# Patient Record
Sex: Male | Born: 1970 | Race: White | Hispanic: No | Marital: Married | Smoking: Former smoker
Health system: Southern US, Community
[De-identification: ages and names within clinical notes are randomized; demographics above are authoritative.]

## PROBLEM LIST (undated history)

## (undated) DIAGNOSIS — I10 Essential (primary) hypertension: Secondary | ICD-10-CM

## (undated) DIAGNOSIS — N189 Chronic kidney disease, unspecified: Secondary | ICD-10-CM

## (undated) DIAGNOSIS — K760 Fatty (change of) liver, not elsewhere classified: Secondary | ICD-10-CM

## (undated) DIAGNOSIS — T7840XA Allergy, unspecified, initial encounter: Secondary | ICD-10-CM

## (undated) DIAGNOSIS — E785 Hyperlipidemia, unspecified: Secondary | ICD-10-CM

## (undated) DIAGNOSIS — E119 Type 2 diabetes mellitus without complications: Secondary | ICD-10-CM

## (undated) DIAGNOSIS — J9601 Acute respiratory failure with hypoxia: Secondary | ICD-10-CM

## (undated) DIAGNOSIS — U071 COVID-19: Secondary | ICD-10-CM

## (undated) DIAGNOSIS — K859 Acute pancreatitis without necrosis or infection, unspecified: Secondary | ICD-10-CM

## (undated) HISTORY — DX: COVID-19: U07.1

## (undated) HISTORY — DX: Hyperlipidemia, unspecified: E78.5

## (undated) HISTORY — DX: Type 2 diabetes mellitus without complications: E11.9

## (undated) HISTORY — DX: Acute respiratory failure with hypoxia: J96.01

## (undated) HISTORY — DX: Acute pancreatitis without necrosis or infection, unspecified: K85.90

## (undated) HISTORY — DX: Essential (primary) hypertension: I10

## (undated) HISTORY — DX: Fatty (change of) liver, not elsewhere classified: K76.0

## (undated) HISTORY — DX: Allergy, unspecified, initial encounter: T78.40XA

---

## 2009-09-04 ENCOUNTER — Encounter: Payer: Self-pay | Admitting: Internal Medicine

## 2009-09-19 HISTORY — PX: CHOLECYSTECTOMY, LAPAROSCOPIC: SHX56

## 2009-09-24 ENCOUNTER — Encounter: Payer: Self-pay | Admitting: Internal Medicine

## 2009-09-30 ENCOUNTER — Encounter: Payer: Self-pay | Admitting: Internal Medicine

## 2009-10-01 ENCOUNTER — Encounter (INDEPENDENT_AMBULATORY_CARE_PROVIDER_SITE_OTHER): Payer: Self-pay | Admitting: *Deleted

## 2009-10-30 ENCOUNTER — Ambulatory Visit: Payer: Self-pay | Admitting: Internal Medicine

## 2009-10-30 DIAGNOSIS — R740 Nonspecific elevation of levels of transaminase and lactic acid dehydrogenase [LDH]: Secondary | ICD-10-CM

## 2010-01-27 LAB — CONVERTED CEMR LAB
ALT: 65 units/L — ABNORMAL HIGH (ref 0–53)
AST: 29 units/L (ref 0–37)
Albumin: 3.9 g/dL (ref 3.5–5.2)
Alkaline Phosphatase: 63 units/L (ref 39–117)
Bilirubin, Direct: 0.1 mg/dL (ref 0.0–0.3)
Total Bilirubin: 0.4 mg/dL (ref 0.3–1.2)
Total Protein: 7 g/dL (ref 6.0–8.3)

## 2010-09-19 DIAGNOSIS — I10 Essential (primary) hypertension: Secondary | ICD-10-CM

## 2010-09-19 DIAGNOSIS — E119 Type 2 diabetes mellitus without complications: Secondary | ICD-10-CM

## 2010-09-19 HISTORY — DX: Essential (primary) hypertension: I10

## 2010-09-19 HISTORY — DX: Type 2 diabetes mellitus without complications: E11.9

## 2010-10-19 NOTE — Assessment & Plan Note (Signed)
Summary: ELEVATED LFT'S--CH.   History of Present Illness Visit Type: Initial Consult Primary GI MD: Stan Head MD [facility] Primary Provider: Aleatha Borer, MD Chief Complaint: Patient here for further evaluation of increased Liver function tests. He denies any problems. History of Present Illness:   40 year old white man with a gastrointestinal illness before Christmas, 2010. He sought evaluation due to persistent symptoms and [facility] to [facility] The first abnormal transaminases were seen on September 04, 2009 with ALT 78 and a normal AST of 37. Subsequent testing demonstrated AST 57 and ALT 150 on September 24, 2009. On September 30, 2009 hepatitis panel for acute infectious hepatitis with a antibody IgM, the surface antigen, B. core antibody IgM, and C. virus antibodies were all negative. He has no known history of prior abnormal LFTs. He does the a large amount of fast food. Alcohol use is characterized as rare. He is not on any supplements to body build such as steroids and there is no family history of liver disease. He knows of no known prior liver problems. He has mild fasting glucose increased but is not a diabetic.spell No history of blood transfusions. He last donated blood at age 32. No history of needle sticks or injection use. The gastrointestinal viarl sxs of nausea, vomiting and diarrhea have resolved. he does not have nor did he have significant myalgias. All other GI ROS negative.          Preventive Screening-Counseling & Management  Alcohol-Tobacco     Smoking Status: current      Drug Use:  no.      Current Medications (verified): 1)  Multivitamins  Tabs (Multiple Vitamin) .... Take 1 Tablet By Mouth As Needed  Allergies (verified): No Known Drug Allergies  Past History:  Past Medical History: Elevated Liver Function Tests  Past Surgical History: Unremarkable  Family History: Family History of Breast Cancer: Mother, Aunt No  FH of Colon Cancer: Family History of Heart Disease: Uncle  Social History: Occupation: Engineer, civil (consulting) - trucking Patient currently smokes. -3/4 ppd Alcohol Use - yes-occasional - rare Daily Caffeine Use-2 cups daily Illicit Drug Use - no Smoking Status:  current Drug Use:  no  Review of Systems       The patient complains of allergy/sinus.         All other ROS negative except as per HPI.   Vital Signs:  Patient profile:   40 year old male Height:      74 inches Weight:      241.50 pounds BMI:     31.12 BSA:     2.36 Pulse rate:   76 / minute Pulse rhythm:   regular BP sitting:   150 / 90  (right arm)  Vitals Entered By: Hortense Ramal CMA Duncan Dull) (October 30, 2009 11:02 AM)  Physical Exam  General:  Well developed, well nourished, no acute distress. Eyes:  PERRLA, no icterus. Mouth:  No deformity or lesions, dentition normal. Lungs:  Clear throughout to auscultation. Heart:  Regular rate and rhythm; no murmurs, rubs,  or bruits. Abdomen:  Soft, nontender and nondistended. No masses, hepatosplenomegaly or hernias noted. Normal bowel sounds. Extremities:  No clubbing, cyanosis, edema or deformities noted. Skin:  Intact without significant lesions or rashes. Cervical Nodes:  No significant cervical or supraclavicular adenopathy.  Psych:  Alert and cooperative. Normal mood and affect.   Impression & Recommendations:  Problem # 1:  TRANSAMINASES, SERUM, ELEVATED (ICD-790.4) Assessment New The etiology  is not clear at this time. These are minor evaluations with a maximum of 3 times abnormal on January 7. One real possibility is excessive fast food intake which has been linked to abnormal transaminases. It does not appear related to alcohol. Acute hepatitis A, B, and C are excluded reliably at this time. He is mildly obese and has a mild elevation of fasting glucose so fatty liver problems could be part of this. I think reassessment of his LFTs only it is prudent at this time.  Further plans pending those results.  He will reduce his intake a fast food early try to do so. Orders: TLB-Hepatic/Liver Function Pnl ([id number])  Problem # 2:  OBESITY (ICD-278.00) Assessment: New he has mild obesity with a BMI 31. He has been appropriately counseled to lose weight by his primary care physician and we revisited and reemphasized that today.  Patient Instructions: 1)  Please go to the basement to have your lab tests drawn today.  2)  We will call you with these results and further follow up. 3)  Please reduce or eliminate fast food from your diet. 4)  Copy sent to : Derrek Gu, MD 5)  The medication list was reviewed and reconciled.  All changed / newly prescribed medications were explained.  A complete medication list was provided to the patient / caregiver. Patient: Gregory Jacobs Note: All result statuses are Final unless otherwise noted.  Tests: (1) Hepatic/Liver Function Panel (HEPATIC)   Total Bilirubin           0.4 mg/dL                   0.4-5.4   Direct Bilirubin          0.1 mg/dL                   0.9-8.1   Alkaline Phosphatase      63 U/L                      39-117   AST                       29 U/L                      0-37   ALT                  [H]  65 U/L                      0-53   Total Protein             7.0 g/dL                    1.9-1.4   Albumin                   3.9 g/dL                    7.8-2.9  Note: An exclamation mark (!) indicates a result that was not dispersed into the flowsheet. Document Creation Date: 10/30/2009 1:22 PM   Significantly better LFT's. Will recheck in 6 weeks prior to any further work-up.

## 2010-10-19 NOTE — Letter (Signed)
Summary: New Patient letter  [facility]  [address] [place], [place] [address]   Phone: [phone]  Fax: [phone]       10/01/2009 MRN: [id number]  Gregory Jacobs [address] [place], [place]  [address]  Dear Gregory Jacobs,  Welcome to the Gastroenterology Division at [facility].    You are scheduled to see Dr.  Leone Payor on 10-30-09 at 10:30a.m. on the 3rd floor at [facility], [address]  We ask that you try to arrive at our office 15 minutes prior to your appointment time to allow for check-in.  We would like you to complete the enclosed self-administered evaluation form prior to your visit and bring it with you on the day of your appointment.  We will review it with you.  Also, please bring a complete list of all your medications or, if you prefer, bring the medication bottles and we will list them.  Please bring your insurance card so that we may make a copy of it.  If your insurance requires a referral to see a specialist, please bring your referral form from your primary care physician.  Co-payments are due at the time of your visit and may be paid by cash, check or credit card.     Your office visit will consist of a consult with your physician (includes a physical exam), any laboratory testing he/she may order, scheduling of any necessary diagnostic testing (e.g. x-ray, ultrasound, CT-scan), and scheduling of a procedure (e.g. Endoscopy, Colonoscopy) if required.  Please allow enough time on your schedule to allow for any/all of these possibilities.    If you cannot keep your appointment, please call [phone] to cancel or reschedule prior to your appointment date.  This allows [place] the opportunity to schedule an appointment for another patient in need of care.  If you do not cancel or reschedule by 5 p.m. the business day prior to your appointment date, you will be charged a $50.00 late cancellation/no-show fee.    Thank you for choosing  [facility] for your medical needs.  We appreciate the opportunity to care for you.  Please visit [place] at our website  to learn more about our practice.                     Sincerely,                                                             The Gastroenterology Division

## 2010-10-19 NOTE — Letter (Signed)
Summary: [facility]  [facility]   Imported By: Sherian Rein 11/07/2009 08:54:19  _____________________________________________________________________  External Attachment:    Type:   Image     Comment:   External Document

## 2014-09-19 HISTORY — PX: VASECTOMY: SHX75

## 2017-08-25 ENCOUNTER — Ambulatory Visit (INDEPENDENT_AMBULATORY_CARE_PROVIDER_SITE_OTHER): Payer: Managed Care, Other (non HMO) | Admitting: Internal Medicine

## 2017-08-25 ENCOUNTER — Encounter: Payer: Self-pay | Admitting: Internal Medicine

## 2017-08-25 VITALS — BP 122/84 | HR 76 | Temp 97.7°F | Resp 16 | Ht 74.25 in | Wt 239.8 lb

## 2017-08-25 DIAGNOSIS — K760 Fatty (change of) liver, not elsewhere classified: Secondary | ICD-10-CM | POA: Diagnosis not present

## 2017-08-25 DIAGNOSIS — E119 Type 2 diabetes mellitus without complications: Secondary | ICD-10-CM | POA: Diagnosis not present

## 2017-08-25 DIAGNOSIS — E559 Vitamin D deficiency, unspecified: Secondary | ICD-10-CM

## 2017-08-25 DIAGNOSIS — B351 Tinea unguium: Secondary | ICD-10-CM

## 2017-08-25 DIAGNOSIS — Z79899 Other long term (current) drug therapy: Secondary | ICD-10-CM | POA: Diagnosis not present

## 2017-08-25 DIAGNOSIS — E1169 Type 2 diabetes mellitus with other specified complication: Secondary | ICD-10-CM | POA: Insufficient documentation

## 2017-08-25 DIAGNOSIS — E782 Mixed hyperlipidemia: Secondary | ICD-10-CM | POA: Diagnosis not present

## 2017-08-25 DIAGNOSIS — B353 Tinea pedis: Secondary | ICD-10-CM

## 2017-08-25 DIAGNOSIS — I10 Essential (primary) hypertension: Secondary | ICD-10-CM

## 2017-08-25 DIAGNOSIS — E1129 Type 2 diabetes mellitus with other diabetic kidney complication: Secondary | ICD-10-CM | POA: Insufficient documentation

## 2017-08-25 DIAGNOSIS — E785 Hyperlipidemia, unspecified: Secondary | ICD-10-CM

## 2017-08-25 MED ORDER — METFORMIN HCL ER 500 MG PO TB24: ORAL_TABLET | ORAL | 1 refills | Status: DC

## 2017-08-25 MED ORDER — TERBINAFINE HCL 250 MG PO TABS: 250.0000 mg | ORAL_TABLET | Freq: Every day | ORAL | 0 refills | Status: DC

## 2017-08-25 MED ORDER — TRAZODONE HCL 150 MG PO TABS: ORAL_TABLET | ORAL | 1 refills | Status: DC

## 2017-08-25 MED ORDER — LISINOPRIL 10 MG PO TABS: ORAL_TABLET | ORAL | 1 refills | Status: DC

## 2017-08-25 NOTE — Progress Notes (Signed)
This very nice 46 y.o. MWM  presents presents as a new to establish  follow up with hx/o  Hypertension, Hyperlipidemia, T2_DM and Vitamin D Deficiency.      Patient is treated for HTN  (2012) & BP has been controlled at home. Today's BP is at goal - 122/84. Patient has had no complaints of any cardiac type chest pain, palpitations, dyspnea / orthopnea / PND, dizziness, claudication, or dependent edema.     Patient relates hx/o Hyperlipidemia  controlled with diet.      Also, the patient has history of T2_NIDDM (2012) and is on Metformin 1,000 mg daily and does report sx's of mild GI intolerance. He denies symptoms of reactive hypoglycemia, diabetic polys, paresthesias or visual blurring. Quick A1c done this Am for a CLD exam was 8.1%. He admits not monitoring CBG's.      Further, the patient is anticipated to have a Vitamin D Deficiency as he does not supplement Vitamin D.  Current Meds  (1) Metformin 1,000 mg Tab x 1 qam  (2) Lisinopril 10 mg x 1 tab qam  (3) Trazadone 50 mg x 1 tab qhs - occasionally   NKA  PMHx:  HTN, HLD, Insomnia, Prostatitis  Immunization History  Administered Date(s) Administered  . Td 2013-06-11   FHx:    (+) Mo died 47 yo fr Breast Cancer, Sister died 63 from suicide. 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   Systems Review:  Constitutional: Denies fever, chills, wt changes, headaches, insomnia, fatigue, night sweats, change in appetite. Eyes: Denies redness, blurred vision, diplopia, discharge, itchy, watery eyes.  ENT: Denies discharge, congestion, post nasal drip, epistaxis, sore throat, earache, hearing loss, dental pain, tinnitus, vertigo, sinus pain, snoring.  CV: Denies chest pain, palpitations, irregular heartbeat, syncope, dyspnea, diaphoresis, orthopnea, PND, claudication or edema. Respiratory: denies cough, dyspnea, DOE, pleurisy, hoarseness, laryngitis, wheezing.  Gastrointestinal: Denies dysphagia,  odynophagia, heartburn, reflux, water brash, abdominal pain or cramps, nausea, vomiting, bloating, diarrhea, constipation, hematemesis, melena, hematochezia  or hemorrhoids. Genitourinary: Denies dysuria, frequency, urgency, nocturia, hesitancy, discharge, hematuria or flank pain. Musculoskeletal: Denies arthralgias, myalgias, stiffness, jt. swelling, pain, limping or strain/sprain.  Skin: Denies pruritus, rash, hives, warts, acne, eczema or change in skin lesion(s). Neuro: No weakness, tremor, incoordination, spasms, paresthesia or pain. Psychiatric: Denies confusion, memory loss or sensory loss. Endo: Denies change in weight, skin or hair change.  Heme/Lymph: No excessive bleeding, bruising or enlarged lymph nodes.  Physical Exam  BP 122/84   Pulse 76   Temp 97.7 F (36.5 C)   Resp 16   Ht 6' 2.25" (1.886 m)   Wt 239 lb 12.8 oz (108.8 kg)   BMI 30.58 kg/m   Appears well nourished, well groomed  and in no distress.  Eyes: PERRLA, EOMs, conjunctiva no swelling or erythema. Sinuses: No frontal/maxillary tenderness ENT/Mouth: EAC's clear, TM's nl w/o erythema, bulging. Nares clear w/o erythema, swelling, exudates. Oropharynx clear without erythema or exudates. Oral hygiene is good. Tongue normal, non obstructing. Hearing intact.  Neck: Supple. Thyroid nl. Car 2+/2+ without bruits, nodes or JVD. Chest: Respirations nl with BS clear & equal w/o rales, rhonchi, wheezing or stridor.  Cor: Heart sounds normal w/ regular rate and rhythm without sig. murmurs, gallops, clicks or rubs. Peripheral pulses normal and equal  without edema.  Abdomen: Soft & bowel sounds normal. Non-tender w/o guarding, rebound, hernias, masses or organomegaly.  Lymphatics: Unremarkable.  Musculoskeletal: Full ROM all peripheral extremities, joint stability, 5/5 strength and normal gait.  Skin: Warm, dry without exposed rashes, lesions or ecchymosis apparent. Has moderate t.pedis changes and dystrophic chalky Lt. 1st  toenail  Neuro: Cranial nerves intact, reflexes equal bilaterally. Sensory-motor testing grossly intact. Tendon reflexes grossly intact.  Pysch: Alert & oriented x 3.  Insight and judgement nl & appropriate. No ideations.  Assessment and Plan:  1. Essential hypertension  - Continue medication, monitor blood pressure at home.  - Continue DASH diet. Reminder to go to the ER if any CP,  SOB, nausea, dizziness, severe HA, changes vision/speech  - lisinopril (PRINIVIL,ZESTRIL) 10 MG tablet; Take 1 tablet daily for BP  Dispense: 90 tablet; Refill: 1 - CBC with Differential/Platelet; Future - BASIC METABOLIC PANEL WITH GFR; Future - Magnesium; Future - TSH; Future  2. Hyperlipidemia, mixed  - Continue diet/meds, exercise,& lifestyle modifications.  - Continue monitor periodic cholesterol/liver & renal functions   - Hepatic function panel; Future - Lipid panel; Future - TSH; Future  3. Diabetes mellitus without complication ([place])  - Continue diet, exercise, lifestyle modifications.  - Monitor appropriate labs.  - metFORMIN (GLUCOPHAGE XR) 500 MG 24 hr tablet; Take 2 tablets 2 x / day for Diabetes  Dispense: 360 tablet; Refill: 1  - Hemoglobin A1c; Future - Insulin, random; Future  4. Vitamin D deficiency  - VITAMIN D 25 Hydroxy   - Begin  Supplementation pending blood level.  5. Medication management  - traZODone (DESYREL) 150 MG tablet; Take 1/2 to 1 tablet 1 hour before sleep  Dispense: 90 tablet; Refill: 1 - CBC with Differential/Platelet; Future - BASIC METABOLIC PANEL WITH GFR; Future - Hepatic function panel; Future - Magnesium; Future - Lipid panel; Future - TSH; Future - Hemoglobin A1c; Future - Insulin, random; Future - VITAMIN D 25 Hydroxy; Future  6. Tinea pedis of both feet  - terbinafine (LAMISIL) 250 MG tablet; Take 1 tablet (250 mg total) by mouth daily.  Dispense: 90 tablet; Refill: 0  7. Onychomycosis of toenail  - terbinafine (LAMISIL) 250 MG  tablet; Take 1 tablet (250 mg total) by mouth daily.  Dispense: 90 tablet; Refill: 0       Discussed  regular exercise, BP monitoring, weight control to achieve/maintain BMI less than 25 and discussed med and SE's. Recommended labs to assess and monitor clinical status with further disposition pending results of labs. Over 30 minutes of exam, counseling, chart review was performed.

## 2017-08-25 NOTE — Patient Instructions (Addendum)
Preventive Care for Adults  Recommend low dose 81 mg daily   A healthy lifestyle and preventive care can promote health and wellness. Preventive health guidelines for men include the following key practices:  A routine yearly physical is a good way to check with your health care provider about your health and preventative screening. It is a chance to share any concerns and updates on your health and to receive a thorough exam.  Visit your dentist for a routine exam and preventative care every 6 months. Brush your teeth twice a day and floss once a day. Good oral hygiene prevents tooth decay and gum disease.  The frequency of eye exams is based on your age, health, family medical history, use of contact lenses, and other factors. Follow your health care provider's recommendations for frequency of eye exams.  Eat a healthy diet. Foods such as vegetables, fruits, whole grains, low-fat dairy products, and lean protein foods contain the nutrients you need without too many calories. Decrease your intake of foods high in solid fats, added sugars, and salt. Eat the right amount of calories for you.Get information about a proper diet from your health care provider, if necessary.  Regular physical exercise is one of the most important things you can do for your health. Most adults should get at least 150 minutes of moderate-intensity exercise (any activity that increases your heart rate and causes you to sweat) each week. In addition, most adults need muscle-strengthening exercises on 2 or more days a week.  Maintain a healthy weight. The body mass index (BMI) is a screening tool to identify possible weight problems. It provides an estimate of body fat based on height and weight. Your health care provider can find your BMI and can help you achieve or maintain a healthy weight.For adults 20 years and older:  A BMI below 18.5 is considered underweight.  A BMI of 18.5 to 24.9 is normal.  A BMI of 25 to  29.9 is considered overweight.  A BMI of 30 and above is considered obese.  Maintain normal blood lipids and cholesterol levels by exercising and minimizing your intake of saturated fat. Eat a balanced diet with plenty of fruit and vegetables. Blood tests for lipids and cholesterol should begin at age 82 and be repeated every 5 years. If your lipid or cholesterol levels are high, you are over 50, or you are at high risk for heart disease, you may need your cholesterol levels checked more frequently.Ongoing high lipid and cholesterol levels should be treated with medicines if diet and exercise are not working.  If you smoke, find out from your health care provider how to quit. If you do not use tobacco, do not start.  Lung cancer screening is recommended for adults aged 5-80 years who are at high risk for developing lung cancer because of a history of smoking. A yearly low-dose CT scan of the lungs is recommended for people who have at least a 30-pack-year history of smoking and are a current smoker or have quit within the past 15 years. A pack year of smoking is smoking an average of 1 pack of cigarettes a day for 1 year (for example: 1 pack a day for 30 years or 2 packs a day for 15 years). Yearly screening should continue until the smoker has stopped smoking for at least 15 years. Yearly screening should be stopped for people who develop a health problem that would prevent them from having lung cancer treatment.  If you  choose to drink alcohol, do not have more than 2 drinks per day. One drink is considered to be 12 ounces (355 mL) of beer, 5 ounces (148 mL) of wine, or 1.5 ounces (44 mL) of liquor.  Avoid use of street drugs. Do not share needles with anyone. Ask for help if you need support or instructions about stopping the use of drugs.  High blood pressure causes heart disease and increases the risk of stroke. Your blood pressure should be checked at least every 1-2 years. Ongoing high blood  pressure should be treated with medicines, if weight loss and exercise are not effective.  If you are 36-15 years old, ask your health care provider if you should take aspirin to prevent heart disease.  Diabetes screening involves taking a blood sample to check your fasting blood sugar level. This should be done once every 3 years, after age 65, if you are within normal weight and without risk factors for diabetes. Testing should be considered at a younger age or be carried out more frequently if you are overweight and have at least 1 risk factor for diabetes.  Colorectal cancer can be detected and often prevented. Most routine colorectal cancer screening begins at the age of 80 and continues through age 89. However, your health care provider may recommend screening at an earlier age if you have risk factors for colon cancer. On a yearly basis, your health care provider may provide home test kits to check for hidden blood in the stool. Use of a small camera at the end of a tube to directly examine the colon (sigmoidoscopy or colonoscopy) can detect the earliest forms of colorectal cancer. Talk to your health care provider about this at age 64, when routine screening begins. Direct exam of the colon should be repeated every 5-10 years through age 69, unless early forms of precancerous polyps or small growths are found.   Talk with your health care provider about prostate cancer screening.  Testicular cancer screening isrecommended for adult males. Screening includes self-exam, a health care provider exam, and other screening tests. Consult with your health care provider about any symptoms you have or any concerns you have about testicular cancer.  Use sunscreen. Apply sunscreen liberally and repeatedly throughout the day. You should seek shade when your shadow is shorter than you. Protect yourself by wearing long sleeves, pants, a wide-brimmed hat, and sunglasses year round, whenever you are  outdoors.  Once a month, do a whole-body skin exam, using a mirror to look at the skin on your back. Tell your health care provider about new moles, moles that have irregular borders, moles that are larger than a pencil eraser, or moles that have changed in shape or color.  Stay current with required vaccines (immunizations).  Influenza vaccine. All adults should be immunized every year.  Tetanus, diphtheria, and acellular pertussis (Td, Tdap) vaccine. An adult who has not previously received Tdap or who does not know his vaccine status should receive 1 dose of Tdap. This initial dose should be followed by tetanus and diphtheria toxoids (Td) booster doses every 10 years. Adults with an unknown or incomplete history of completing a 3-dose immunization series with Td-containing vaccines should begin or complete a primary immunization series including a Tdap dose. Adults should receive a Td booster every 10 years.  Varicella vaccine. An adult without evidence of immunity to varicella should receive 2 doses or a second dose if he has previously received 1 dose.  Human papillomavirus (HPV)  vaccine. Males aged 98-21 years who have not received the vaccine previously should receive the 3-dose series. Males aged 22-26 years may be immunized. Immunization is recommended through the age of 38 years for any male who has sex with males and did not get any or all doses earlier. Immunization is recommended for any person with an immunocompromised condition through the age of 71 years if he did not get any or all doses earlier. During the 3-dose series, the second dose should be obtained 4-8 weeks after the first dose. The third dose should be obtained 24 weeks after the first dose and 16 weeks after the second dose.  Zoster vaccine. One dose is recommended for adults aged 41 years or older unless certain conditions are present.    PREVNAR  - Pneumococcal 13-valent conjugate (PCV13) vaccine. When indicated, a  person who is uncertain of his immunization history and has no record of immunization should receive the PCV13 vaccine. An adult aged 31 years or older who has certain medical conditions and has not been previously immunized should receive 1 dose of PCV13 vaccine. This PCV13 should be followed with a dose of pneumococcal polysaccharide (PPSV23) vaccine. The PPSV23 vaccine dose should be obtained at least 8 weeks after the dose of PCV13 vaccine. An adult aged 11 years or older who has certain medical conditions and previously received 1 or more doses of PPSV23 vaccine should receive 1 dose of PCV13. The PCV13 vaccine dose should be obtained 1 or more years after the last PPSV23 vaccine dose.    PNEUMOVAX - Pneumococcal polysaccharide (PPSV23) vaccine. When PCV13 is also indicated, PCV13 should be obtained first. All adults aged 79 years and older should be immunized. An adult younger than age 54 years who has certain medical conditions should be immunized. Any person who resides in a nursing home or long-term care facility should be immunized. An adult smoker should be immunized. People with an immunocompromised condition and certain other conditions should receive both PCV13 and PPSV23 vaccines. People with human immunodeficiency virus (HIV) infection should be immunized as soon as possible after diagnosis. Immunization during chemotherapy or radiation therapy should be avoided. Routine use of PPSV23 vaccine is not recommended for American Indians, [place] Natives, or people younger than 65 years unless there are medical conditions that require PPSV23 vaccine. When indicated, people who have unknown immunization and have no record of immunization should receive PPSV23 vaccine. One-time revaccination 5 years after the first dose of PPSV23 is recommended for people aged 19-64 years who have chronic kidney failure, nephrotic syndrome, asplenia, or immunocompromised conditions. People who received 1-2 doses of PPSV23  before age 56 years should receive another dose of PPSV23 vaccine at age 57 years or later if at least 5 years have passed since the previous dose. Doses of PPSV23 are not needed for people immunized with PPSV23 at or after age 36 years.    Hepatitis A vaccine. Adults who wish to be protected from this disease, have certain high-risk conditions, work with hepatitis A-infected animals, work in hepatitis A research labs, or travel to or work in countries with a high rate of hepatitis A should be immunized. Adults who were previously unvaccinated and who anticipate close contact with an international adoptee during the first 60 days after arrival in the [place] from a country with a high rate of hepatitis A should be immunized.    Hepatitis B vaccine. Adults should be immunized if they wish to be protected from this disease,  have certain high-risk conditions, may be exposed to blood or other infectious body fluids, are household contacts or sex partners of hepatitis B positive people, are clients or workers in certain care facilities, or travel to or work in countries with a high rate of hepatitis B.   Preventive Service / Frequency   Ages 16 to 75  Blood pressure check.  Lipid and cholesterol check  Lung cancer screening. / Every year if you are aged 68-80 years and have a 30-pack-year history of smoking and currently smoke or have quit within the past 15 years. Yearly screening is stopped once you have quit smoking for at least 15 years or develop a health problem that would prevent you from having lung cancer treatment.  Fecal occult blood test (FOBT) of stool. / Every year beginning at age 36 and continuing until age 37. You may not have to do this test if you get a colonoscopy every 10 years.  Flexible sigmoidoscopy** or colonoscopy.** / Every 5 years for a flexible sigmoidoscopy or every 10 years for a colonoscopy beginning at age 71 and continuing until age 19. Screening for  abdominal aortic aneurysm (AAA)  by ultrasound is recommended for people who have history of high blood pressure or who are current or former smokers. +++++++++++ Recommend Adult Low Dose Aspirin or  coated  Aspirin 81 mg daily  To reduce risk of Colon Cancer 20 %,  Skin Cancer 26 % ,  Melanoma 46%  and  Pancreatic cancer 60% ++++++++++++++++++++ Vitamin D goal  is between 70-100.  Please make sure that you are taking your Vitamin D as directed.  It is very important as a natural anti-inflammatory  helping hair, skin, and nails, as well as reducing stroke and heart attack risk.  It helps your bones and helps with mood. It also decreases numerous cancer risks so please take it as directed.  Low Vit D is associated with a 200-300% higher risk for CANCER  and 200-300% higher risk for HEART   ATTACK  &  STROKE.   .....................................Marland Kitchen It is also associated with higher death rate at younger ages,  autoimmune diseases like Rheumatoid arthritis, Lupus, Multiple Sclerosis.    Also many other serious conditions, like depression, Alzheimer's Dementia, infertility, muscle aches, fatigue, fibromyalgia - just to name a few. +++++++++++++++++++++ Recommend the book "The END of DIETING" by Dr Excell Seltzer  & the book "The END of DIABETES " by Dr Excell Seltzer At [email or web address] - get book & Audio CD's    Being diabetic has a  300% increased risk for heart attack, stroke, cancer, and alzheimer- type vascular dementia. It is very important that you work harder with diet by avoiding all foods that are white. Avoid white rice (brown & wild rice is OK), white potatoes (sweetpotatoes in moderation is OK), White bread or wheat bread or anything made out of white flour like bagels, donuts, rolls, buns, biscuits, cakes, pastries, cookies, pizza crust, and pasta (made from white flour & egg whites) - vegetarian pasta or spinach or wheat pasta is OK. Multigrain breads like Arnold's or [employer],  or multigrain sandwich thins or flatbreads.  Diet, exercise and weight loss can reverse and cure diabetes in the early stages.  Diet, exercise and weight loss is very important in the control and prevention of complications of diabetes which affects every system in your body, ie. Brain - dementia/stroke, eyes - glaucoma/blindness, heart - heart attack/heart failure, kidneys - dialysis, stomach - gastric paralysis, intestines -  malabsorption, nerves - severe painful neuritis, circulation - gangrene & loss of a leg(s), and finally cancer and Alzheimers.    I recommend avoid fried & greasy foods,  sweets/candy, white rice (brown or wild rice or Quinoa is OK), white potatoes (sweet potatoes are OK) - anything made from white flour - bagels, doughnuts, rolls, buns, biscuits,white and wheat breads, pizza crust and traditional pasta made of white flour & egg white(vegetarian pasta or spinach or wheat pasta is OK).  Multi-grain bread is OK - like multi-grain flat bread or sandwich thins. Avoid alcohol in excess. Exercise is also important.    Eat all the vegetables you want - avoid meat, especially red meat and dairy - especially cheese.  Cheese is the most concentrated form of trans-fats which is the worst thing to clog up our arteries. Veggie cheese is OK which can be found in the fresh produce section at [employer] or [employer] or [employer]  ++++++++++++++++++++++ DASH Eating Plan  DASH stands for "Dietary Approaches to Stop Hypertension."   The DASH eating plan is a healthy eating plan that has been shown to reduce high blood pressure (hypertension). Additional health benefits may include reducing the risk of type 2 diabetes mellitus, heart disease, and stroke. The DASH eating plan may also help with weight loss. WHAT DO I NEED TO KNOW ABOUT THE DASH EATING PLAN? For the DASH eating plan, you will follow these general guidelines:  Choose foods with a percent daily value for sodium of less than 5% (as  listed on the food label).  Use salt-free seasonings or herbs instead of table salt or sea salt.  Check with your health care provider or pharmacist before using salt substitutes.  Eat lower-sodium products, often labeled as "lower sodium" or "no salt added."  Eat fresh foods.  Eat more vegetables, fruits, and low-fat dairy products.  Choose whole grains. Look for the word "whole" as the first word in the ingredient list.  Choose fish   Limit sweets, desserts, sugars, and sugary drinks.  Choose heart-healthy fats.  Eat veggie cheese   Eat more home-cooked food and less restaurant, buffet, and fast food.  Limit fried foods.  Cook foods using methods other than frying.  Limit canned vegetables. If you do use them, rinse them well to decrease the sodium.  When eating at a restaurant, ask that your food be prepared with less salt, or no salt if possible.                      WHAT FOODS CAN I EAT? Read Dr Fara Olden Fuhrman's books on The End of Dieting & The End of Diabetes  Grains Whole grain or whole wheat bread. Brown rice. Whole grain or whole wheat pasta. Quinoa, bulgur, and whole grain cereals. Low-sodium cereals. Corn or whole wheat flour tortillas. Whole grain cornbread. Whole grain crackers. Low-sodium crackers.  Vegetables Fresh or frozen vegetables (raw, steamed, roasted, or grilled). Low-sodium or reduced-sodium tomato and vegetable juices. Low-sodium or reduced-sodium tomato sauce and paste. Low-sodium or reduced-sodium canned vegetables.   Fruits All fresh, canned (in natural juice), or frozen fruits.  Protein Products  All fish and seafood.  Dried beans, peas, or lentils. Unsalted nuts and seeds. Unsalted canned beans.  Dairy Low-fat dairy products, such as skim or 1% milk, 2% or reduced-fat cheeses, low-fat ricotta or cottage cheese, or plain low-fat yogurt. Low-sodium or reduced-sodium cheeses.  Fats and Oils Tub margarines without trans fats. Light or  reduced-fat  mayonnaise and salad dressings (reduced sodium). Avocado. Safflower, olive, or canola oils. Natural peanut or almond butter.  Other Unsalted popcorn and pretzels. The items listed above may not be a complete list of recommended foods or beverages. Contact your dietitian for more options.  +++++++++++++++++++  WHAT FOODS ARE NOT RECOMMENDED? Grains/ White flour or wheat flour White bread. White pasta. White rice. Refined cornbread. Bagels and croissants. Crackers that contain trans fat.  Vegetables  Creamed or fried vegetables. Vegetables in a . Regular canned vegetables. Regular canned tomato sauce and paste. Regular tomato and vegetable juices.  Fruits Dried fruits. Canned fruit in light or heavy syrup. Fruit juice.  Meat and Other Protein Products Meat in general - RED meat & White meat.  Fatty cuts of meat. Ribs, chicken wings, all processed meats as bacon, sausage, bologna, salami, fatback, hot dogs, bratwurst and packaged luncheon meats.  Dairy Whole or 2% milk, cream, half-and-half, and cream cheese. Whole-fat or sweetened yogurt. Full-fat cheeses or blue cheese. Non-dairy creamers and whipped toppings. Processed cheese, cheese spreads, or cheese curds.  Condiments Onion and garlic salt, seasoned salt, table salt, and sea salt. Canned and packaged gravies. Worcestershire sauce. Tartar sauce. Barbecue sauce. Teriyaki sauce. Soy sauce, including reduced sodium. Steak sauce. Fish sauce. Oyster sauce. Cocktail sauce. Horseradish. Ketchup and mustard. Meat flavorings and tenderizers. Bouillon cubes. Hot sauce. Tabasco sauce. Marinades. Taco seasonings. Relishes.  Fats and Oils Butter, stick margarine, lard, shortening and bacon fat. Coconut, palm kernel, or palm oils. Regular salad dressings.  Pickles and olives. Salted popcorn and pretzels.  The items listed above may not be a complete list of foods and beverages to avoid.

## 2017-09-20 LAB — LIPID PANEL
Cholesterol: 185 mg/dL (ref <200)
HDL: 31 mg/dL — ABNORMAL LOW (ref >40)
LDL Cholesterol (Calc): 120 mg/dL (calc) — ABNORMAL HIGH
Non-HDL Cholesterol (Calc): 154 mg/dL (calc) — ABNORMAL HIGH (ref <130)
Total CHOL/HDL Ratio: 6 (calc) — ABNORMAL HIGH (ref <5.0)
Triglycerides: 219 mg/dL — ABNORMAL HIGH (ref <150)

## 2017-09-20 LAB — HEMOGLOBIN A1C
Hgb A1c MFr Bld: 8.1 % of total Hgb — ABNORMAL HIGH (ref <5.7)
Mean Plasma Glucose: 186 (calc)
eAG (mmol/L): 10.3 (calc)

## 2017-09-20 LAB — CBC WITH DIFFERENTIAL/PLATELET
Basophils Absolute: 78 cells/uL (ref 0–200)
Basophils Relative: 0.9 %
Eosinophils Absolute: 348 cells/uL (ref 15–500)
Eosinophils Relative: 4 %
HCT: 49.5 % (ref 38.5–50.0)
Hemoglobin: 17.2 g/dL — ABNORMAL HIGH (ref 13.2–17.1)
Lymphs Abs: 2723 cells/uL (ref 850–3900)
MCH: 29.7 pg (ref 27.0–33.0)
MCHC: 34.7 g/dL (ref 32.0–36.0)
MCV: 85.3 fL (ref 80.0–100.0)
MPV: 10.8 fL (ref 7.5–12.5)
Monocytes Relative: 6 %
Neutro Abs: 5029 cells/uL (ref 1500–7800)
Neutrophils Relative %: 57.8 %
Platelets: 235 10*3/uL (ref 140–400)
RBC: 5.8 10*6/uL (ref 4.20–5.80)
RDW: 12.3 % (ref 11.0–15.0)
Total Lymphocyte: 31.3 %
WBC mixed population: 522 cells/uL (ref 200–950)
WBC: 8.7 10*3/uL (ref 3.8–10.8)

## 2017-09-20 LAB — HEPATIC FUNCTION PANEL
AG Ratio: 1.5 (calc) (ref 1.0–2.5)
ALT: 127 U/L — ABNORMAL HIGH (ref 9–46)
AST: 72 U/L — ABNORMAL HIGH (ref 10–40)
Albumin: 4.2 g/dL (ref 3.6–5.1)
Alkaline phosphatase (APISO): 60 U/L (ref 40–115)
Bilirubin, Direct: 0.2 mg/dL (ref 0.0–0.2)
Globulin: 2.8 g/dL (calc) (ref 1.9–3.7)
Indirect Bilirubin: 0.7 mg/dL (calc) (ref 0.2–1.2)
Total Bilirubin: 0.9 mg/dL (ref 0.2–1.2)
Total Protein: 7 g/dL (ref 6.1–8.1)

## 2017-09-20 LAB — TSH: TSH: 2.38 mIU/L (ref 0.40–4.50)

## 2017-09-20 LAB — BASIC METABOLIC PANEL WITH GFR
BUN: 15 mg/dL (ref 7–25)
CO2: 24 mmol/L (ref 20–32)
Calcium: 9.7 mg/dL (ref 8.6–10.3)
Chloride: 102 mmol/L (ref 98–110)
Creat: 1.18 mg/dL (ref 0.60–1.35)
GFR, Est African American: 85 mL/min/{1.73_m2} (ref >=60)
GFR, Est Non African American: 73 mL/min/{1.73_m2} (ref >=60)
Glucose, Bld: 212 mg/dL — ABNORMAL HIGH (ref 65–99)
Potassium: 4.2 mmol/L (ref 3.5–5.3)
Sodium: 136 mmol/L (ref 135–146)

## 2017-09-20 LAB — MAGNESIUM: Magnesium: 1.7 mg/dL (ref 1.5–2.5)

## 2017-09-20 LAB — VITAMIN D 25 HYDROXY (VIT D DEFICIENCY, FRACTURES): Vit D, 25-Hydroxy: 21 ng/mL — ABNORMAL LOW (ref 30–100)

## 2017-09-20 LAB — INSULIN, RANDOM: Insulin: 24.5 u[IU]/mL — ABNORMAL HIGH (ref 2.0–19.6)

## 2017-10-17 ENCOUNTER — Other Ambulatory Visit: Payer: Self-pay | Admitting: *Deleted

## 2017-10-17 MED ORDER — AZITHROMYCIN 250 MG PO TABS: ORAL_TABLET | ORAL | 0 refills | Status: AC

## 2017-11-09 NOTE — Patient Instructions (Signed)

## 2017-11-09 NOTE — Progress Notes (Signed)
Patient ID: Gregory Jacobs, male   DOB: 07-06-1971, 47 y.o.   MRN: [id number]        This very nice 47 y.o. MWM presents for 2 &1/2  month follow up with Hypertension, HLD, Pre-DM and Vitamin D Deficiency.  Patient also is c/o pain in the upper thoracic spine area aggravated  by lifting at work.      Patient is treated for HTN (2012)  & BP has been controlled at home. Today's BP sl elevated at 124/88 and rechecked at 133/86 - P 85  and 126/88 - P 90 . Patient has had no complaints of any cardiac type chest pain, palpitations, dyspnea / orthopnea / PND, dizziness, claudication, or dependent edema.     Hyperlipidemia is controlled with diet & meds. Patient denies myalgias or other med SE's. Last Lipids were at goal: Lab Results  Component Value Date   CHOL 144 11/10/2017   HDL 34 (L) 11/10/2017   TRIG 81 11/10/2017   CHOLHDL 4.2 11/10/2017   Lab Results  Component Value Date   CHOL 144 11/10/2017   CHOL 185 09/18/2017   Lab Results  Component Value Date   HDL 34 (L) 11/10/2017   HDL 31 (L) 09/18/2017   No results found for: [facility] Lab Results  Component Value Date   TRIG 81 11/10/2017   TRIG 219 (H) 09/18/2017   Lab Results  Component Value Date   CHOLHDL 4.2 11/10/2017   CHOLHDL 6.0 (H) 09/18/2017   No results found for: LDLDIRECT      Wt Readings from Last 3 Encounters:  11/10/17 216 lb 6.4 oz (98.2 kg)  08/25/17 239 lb 12.8 oz (108.8 kg)       Also, the patient has history of T2_NIDDM (2012) and has had no symptoms of reactive hypoglycemia, diabetic polys, paresthesias or visual blurring.  Last A1c was  Lab Results  Component Value Date   HGBA1C 6.3 (H) 11/10/2017      Further, the patient also has history of Vitamin D Deficiency ("21"/Dec 2018) and supplements vitamin D 10,000 without any suspected side-effects.   Current Outpatient Medications on File Prior to Visit  Medication Sig  . lisinopril (PRINIVIL,ZESTRIL) 10 MG tablet Take 1 tablet daily for BP  .  metFORMIN (GLUCOPHAGE XR) 500 MG 24 hr tablet Take 2 tablets 2 x / day for Diabetes  . terbinafine (LAMISIL) 250 MG tablet Take 1 tablet (250 mg total) by mouth daily.  . traZODone (DESYREL) 150 MG tablet Take 1/2 to 1 tablet 1 hour before sleep   No current facility-administered medications on file prior to visit.    No Known Allergies   PMHx:   Past Medical History:  Diagnosis Date  . Diabetes mellitus without complication ([place]) 6734  . Fatty liver   . Hypertension 2012   Immunization History  Administered Date(s) Administered  . Td 05/28/2013   No past surgical history on file. FHx:    Reviewed / unchanged  SHx:    Reviewed / unchanged  Systems Review:  Constitutional: Denies fever, chills, wt changes, headaches, insomnia, fatigue, night sweats, change in appetite. Eyes: Denies redness, blurred vision, diplopia, discharge, itchy, watery eyes.  ENT: Denies discharge, congestion, post nasal drip, epistaxis, sore throat, earache, hearing loss, dental pain, tinnitus, vertigo, sinus pain, snoring.  CV: Denies chest pain, palpitations, irregular heartbeat, syncope, dyspnea, diaphoresis, orthopnea, PND, claudication or edema. Respiratory: denies cough, dyspnea, DOE, pleurisy, hoarseness, laryngitis, wheezing.  Gastrointestinal: Denies dysphagia, odynophagia, heartburn, reflux, water  brash, abdominal pain or cramps, nausea, vomiting, bloating, diarrhea, constipation, hematemesis, melena, hematochezia  or hemorrhoids. Genitourinary: Denies dysuria, frequency, urgency, nocturia, hesitancy, discharge, hematuria or flank pain. Musculoskeletal: Denies arthralgias, myalgias, stiffness, jt. swelling, pain, limping or strain/sprain.  Skin: Denies pruritus, rash, hives, warts, acne, eczema or change in skin lesion(s). Neuro: No weakness, tremor, incoordination, spasms, paresthesia or pain. Psychiatric: Denies confusion, memory loss or sensory loss. Endo: Denies change in weight, skin or hair  change.  Heme/Lymph: No excessive bleeding, bruising or enlarged lymph nodes.  Physical Exam  BP 124/88   Pulse 89   Temp 97.9 F (36.6 C)   Resp 18   Ht 6\' 2"  (1.88 m)   Wt 216 lb 6.4 oz (98.2 kg)   SpO2 97%   BMI 27.78 kg/m   Appears well nourished, well groomed  and in no distress.  Eyes: PERRLA, EOMs, conjunctiva no swelling or erythema. Sinuses: No frontal/maxillary tenderness ENT/Mouth: EAC's clear, TM's nl w/o erythema, bulging. Nares clear w/o erythema, swelling, exudates. Oropharynx clear without erythema or exudates. Oral hygiene is good. Tongue normal, non obstructing. Hearing intact.  Neck: Supple. Thyroid not palpable. Car 2+/2+ without bruits, nodes or JVD. Chest: Respirations nl with BS clear & equal w/o rales, rhonchi, wheezing or stridor.  Cor: Heart sounds normal w/ regular rate and rhythm without sig. murmurs, gallops, clicks or rubs. Peripheral pulses normal and equal  without edema.  Abdomen: Soft & bowel sounds normal. Non-tender w/o guarding, rebound, hernias, masses or organomegaly.  Lymphatics: Unremarkable.  Musculoskeletal: Full ROM all peripheral extremities, joint stability, 5/5 strength and normal gait. (+) upper para thoracis muscle tender spasm.  Skin: Warm, dry without exposed rashes, lesions or ecchymosis apparent.  Neuro: Cranial nerves intact, reflexes equal bilaterally. Sensory-motor testing grossly intact. Tendon reflexes grossly intact.  Pysch: Alert & oriented x 3.  Insight and judgement nl & appropriate. No ideations.  Assessment and Plan:  1. Essential hypertension  - Continue medication, monitor blood pressure at home.  - Continue DASH diet. Reminder to go to the ER if any CP,  SOB, nausea, dizziness, severe HA, changes vision/speech.  - BASIC METABOLIC PANEL WITH GFR - Magnesium - TSH  2. Hyperlipidemia, mixed  - Hepatic function panel - Lipid panel  3. Diabetes mellitus without complication ([place])  - Continue diet,  exercise, lifestyle modifications.  - Monitor appropriate labs.  - Hemoglobin A1c - Insulin, random  4. Vitamin D deficiency  - Continue supplementation.     - VITAMIN D 25 Hydroxy  5. Medication management  - BASIC METABOLIC PANEL WITH GFR - Hepatic function panel - Magnesium - Lipid panel - TSH - Hemoglobin A1c - Insulin, random - VITAMIN D 25 Hydroxyl  6. Thoracis Muscle strain   - Discussed proper posture & body mechanics for lifting . Rx Prednisone & Flexeril - both sparingly        Discussed  regular exercise, BP monitoring, weight control to achieve/maintain BMI less than 25 and discussed med and SE's. Recommended labs to assess and monitor clinical status with further disposition pending results of labs. Over 30 minutes of exam, counseling, chart review was performed.

## 2017-11-10 ENCOUNTER — Ambulatory Visit (INDEPENDENT_AMBULATORY_CARE_PROVIDER_SITE_OTHER): Payer: Managed Care, Other (non HMO) | Admitting: Internal Medicine

## 2017-11-10 ENCOUNTER — Encounter: Payer: Self-pay | Admitting: Internal Medicine

## 2017-11-10 VITALS — BP 124/88 | HR 89 | Temp 97.9°F | Resp 18 | Ht 74.0 in | Wt 216.4 lb

## 2017-11-10 DIAGNOSIS — I10 Essential (primary) hypertension: Secondary | ICD-10-CM | POA: Diagnosis not present

## 2017-11-10 DIAGNOSIS — E782 Mixed hyperlipidemia: Secondary | ICD-10-CM

## 2017-11-10 DIAGNOSIS — Z79899 Other long term (current) drug therapy: Secondary | ICD-10-CM | POA: Diagnosis not present

## 2017-11-10 DIAGNOSIS — E119 Type 2 diabetes mellitus without complications: Secondary | ICD-10-CM

## 2017-11-10 DIAGNOSIS — E559 Vitamin D deficiency, unspecified: Secondary | ICD-10-CM

## 2017-11-12 ENCOUNTER — Encounter: Payer: Self-pay | Admitting: Internal Medicine

## 2017-11-12 MED ORDER — ZINC 50 MG PO TABS: ORAL_TABLET | ORAL | 0 refills | Status: DC

## 2017-11-12 MED ORDER — VITAMIN D3 125 MCG (5000 UT) PO CAPS: ORAL_CAPSULE | ORAL | Status: DC

## 2017-11-12 MED ORDER — CYANOCOBALAMIN 500 MCG PO TABS: ORAL_TABLET | ORAL | Status: DC

## 2017-11-12 MED ORDER — VITAMIN C 500 MG PO TABS: ORAL_TABLET | ORAL | Status: DC

## 2017-11-13 ENCOUNTER — Other Ambulatory Visit: Payer: Self-pay | Admitting: Internal Medicine

## 2017-11-13 ENCOUNTER — Encounter: Payer: Self-pay | Admitting: *Deleted

## 2017-11-13 LAB — BASIC METABOLIC PANEL WITH GFR
BUN: 18 mg/dL (ref 7–25)
CO2: 26 mmol/L (ref 20–32)
Calcium: 9.8 mg/dL (ref 8.6–10.3)
Chloride: 105 mmol/L (ref 98–110)
Creat: 1.19 mg/dL (ref 0.60–1.35)
GFR, Est African American: 84 mL/min/{1.73_m2} (ref >=60)
GFR, Est Non African American: 72 mL/min/{1.73_m2} (ref >=60)
Glucose, Bld: 111 mg/dL — ABNORMAL HIGH (ref 65–99)
Potassium: 4.4 mmol/L (ref 3.5–5.3)
Sodium: 141 mmol/L (ref 135–146)

## 2017-11-13 LAB — MAGNESIUM: Magnesium: 2.1 mg/dL (ref 1.5–2.5)

## 2017-11-13 LAB — LIPID PANEL
Cholesterol: 144 mg/dL (ref <200)
HDL: 34 mg/dL — ABNORMAL LOW (ref >40)
LDL Cholesterol (Calc): 93 mg/dL (calc)
Non-HDL Cholesterol (Calc): 110 mg/dL (calc) (ref <130)
Total CHOL/HDL Ratio: 4.2 (calc) (ref <5.0)
Triglycerides: 81 mg/dL (ref <150)

## 2017-11-13 LAB — HEPATIC FUNCTION PANEL
AG Ratio: 1.8 (calc) (ref 1.0–2.5)
ALT: 42 U/L (ref 9–46)
AST: 27 U/L (ref 10–40)
Albumin: 4.4 g/dL (ref 3.6–5.1)
Alkaline phosphatase (APISO): 60 U/L (ref 40–115)
Bilirubin, Direct: 0.2 mg/dL (ref 0.0–0.2)
Globulin: 2.5 g/dL (calc) (ref 1.9–3.7)
Indirect Bilirubin: 0.7 mg/dL (calc) (ref 0.2–1.2)
Total Bilirubin: 0.9 mg/dL (ref 0.2–1.2)
Total Protein: 6.9 g/dL (ref 6.1–8.1)

## 2017-11-13 LAB — HEMOGLOBIN A1C
Hgb A1c MFr Bld: 6.3 % of total Hgb — ABNORMAL HIGH (ref <5.7)
Mean Plasma Glucose: 134 (calc)
eAG (mmol/L): 7.4 (calc)

## 2017-11-13 LAB — VITAMIN D 25 HYDROXY (VIT D DEFICIENCY, FRACTURES): Vit D, 25-Hydroxy: 69 ng/mL (ref 30–100)

## 2017-11-13 LAB — INSULIN, RANDOM: Insulin: 10.2 u[IU]/mL (ref 2.0–19.6)

## 2017-11-13 LAB — TSH: TSH: 1.4 mIU/L (ref 0.40–4.50)

## 2017-11-13 MED ORDER — CYCLOBENZAPRINE HCL 10 MG PO TABS: ORAL_TABLET | ORAL | 1 refills | Status: DC

## 2017-11-13 MED ORDER — PREDNISONE 20 MG PO TABS: ORAL_TABLET | ORAL | 1 refills | Status: DC

## 2017-11-20 ENCOUNTER — Other Ambulatory Visit: Payer: Self-pay | Admitting: Internal Medicine

## 2017-11-20 DIAGNOSIS — Z3009 Encounter for other general counseling and advice on contraception: Secondary | ICD-10-CM

## 2017-11-29 ENCOUNTER — Other Ambulatory Visit: Payer: Self-pay | Admitting: Adult Health

## 2017-11-29 MED ORDER — PREDNISONE 20 MG PO TABS: ORAL_TABLET | ORAL | 1 refills | Status: DC

## 2017-11-29 MED ORDER — AZITHROMYCIN 250 MG PO TABS: ORAL_TABLET | ORAL | 1 refills | Status: AC

## 2018-01-19 ENCOUNTER — Emergency Department (HOSPITAL_COMMUNITY)
Admission: EM | Admit: 2018-01-19 | Discharge: 2018-01-19 | Disposition: A | Discharge status: Home / Self Care | Payer: Managed Care, Other (non HMO) | Attending: Physician Assistant | Admitting: Physician Assistant

## 2018-01-19 ENCOUNTER — Other Ambulatory Visit: Payer: Self-pay

## 2018-01-19 ENCOUNTER — Emergency Department (HOSPITAL_COMMUNITY): Payer: Managed Care, Other (non HMO)

## 2018-01-19 ENCOUNTER — Encounter (HOSPITAL_COMMUNITY): Payer: Self-pay | Admitting: *Deleted

## 2018-01-19 DIAGNOSIS — E119 Type 2 diabetes mellitus without complications: Secondary | ICD-10-CM | POA: Diagnosis not present

## 2018-01-19 DIAGNOSIS — Z7984 Long term (current) use of oral hypoglycemic drugs: Secondary | ICD-10-CM | POA: Diagnosis not present

## 2018-01-19 DIAGNOSIS — N10 Acute tubulo-interstitial nephritis: Secondary | ICD-10-CM | POA: Diagnosis not present

## 2018-01-19 DIAGNOSIS — Z79899 Other long term (current) drug therapy: Secondary | ICD-10-CM | POA: Insufficient documentation

## 2018-01-19 DIAGNOSIS — I10 Essential (primary) hypertension: Secondary | ICD-10-CM | POA: Insufficient documentation

## 2018-01-19 DIAGNOSIS — R300 Dysuria: Secondary | ICD-10-CM | POA: Diagnosis present

## 2018-01-19 DIAGNOSIS — N12 Tubulo-interstitial nephritis, not specified as acute or chronic: Secondary | ICD-10-CM

## 2018-01-19 DIAGNOSIS — R109 Unspecified abdominal pain: Secondary | ICD-10-CM | POA: Diagnosis not present

## 2018-01-19 DIAGNOSIS — Z87891 Personal history of nicotine dependence: Secondary | ICD-10-CM | POA: Diagnosis not present

## 2018-01-19 LAB — BASIC METABOLIC PANEL
Anion gap: 11 (ref 5–15)
BUN: 17 mg/dL (ref 6–20)
CO2: 21 mmol/L — ABNORMAL LOW (ref 22–32)
Calcium: 9.2 mg/dL (ref 8.9–10.3)
Chloride: 102 mmol/L (ref 101–111)
Creatinine, Ser: 1.26 mg/dL — ABNORMAL HIGH (ref 0.61–1.24)
GFR calc Af Amer: >60 mL/min (ref >60)
GFR calc non Af Amer: >60 mL/min (ref >60)
Glucose, Bld: 174 mg/dL — ABNORMAL HIGH (ref 65–99)
Potassium: 4.2 mmol/L (ref 3.5–5.1)
Sodium: 134 mmol/L — ABNORMAL LOW (ref 135–145)

## 2018-01-19 LAB — URINALYSIS, ROUTINE W REFLEX MICROSCOPIC
Bilirubin Urine: NEGATIVE
Glucose, UA: 50 mg/dL — AB
Ketones, ur: 5 mg/dL — AB
Nitrite: POSITIVE — AB
Protein, ur: 30 mg/dL — AB
Specific Gravity, Urine: 1.03 (ref 1.005–1.030)
WBC, UA: >50 WBC/hpf — ABNORMAL HIGH (ref 0–5)
pH: 5 (ref 5.0–8.0)

## 2018-01-19 LAB — CBC
HCT: 47.5 % (ref 39.0–52.0)
Hemoglobin: 16.2 g/dL (ref 13.0–17.0)
MCH: 30.5 pg (ref 26.0–34.0)
MCHC: 34.1 g/dL (ref 30.0–36.0)
MCV: 89.3 fL (ref 78.0–100.0)
Platelets: 155 10*3/uL (ref 150–400)
RBC: 5.32 MIL/uL (ref 4.22–5.81)
RDW: 12.3 % (ref 11.5–15.5)
WBC: 25.4 10*3/uL — ABNORMAL HIGH (ref 4.0–10.5)

## 2018-01-19 LAB — I-STAT CG4 LACTIC ACID, ED: Lactic Acid, Venous: 1.3 mmol/L (ref 0.5–1.9)

## 2018-01-19 MED ORDER — SODIUM CHLORIDE 0.9 % IV BOLUS
1000.0000 mL | Freq: Once | INTRAVENOUS | Status: AC
  Administered 2018-01-19: 1000 mL via INTRAVENOUS

## 2018-01-19 MED ORDER — SODIUM CHLORIDE 0.9 % IV SOLN
2.0000 g | Freq: Once | INTRAVENOUS | Status: AC
  Administered 2018-01-19: 2 g via INTRAVENOUS
  Filled 2018-01-19: qty 20

## 2018-01-19 MED ORDER — ACETAMINOPHEN 325 MG PO TABS
650.0000 mg | ORAL_TABLET | Freq: Once | ORAL | Status: AC
Start: 2018-01-19 — End: 2018-01-19
  Administered 2018-01-19: 650 mg via ORAL
  Filled 2018-01-19: qty 2

## 2018-01-19 MED ORDER — KETOROLAC TROMETHAMINE 30 MG/ML IJ SOLN
30.0000 mg | Freq: Once | INTRAMUSCULAR | Status: AC
  Administered 2018-01-19: 30 mg via INTRAVENOUS
  Filled 2018-01-19: qty 1

## 2018-01-19 MED ORDER — CEPHALEXIN 500 MG PO CAPS: 500.0000 mg | ORAL_CAPSULE | Freq: Four times a day (QID) | ORAL | 0 refills | Status: AC

## 2018-01-19 MED ORDER — CEPHALEXIN 250 MG PO CAPS
500.0000 mg | ORAL_CAPSULE | Freq: Once | ORAL | Status: AC
  Administered 2018-01-19: 500 mg via ORAL
  Filled 2018-01-19: qty 2

## 2018-01-19 NOTE — ED Notes (Signed)
Pt verbalized understanding of all d/c instructions, prescriptions, and f/u information. Opportunity for questioning and answers provided. VSS. All belongings with patient at this time.  Pt ambulatory to lobby with steady gait.

## 2018-01-19 NOTE — ED Notes (Signed)
Patient is resting with call bell in reach  

## 2018-01-19 NOTE — Discharge Instructions (Signed)
We discussed your findings with you today.  We think that you have an infection of the urine that may have gotten into your kidneys.  It is possible that you also have prostatitis although given your exam we do not think so.  We are starting you on antibiotics for the pyelonephritis, kidney infection.  If it is not improving, or you are not able to tolerate treatment home please return immediately to the emergency department.  There was also an incidental finding on your CAT scan that we want you to follow-up with your primary care physician.  **An incidental finding of potential clinical significance has been found. Indeterminate 1.5 cm nodular density in the right upper abdominal mesentery. This structure is dense and could be partially calcified. Findings are nonspecific but could be related to prior inflammation and represent a granuloma. Consider a 6 month follow-up with IV contrast to ensure stability. **

## 2018-01-19 NOTE — ED Notes (Addendum)
Pt ambulatory to room with steady gait. Changing into gown at this time.

## 2018-01-19 NOTE — ED Provider Notes (Signed)
[facility] EMERGENCY DEPARTMENT Provider Note   CSN: [id number] Arrival date & time: 01/19/18  0818     History   Chief Complaint Chief Complaint  Patient presents with  . Dysuria  . Fever    HPI Gregory Jacobs is a 47 y.o. male.  HPI   Patient is a 47 year old male presenting with pain with urination. Patient reports that he has been having trouble fully urinating and had a lot of burning with pain.  He developed a fever.  No CVA pain.  Patient vomited once yesterday.  Patient feels achy flulike symptoms.    Patient did have prostatitis in 1 year ago.  However he states this feels different.   Past Medical History:  Diagnosis Date  . Diabetes mellitus without complication ([place]) 1638  . Fatty liver   . Hypertension 2012    Patient Active Problem List   Diagnosis Date Noted  . Essential hypertension 08/25/2017  . Hyperlipidemia, mixed 08/25/2017  . Diabetes mellitus without complication ([place]) 46/65/9935  . Vitamin D deficiency 08/25/2017  . Fatty liver   . OBESITY 10/30/2009  . TRANSAMINASES, SERUM, ELEVATED 10/30/2009    History reviewed. No pertinent surgical history.      Home Medications    Prior to Admission medications   Medication Sig Start Date End Date Taking? Authorizing Provider  Cholecalciferol (VITAMIN D3) 5000 units CAPS Takes 2 caps daily 11/12/17   Unk Pinto, MD  cyanocobalamin 500 MCG tablet Take 1 tablet daily 11/12/17   Unk Pinto, MD  cyclobenzaprine (FLEXERIL) 10 MG tablet Take 1/2 to 1 tablet 2 to 3 x / day as needed for muscle spasm 11/13/17   Unk Pinto, MD  lisinopril (PRINIVIL,ZESTRIL) 10 MG tablet Take 1 tablet daily for BP 08/25/17   Unk Pinto, MD  metFORMIN (GLUCOPHAGE XR) 500 MG 24 hr tablet Take 2 tablets 2 x / day for Diabetes 08/25/17   Unk Pinto, MD  predniSONE (DELTASONE) 20 MG tablet 1 tab 3 x day for 3 days, then 1 tab 2 x day for 3 days, then 1 tab 1 x day for 5 days 11/29/17    Liane Comber, NP  terbinafine (LAMISIL) 250 MG tablet Take 1 tablet (250 mg total) by mouth daily. 08/25/17   Unk Pinto, MD  traZODone (DESYREL) 150 MG tablet Take 1/2 to 1 tablet 1 hour before sleep 08/25/17 02/23/18  Unk Pinto, MD  vitamin C (ASCORBIC ACID) 500 MG tablet Takes 1 tablet daily 11/12/17   Unk Pinto, MD  Zinc 50 MG TABS Take 1 capsule daily 11/12/17   Unk Pinto, MD    Family History Family History  Problem Relation Age of Onset  . Cancer Mother   . Hypertension Father     Social History Social History   Tobacco Use  . Smoking status: Former Research scientist (life sciences)  . Smokeless tobacco: Current User    Types: Chew  Substance Use Topics  . Alcohol use: Not on file    Comment: rarely  . Drug use: No     Allergies   Patient has no known allergies.   Review of Systems Review of Systems  Constitutional: Positive for fatigue and fever. Negative for activity change.  Respiratory: Negative for shortness of breath.   Cardiovascular: Negative for chest pain.  Gastrointestinal: Negative for abdominal pain.  Genitourinary: Positive for decreased urine volume, difficulty urinating, dysuria and urgency.  All other systems reviewed and are negative.    Physical Exam Updated Vital Signs BP  125/78   Pulse (!) 104   Temp (!) 100.5 F (38.1 C) (Oral)   Resp (!) 24   Ht 6\' 2"  (1.88 m)   Wt 97.1 kg (214 lb)   SpO2 95%   BMI 27.48 kg/m   Physical Exam  Constitutional: He is oriented to person, place, and time. He appears well-nourished.  HENT:  Head: Normocephalic.  Eyes: Conjunctivae are normal.  Cardiovascular: Normal rate and regular rhythm.  Pulmonary/Chest: Effort normal and breath sounds normal. No respiratory distress.  Abdominal: Soft. He exhibits no distension. There is no tenderness.  Genitourinary: Prostate normal.  Genitourinary Comments: Normal scotral and prostate exam. Done with chaperone  Neurological: He is oriented to person, place,  and time.  Skin: Skin is warm and dry. He is not diaphoretic.  Psychiatric: He has a normal mood and affect. His behavior is normal.     ED Treatments / Results  Labs (all labs ordered are listed, but only abnormal results are displayed) Labs Reviewed  URINALYSIS, ROUTINE W REFLEX MICROSCOPIC - Abnormal; Notable for the following components:      Result Value   APPearance HAZY (*)    Glucose, UA 50 (*)    Hgb urine dipstick SMALL (*)    Ketones, ur 5 (*)    Protein, ur 30 (*)    Nitrite POSITIVE (*)    Leukocytes, UA LARGE (*)    WBC, UA >50 (*)    Bacteria, UA MANY (*)    All other components within normal limits  CBC - Abnormal; Notable for the following components:   WBC 25.4 (*)    All other components within normal limits  BASIC METABOLIC PANEL - Abnormal; Notable for the following components:   Sodium 134 (*)    CO2 21 (*)    Glucose, Bld 174 (*)    Creatinine, Ser 1.26 (*)    All other components within normal limits  URINE CULTURE  I-STAT CG4 LACTIC ACID, ED    EKG None  Radiology No results found.  Procedures Procedures (including critical care time)  Medications Ordered in ED Medications  sodium chloride 0.9 % bolus 1,000 mL (has no administration in time range)  acetaminophen (TYLENOL) tablet 650 mg (has no administration in time range)  cefTRIAXone (ROCEPHIN) 2 g in sodium chloride 0.9 % 100 mL IVPB (has no administration in time range)     Initial Impression / Assessment and Plan / ED Course  I have reviewed the triage vital signs and the nursing notes.  Pertinent labs & imaging results that were available during my care of the patient were reviewed by me and considered in my medical decision making (see chart for details).     Patient is a 47 year old male presenting with pain with urination. Patient reports that he has been having trouble fully urinating and had a lot of burning with pain.  He developed a fever.  No CVA pain.  Patient vomited  once yesterday.  Patient feels achy flulike symptoms.    Patient did have prostatitis in 1 year ago.  However he states this feels different.   12:48 PM Patient has nitrate positive urine.  This is likely urinary tract infection/pyelonephritis.  However given the elevated blood count and fever, would like to rule out stone.  Considered prostatitis as well.  However he has normal prostate exam.   3:27 PM Patient's CAT scan does not show any evidence of stone.  Patient is tolerating p.o.  Patient's heart rate normalized  after fluids.  Patient reveals much better.  Lactic negative. Had extensive discussion with patient about the findings on CT, and anticipate that he will start feeling better with abx.  Strict return precautions expressed.  Additionally had discussion with diagnostic uncertainty of prostatitis versus pyelonephritis. Final Clinical Impressions(s) / ED Diagnoses   Final diagnoses:  None    ED Discharge Orders    None       Telma Pyeatt, Fredia Sorrow, MD 01/19/18 1529

## 2018-01-19 NOTE — ED Notes (Signed)
ED Provider at bedside. 

## 2018-01-19 NOTE — ED Notes (Addendum)
Per patient's wife, pt took 650 mg of tylenol at 5 AM for fever of 103 F. Pt reports symptoms started yesterday and have been gradually getting worse. Pt reports lower abd discomfort and the feeling of not being able to full empty his bladder.

## 2018-01-19 NOTE — ED Triage Notes (Signed)
Pt reports having fever since yesterday. Having bodyaches and difficulty urinating, has fullness and urge to urinate but unable to empty bladder. Has burning pain when urinating.

## 2018-01-19 NOTE — ED Notes (Signed)
Pt returned from CT °

## 2018-01-21 ENCOUNTER — Telehealth: Payer: Self-pay | Admitting: Internal Medicine

## 2018-01-21 ENCOUNTER — Encounter: Payer: Self-pay | Admitting: Internal Medicine

## 2018-01-21 ENCOUNTER — Other Ambulatory Visit: Payer: Self-pay | Admitting: Internal Medicine

## 2018-01-21 DIAGNOSIS — D72829 Elevated white blood cell count, unspecified: Secondary | ICD-10-CM

## 2018-01-21 DIAGNOSIS — N410 Acute prostatitis: Secondary | ICD-10-CM

## 2018-01-21 LAB — URINE CULTURE: Culture: >=100000 — AB

## 2018-01-21 MED ORDER — CIPROFLOXACIN HCL 500 MG PO TABS: 500.0000 mg | ORAL_TABLET | Freq: Two times a day (BID) | ORAL | 0 refills | Status: DC

## 2018-01-21 NOTE — Progress Notes (Signed)
   Patient in ER 3 days ago with fever, leukocytosis 25+ K and Renal CT scan. Given Rocephin & Rx Keflex.   Still fever & drenching sweats  U/C (+) e.coli Sen to Rocephin, but resistant to Cephalexin - And very sensitive to Cipro  Rx - Cipro 500 sent in    Check CBC and PSA in am

## 2018-01-21 NOTE — Telephone Encounter (Signed)
Patient in ER 3 days ago with fever, leukocytosis 25+ K and Renal CT scan. Given Rocephin & Rx Keflex.   Still fever & drenching sweats  U/C (+) e.coli Sen to Rocephin, but resistant to Cephalexin - And very sensitive to Cipro  Rx - Cipro 500 sent in    Check CBC and PSA in am

## 2018-01-22 ENCOUNTER — Other Ambulatory Visit: Payer: Managed Care, Other (non HMO)

## 2018-01-22 ENCOUNTER — Telehealth: Payer: Self-pay | Admitting: *Deleted

## 2018-01-22 DIAGNOSIS — N410 Acute prostatitis: Secondary | ICD-10-CM

## 2018-01-22 DIAGNOSIS — I10 Essential (primary) hypertension: Secondary | ICD-10-CM | POA: Diagnosis not present

## 2018-01-22 DIAGNOSIS — D72829 Elevated white blood cell count, unspecified: Secondary | ICD-10-CM

## 2018-01-22 NOTE — Telephone Encounter (Signed)
Post ED Visit - Positive Culture Follow-up  Culture report reviewed by antimicrobial stewardship pharmacist:  []  Elenor Quinones, Pharm.D. []  Heide Guile, Pharm.D., BCPS AQ-ID []  Parks Neptune, Pharm.D., BCPS []  Alycia Rossetti, Pharm.D., BCPS []  [place], Pharm.D., BCPS, AAHIVP []  Legrand Como, Pharm.D., BCPS, AAHIVP []  Salome Arnt, PharmD, BCPS []  Wynell Balloon, PharmD []  Vincenza Hews, PharmD, BCPS Jimmy Footman, PharmD  Positive urine culture Treated with Cephalexin, organism sensitive to the same and no further patient follow-up is required at this time.  Harlon Flor [facility] 01/22/2018, 10:38 AM

## 2018-01-23 LAB — CBC WITH DIFFERENTIAL/PLATELET
Basophils Absolute: 48 cells/uL (ref 0–200)
Basophils Relative: 0.6 %
Eosinophils Absolute: 40 cells/uL (ref 15–500)
Eosinophils Relative: 0.5 %
HCT: 43.3 % (ref 38.5–50.0)
Hemoglobin: 14.9 g/dL (ref 13.2–17.1)
Lymphs Abs: 1200 cells/uL (ref 850–3900)
MCH: 29.4 pg (ref 27.0–33.0)
MCHC: 34.4 g/dL (ref 32.0–36.0)
MCV: 85.6 fL (ref 80.0–100.0)
MPV: 12.5 fL (ref 7.5–12.5)
Monocytes Relative: 9.9 %
Neutro Abs: 5920 cells/uL (ref 1500–7800)
Neutrophils Relative %: 74 %
Platelets: 168 10*3/uL (ref 140–400)
RBC: 5.06 10*6/uL (ref 4.20–5.80)
RDW: 12.4 % (ref 11.0–15.0)
Total Lymphocyte: 15 %
WBC mixed population: 792 cells/uL (ref 200–950)
WBC: 8 10*3/uL (ref 3.8–10.8)

## 2018-01-23 LAB — PSA: PSA: 20.5 ng/mL — ABNORMAL HIGH (ref <=4.0)

## 2018-01-26 ENCOUNTER — Other Ambulatory Visit: Payer: Self-pay | Admitting: Adult Health

## 2018-01-26 MED ORDER — CIPROFLOXACIN HCL 500 MG PO TABS: 500.0000 mg | ORAL_TABLET | Freq: Two times a day (BID) | ORAL | 0 refills | Status: DC

## 2018-02-01 ENCOUNTER — Other Ambulatory Visit: Payer: Self-pay | Admitting: Adult Health

## 2018-02-01 MED ORDER — CIPROFLOXACIN HCL 500 MG PO TABS: 500.0000 mg | ORAL_TABLET | Freq: Two times a day (BID) | ORAL | 0 refills | Status: DC

## 2018-02-08 DIAGNOSIS — Z6826 Body mass index (BMI) 26.0-26.9, adult: Secondary | ICD-10-CM | POA: Insufficient documentation

## 2018-02-08 DIAGNOSIS — N419 Inflammatory disease of prostate, unspecified: Secondary | ICD-10-CM

## 2018-02-08 DIAGNOSIS — E663 Overweight: Secondary | ICD-10-CM | POA: Insufficient documentation

## 2018-02-08 HISTORY — DX: Inflammatory disease of prostate, unspecified: N41.9

## 2018-02-08 NOTE — Progress Notes (Signed)
FOLLOW UP  Assessment and Plan:   Hypertension Well controlled with current medications  Monitor blood pressure at home; patient to call if consistently greater than 130/80 Continue DASH diet.   Reminder to go to the ER if any CP, SOB, nausea, dizziness, severe HA, changes vision/speech, left arm numbness and tingling and jaw pain.  Cholesterol Currently at goal; hold off on statin despite diabetes as he is aggressively working on lifestyle with goal to reverse  Continue low cholesterol diet and exercise.  Check lipid panel.   Diabetes without complications Continue medication: metformin - will start tapering down once A1C in normal range Continue diet and exercise.  Perform daily foot/skin check, notify office of any concerning changes.  Check A1C  BMI 26 Long discussion about weight loss, diet, and exercise Recommended diet heavy in fruits and veggies and low in animal meats, cheeses, and dairy products, appropriate calorie intake Discussed ideal weight for height  Continue current efforts with lifestyle modification Will follow up in 3 months  Vitamin D Def At goal at last visit; continue supplementation to maintain goal of 70-100 Defer Vit D level  Prostatitis Recheck PSA; continue cipro for 6 week course treatment; will recheck PSA/urine 2 weeks after Will start finasteride due to recurrent prostatitis and enlarged prostate on recent imaging Refer to urology for any further ongoing issues.   Multiple nevi/ chronic recurrent rash of back Referral to dermatology for skin check Recurrent rash, discuss firm diagnosis; thus far clears with oral/topical antifungal but recurrent with cessation of treatment.   Continue diet and meds as discussed. Further disposition pending results of labs. Discussed med's effects and SE's.   Over 30 minutes of exam, counseling, chart review, and critical decision making was performed.   Future Appointments  Date Time Provider [place]  05/25/2018 11:00 AM Unk Pinto, MD [id number] None    ----------------------------------------------------------------------------------------------------------------------  HPI 47 y.o. male  presents for 3 month follow up on hypertension, cholesterol, diabetes, weight and vitamin D deficiency. He recently presented to the ED on 5/3 with drenching sweats and leukocytosis (25+), was diagnosed with pyelonephritis and discharged on keflex; he continued to experience fever and drenching sweats, keflex discontinued and cipro initiated; CBC rechecked (leukocytosis resolved to 8) along with PSA (20.5) on 5/5; cipro course was extended to 30 days for prostatitis. On review, CT from 5/3 demonstrated prominent prostate measuring 5.9 cm in the transverse dimension. Will recheck PSA today.   BMI is Body mass index is 26.83 kg/m., he has been working on diet and exercise. Wt Readings from Last 3 Encounters:  02/09/18 209 lb (94.8 kg)  01/19/18 214 lb (97.1 kg)  11/10/17 216 lb 6.4 oz (98.2 kg)   His blood pressure has been controlled at home, today their BP is BP: 120/70  He does not workout but work physically active job. He denies chest pain, shortness of breath, dizziness.   He is not on cholesterol medication and denies myalgias. His cholesterol is at goal. The cholesterol last visit was:   Lab Results  Component Value Date   CHOL 144 11/10/2017   HDL 34 (L) 11/10/2017   LDLCALC 93 11/10/2017   TRIG 81 11/10/2017   CHOLHDL 4.2 11/10/2017    He has been working on diet and exercise for T2 diabetes on metformin, and denies foot ulcerations, increased appetite, nausea, paresthesia of the feet, polydipsia, polyuria, visual disturbances, vomiting and weight loss. He does not currently check glucose at home very consistently.  Last A1C  in the office was:  Lab Results  Component Value Date   HGBA1C 6.3 (H) 11/10/2017   Patient is on Vitamin D supplement and at goal at recent check:     Lab Results  Component Value Date   VD25OH 69 11/10/2017        Current Medications:  Current Outpatient Medications on File Prior to Visit  Medication Sig  . Cinnamon 500 MG capsule Take 1,000 mg by mouth 2 (two) times daily.  . Cholecalciferol (VITAMIN D3) 5000 units CAPS Takes 2 caps daily  . ciprofloxacin (CIPRO) 500 MG tablet Take 1 tablet (500 mg total) by mouth 2 (two) times daily for 11 days.  . cyanocobalamin 500 MCG tablet Take 1 tablet daily  . lisinopril (PRINIVIL,ZESTRIL) 10 MG tablet Take 1 tablet daily for BP  . metFORMIN (GLUCOPHAGE XR) 500 MG 24 hr tablet Take 2 tablets 2 x / day for Diabetes  . terbinafine (LAMISIL) 250 MG tablet Take 1 tablet (250 mg total) by mouth daily.  . traZODone (DESYREL) 150 MG tablet Take 1/2 to 1 tablet 1 hour before sleep  . vitamin C (ASCORBIC ACID) 500 MG tablet Takes 1 tablet daily  . Zinc 50 MG TABS Take 1 capsule daily   No current facility-administered medications on file prior to visit.      Allergies: No Known Allergies   Medical History:  Past Medical History:  Diagnosis Date  . Diabetes mellitus without complication ([place]) 8938  . Fatty liver   . Hypertension 2012   Family history- Reviewed and unchanged Social history- Reviewed and unchanged   Review of Systems:  Review of Systems  Constitutional: Negative for malaise/fatigue and weight loss.  HENT: Negative for hearing loss and tinnitus.   Eyes: Negative for blurred vision and double vision.  Respiratory: Negative for cough, shortness of breath and wheezing.   Cardiovascular: Negative for chest pain, palpitations, orthopnea, claudication and leg swelling.  Gastrointestinal: Negative for abdominal pain, blood in stool, constipation, diarrhea, heartburn, melena, nausea and vomiting.  Genitourinary: Negative.   Musculoskeletal: Negative for joint pain and myalgias.  Skin: Positive for rash (Rash to back, chronic/recurrent).  Neurological: Negative for dizziness,  tingling, sensory change, weakness and headaches.  Endo/Heme/Allergies: Negative for polydipsia.  Psychiatric/Behavioral: Negative.   All other systems reviewed and are negative.   Physical Exam: BP 120/70   Pulse 95   Temp (!) 97.5 F (36.4 C)   Resp 16   Ht 6\' 2"  (1.88 m)   Wt 209 lb (94.8 kg)   SpO2 96%   BMI 26.83 kg/m  Wt Readings from Last 3 Encounters:  02/09/18 209 lb (94.8 kg)  01/19/18 214 lb (97.1 kg)  11/10/17 216 lb 6.4 oz (98.2 kg)   General Appearance: Well nourished, in no apparent distress. Eyes: PERRLA, EOMs, conjunctiva no swelling or erythema Sinuses: No Frontal/maxillary tenderness ENT/Mouth: Ext aud canals clear, TMs without erythema, bulging. No erythema, swelling, or exudate on post pharynx.  Tonsils not swollen or erythematous. Hearing normal.  Neck: Supple, thyroid normal.  Respiratory: Respiratory effort normal, BS equal bilaterally without rales, rhonchi, wheezing or stridor.  Cardio: RRR with no MRGs. Brisk peripheral pulses without edema.  Abdomen: Soft, + BS.  Non tender, no guarding, rebound, hernias, masses. Lymphatics: Non tender without lymphadenopathy.  Musculoskeletal: Full ROM, 5/5 strength, Normal gait Skin: Warm, dry, no lesions, ecchymosis. He does have rash to back; multiple round erythematous faintly raised lesions with some scaling, non-pruritic Neuro: Cranial nerves intact. No cerebellar  symptoms.  Psych: Awake and oriented X 3, normal affect, Insight and Judgment appropriate.    Izora Ribas, NP 11:44 AM [facility]

## 2018-02-09 ENCOUNTER — Ambulatory Visit (INDEPENDENT_AMBULATORY_CARE_PROVIDER_SITE_OTHER): Payer: Managed Care, Other (non HMO) | Admitting: Adult Health

## 2018-02-09 ENCOUNTER — Encounter: Payer: Self-pay | Admitting: Adult Health

## 2018-02-09 VITALS — BP 120/70 | HR 95 | Temp 97.5°F | Resp 16 | Ht 74.0 in | Wt 209.0 lb

## 2018-02-09 DIAGNOSIS — R21 Rash and other nonspecific skin eruption: Secondary | ICD-10-CM

## 2018-02-09 DIAGNOSIS — N410 Acute prostatitis: Secondary | ICD-10-CM | POA: Diagnosis not present

## 2018-02-09 DIAGNOSIS — E782 Mixed hyperlipidemia: Secondary | ICD-10-CM | POA: Diagnosis not present

## 2018-02-09 DIAGNOSIS — E559 Vitamin D deficiency, unspecified: Secondary | ICD-10-CM

## 2018-02-09 DIAGNOSIS — I10 Essential (primary) hypertension: Secondary | ICD-10-CM

## 2018-02-09 DIAGNOSIS — K760 Fatty (change of) liver, not elsewhere classified: Secondary | ICD-10-CM

## 2018-02-09 DIAGNOSIS — D229 Melanocytic nevi, unspecified: Secondary | ICD-10-CM | POA: Diagnosis not present

## 2018-02-09 DIAGNOSIS — E119 Type 2 diabetes mellitus without complications: Secondary | ICD-10-CM | POA: Diagnosis not present

## 2018-02-09 DIAGNOSIS — Z6827 Body mass index (BMI) 27.0-27.9, adult: Secondary | ICD-10-CM

## 2018-02-09 MED ORDER — CIPROFLOXACIN HCL 500 MG PO TABS: 500.0000 mg | ORAL_TABLET | Freq: Two times a day (BID) | ORAL | 0 refills | Status: AC

## 2018-02-09 NOTE — Patient Instructions (Signed)
We will start finasteride once your PSA comes back down to "normal" range or baseline.   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 Once you are done with cipro, we will have you come back 2 weeks later for lab only check for urine and PSA.    Finasteride (Proscar) tablets What is this medicine? FINASTERIDE (fi NAS teer ide) is used to treat benign prostatic hyperplasia (BPH) in men. This is a condition that causes you to have an enlarged prostate. This medicine helps to control your symptoms, decrease urinary retention, and reduces your risk of needing surgery. When used in combination with certain other medicines, this drug can slow down the progression of your disease. This medicine may be used for other purposes; ask your health care provider or pharmacist if you have questions. COMMON BRAND NAME(S): Proscar What should I tell my health care provider before I take this medicine? They need to know if you have any of these conditions: -liver disease -an unusual or allergic reaction to finasteride, other medicines, foods, dyes, or preservatives -pregnant or trying to get pregnant -breast-feeding How should I use this medicine? Take this medicine by mouth with a glass of water. Follow the directions on the prescription label. You can take this medicine with or without food. Take your doses at regular intervals. Do not take your medicine more often than directed. Do not stop taking except on the advice of your doctor or health care professional. Talk to your pediatrician regarding the use of this medicine in children. Special care may be needed. Overdosage: If you think you have taken too much of this medicine contact a poison control center or emergency room at once. NOTE: This medicine is only for you. Do not share this medicine with  others. What if I miss a dose? If you miss a dose, take it as soon as you can. If it is almost time for your next dose, take only that dose. Do not take double or extra doses. What may interact with this medicine? -saw palmetto or other dietary supplements This list may not describe all possible interactions. Give your health care provider a list of all the medicines, herbs, non-prescription drugs, or dietary supplements you use. Also tell them if you smoke, drink alcohol, or use illegal drugs. Some items may interact with your medicine. What should I watch for while using this medicine? Do not donate blood while you are taking this medicine. This will prevent giving this medicine to a pregnant male through a blood transfusion. Ask your doctor or health care professional when it is safe to donate blood after you stop taking this medicine. Women who are pregnant or may get pregnant must not handle broken or crushed finasteride tablets. The active ingredient could harm the unborn baby. If a pregnant woman comes into contact with broken or crushed tablets she should check with her doctor or health care professional. Exposure to whole tablets is not expected to cause harm as long as they are not swallowed. Contact your doctor or health care professional if your symptoms do not start to get better. You may need to take this medicine for 6 to 12 months to get the best results. This medicine can interfere with PSA laboratory tests for prostate cancer. If you are scheduled to have a lab test  for prostate cancer, tell your doctor or health care professional that you are taking this medicine. This medicine may increase your risk of getting some cancers, like breast cancer. Talk with your doctor. What side effects may I notice from receiving this medicine? Side effects that you should report to your doctor or health care professional as soon as possible: -any signs of an allergic reaction like rash, itching,  hives or swelling of the lips or face -changes in breast like lumps, pain or fluids leaking from the nipple -pain in the testicles Side effects that usually do not require medical attention (report to your doctor or health care professional if they continue or are bothersome): -sexual difficulties like decreased sexual desire or ability to get an erection -small amount of semen released during sex This list may not describe all possible side effects. Call your doctor for medical advice about side effects. You may report side effects to FDA at 1-800-FDA-1088. Where should I keep my medicine? Keep out of the reach of children. Store at room temperature below 30 degrees C (86 degrees F). Protect from light. Keep container tightly closed. Throw away any unused medicine after the expiration date. NOTE: This sheet is a summary. It may not cover all possible information. If you have questions about this medicine, talk to your doctor, pharmacist, or health care provider.  2018 Elsevier/Gold Standard (2015-04-23 17:24:30)

## 2018-02-10 LAB — CBC WITH DIFFERENTIAL/PLATELET
Basophils Absolute: 80 cells/uL (ref 0–200)
Basophils Relative: 0.8 %
Eosinophils Absolute: 100 cells/uL (ref 15–500)
Eosinophils Relative: 1 %
HCT: 46.2 % (ref 38.5–50.0)
Hemoglobin: 15.9 g/dL (ref 13.2–17.1)
Lymphs Abs: 3100 cells/uL (ref 850–3900)
MCH: 29.3 pg (ref 27.0–33.0)
MCHC: 34.4 g/dL (ref 32.0–36.0)
MCV: 85.2 fL (ref 80.0–100.0)
MPV: 10.8 fL (ref 7.5–12.5)
Monocytes Relative: 6.4 %
Neutro Abs: 6080 cells/uL (ref 1500–7800)
Neutrophils Relative %: 60.8 %
Platelets: 277 10*3/uL (ref 140–400)
RBC: 5.42 10*6/uL (ref 4.20–5.80)
RDW: 12.2 % (ref 11.0–15.0)
Total Lymphocyte: 31 %
WBC mixed population: 640 cells/uL (ref 200–950)
WBC: 10 10*3/uL (ref 3.8–10.8)

## 2018-02-10 LAB — COMPLETE METABOLIC PANEL WITH GFR
AG Ratio: 1.5 (calc) (ref 1.0–2.5)
ALT: 30 U/L (ref 9–46)
AST: 19 U/L (ref 10–40)
Albumin: 4.4 g/dL (ref 3.6–5.1)
Alkaline phosphatase (APISO): 63 U/L (ref 40–115)
BUN: 15 mg/dL (ref 7–25)
CO2: 22 mmol/L (ref 20–32)
Calcium: 10 mg/dL (ref 8.6–10.3)
Chloride: 105 mmol/L (ref 98–110)
Creat: 1.08 mg/dL (ref 0.60–1.35)
GFR, Est African American: 94 mL/min/{1.73_m2} (ref >=60)
GFR, Est Non African American: 81 mL/min/{1.73_m2} (ref >=60)
Globulin: 2.9 g/dL (calc) (ref 1.9–3.7)
Glucose, Bld: 164 mg/dL — ABNORMAL HIGH (ref 65–99)
Potassium: 4.3 mmol/L (ref 3.5–5.3)
Sodium: 138 mmol/L (ref 135–146)
Total Bilirubin: 0.8 mg/dL (ref 0.2–1.2)
Total Protein: 7.3 g/dL (ref 6.1–8.1)

## 2018-02-10 LAB — TSH: TSH: 2.28 mIU/L (ref 0.40–4.50)

## 2018-02-10 LAB — LIPID PANEL
Cholesterol: 158 mg/dL (ref <200)
HDL: 40 mg/dL — ABNORMAL LOW (ref >40)
LDL Cholesterol (Calc): 101 mg/dL (calc) — ABNORMAL HIGH
Non-HDL Cholesterol (Calc): 118 mg/dL (calc) (ref <130)
Total CHOL/HDL Ratio: 4 (calc) (ref <5.0)
Triglycerides: 84 mg/dL (ref <150)

## 2018-02-10 LAB — PSA: PSA: 2.4 ng/mL (ref <=4.0)

## 2018-02-10 LAB — HEMOGLOBIN A1C
Hgb A1c MFr Bld: 6.4 % of total Hgb — ABNORMAL HIGH (ref <5.7)
Mean Plasma Glucose: 137 (calc)
eAG (mmol/L): 7.6 (calc)

## 2018-02-11 ENCOUNTER — Other Ambulatory Visit: Payer: Self-pay | Admitting: Adult Health

## 2018-02-11 DIAGNOSIS — N410 Acute prostatitis: Secondary | ICD-10-CM

## 2018-02-11 MED ORDER — FINASTERIDE 5 MG PO TABS: 5.0000 mg | ORAL_TABLET | Freq: Every day | ORAL | 1 refills | Status: DC

## 2018-03-27 ENCOUNTER — Other Ambulatory Visit: Payer: Self-pay | Admitting: *Deleted

## 2018-03-27 DIAGNOSIS — E119 Type 2 diabetes mellitus without complications: Secondary | ICD-10-CM

## 2018-03-27 MED ORDER — METFORMIN HCL ER 500 MG PO TB24: ORAL_TABLET | ORAL | 1 refills | Status: DC

## 2018-04-08 ENCOUNTER — Other Ambulatory Visit: Payer: Self-pay | Admitting: Internal Medicine

## 2018-04-08 DIAGNOSIS — I10 Essential (primary) hypertension: Secondary | ICD-10-CM

## 2018-05-25 ENCOUNTER — Encounter: Payer: Self-pay | Admitting: Internal Medicine

## 2018-06-05 ENCOUNTER — Encounter: Payer: Self-pay | Admitting: Adult Health

## 2018-06-22 DIAGNOSIS — E1122 Type 2 diabetes mellitus with diabetic chronic kidney disease: Secondary | ICD-10-CM | POA: Insufficient documentation

## 2018-06-22 DIAGNOSIS — N402 Nodular prostate without lower urinary tract symptoms: Secondary | ICD-10-CM | POA: Insufficient documentation

## 2018-06-22 DIAGNOSIS — N182 Chronic kidney disease, stage 2 (mild): Secondary | ICD-10-CM

## 2018-06-22 NOTE — Progress Notes (Addendum)
Complete Physical  Assessment and Plan:  Diagnoses and all orders for this visit:  Encounter for routine adult health examination without abnormal findings  Essential hypertension Continue medication Monitor blood pressure at home; call if consistently over 130/80 Continue DASH diet.   Reminder to go to the ER if any CP, SOB, nausea, dizziness, severe HA, changes vision/speech, left arm numbness and tingling and jaw pain.  Fatty liver Weight loss advised, avoid alcohol/tylenol, will monitor LFTs  Type 2 diabetes mellitus with stage 2 chronic kidney disease, without long-term current use of insulin [facility]) Education: Reviewed 'ABCs' of diabetes management (respective goals in parentheses):  A1C (<7), blood pressure (<130/80), and cholesterol (LDL <70) Eye Exam yearly and Dental Exam every 6 months. Dietary recommendations Physical Activity recommendations  Prostate nodule Followed by Dr. Gloriann Loan at [facility] Pending biopsy results - just had 2 days ago, will defer on PSA  Vitamin D deficiency Continue supplementation Check vitamin D level  Overweight Long discussion about weight loss, diet, and exercise Recommended diet heavy in fruits and veggies and low in animal meats, cheeses, and dairy products, appropriate calorie intake Discussed appropriate weight for height  Follow up at next visit  Stage 2 chronic kidney disease due to type 2 diabetes mellitus (HCC) Increase fluids, avoid NSAIDS, monitor sugars, will monitor  Hyperlipidemia associated with type 2 diabetes mellitus ([place]) Mildly elevated; patient declines medications at this time Continue low cholesterol diet and exercise.  Check lipid panel.   Abnormal EKG IRBBB, ? Short PR waves, inconclusive delta No palpitations/tachycardia, reduce caffeine Declines cardiology at this time, repeat EKG in 1 year or sooner as indicated ECHO when becomes available in office   Orders Placed This Encounter  Procedures  .  Pneumococcal polysaccharide vaccine 23-valent greater than or equal to 2yo subcutaneous/IM  . CBC with Differential/Platelet  . COMPLETE METABOLIC PANEL WITH GFR  . Magnesium  . Lipid panel  . TSH  . Hemoglobin A1c  . VITAMIN D 25 Hydroxy (Vit-D Deficiency, Fractures)  . Urinalysis w microscopic + reflex cultur  . Microalbumin / creatinine urine ratio  . EKG 12-Lead    Discussed med's effects and SE's. Screening labs and tests as requested with regular follow-up as recommended. Over 40 minutes of exam, counseling, chart review and critical decision making was performed  Future Appointments  Date Time Provider [place]  06/26/2019 10:00 AM Liane Comber, NP [facility] None     HPI Patient presents for a complete physical. He has Essential hypertension; Hyperlipidemia associated with type 2 diabetes mellitus ([place]); Type 2 diabetes mellitus with kidney complication ([place]); Vitamin D deficiency; Fatty liver; BMI 26.0-26.9,adult; Prostate nodule; and Stage 2 chronic kidney disease due to type 2 diabetes mellitus ([place]) on their problem list.  He is married, 3 kids, son just went to [employer], works at [employer].   He had prostatitis earlier this year, and reported hx of failed vasectomy and was evaluated by Dr. Gloriann Loan at [facility], who noted prostate nodule on exam. He had biopsy of this 2 days ago and is pending pathology results, will discuss this Friday.   He follows with Dr. Ubaldo Glassing, had 2 moles removed this AM. Was seen for recurrent rash to back, diagnosed with   BMI is Body mass index is 29.01 kg/m., he has been working on diet and exercise. Walks extensively at work daily. Currently on new shift at work which has messed up his eating schedule.  Wt Readings from Last 3 Encounters:  06/25/18 229 lb (  103.9 kg)  02/09/18 209 lb (94.8 kg)  01/19/18 214 lb (97.1 kg)   His blood pressure has been controlled at home, today their BP is BP: 110/62 He does not workout. He denies  chest pain, shortness of breath, dizziness.   He is not on cholesterol medication and denies myalgias. His cholesterol is not at goal. The cholesterol last visit was:   Lab Results  Component Value Date   CHOL 158 02/09/2018   HDL 40 (L) 02/09/2018   LDLCALC 101 (H) 02/09/2018   TRIG 84 02/09/2018   CHOLHDL 4.0 02/09/2018   He has been working on diet and exercise for T2DM on metformin, he is not on bASA, he is on ACE/ARB and denies foot ulcerations, hyperglycemia, hypoglycemia , increased appetite, nausea, paresthesia of the feet, polydipsia, polyuria and visual disturbances. Last A1C in the office was:  Lab Results  Component Value Date   HGBA1C 6.4 (H) 02/09/2018   Last GFR: Lab Results  Component Value Date   GFRNONAA 81 02/09/2018    Patient is on Vitamin D supplement.   Lab Results  Component Value Date   VD25OH 69 11/10/2017     Last PSA was: Lab Results  Component Value Date   PSA 2.4 02/09/2018    Current Medications:  Current Outpatient Medications on File Prior to Visit  Medication Sig Dispense Refill  . APPLE CIDER VINEGAR PO Take 1 Dose by mouth daily.    . Cholecalciferol (VITAMIN D3) 5000 units CAPS Takes 2 caps daily 30 capsule   . Cinnamon 500 MG capsule Take 1,000 mg by mouth 2 (two) times daily.    . cyanocobalamin 500 MCG tablet Take 1 tablet daily    . fluconazole (DIFLUCAN) 200 MG tablet Take 200 mg by mouth daily.    Marland Kitchen lisinopril (PRINIVIL,ZESTRIL) 10 MG tablet TAKE 1 TABLET DAILY FOR BLOOD PRESSURE 90 tablet 3  . metFORMIN (GLUCOPHAGE XR) 500 MG 24 hr tablet Take 2 tablets 2 x / day for Diabetes 360 tablet 1  . Multiple Vitamins-Minerals (MULTIVITAMIN ADULT PO) Take by mouth.    . vitamin C (ASCORBIC ACID) 500 MG tablet Takes 1 tablet daily    . traZODone (DESYREL) 150 MG tablet Take 1/2 to 1 tablet 1 hour before sleep 90 tablet 1   No current facility-administered medications on file prior to visit.    Allergies:  No Known Allergies Health  Maintenance:  Immunization History  Administered Date(s) Administered  . Td 05/28/2013    Tetanus: 2014 Flu vaccine: declines Pneumonia: DUE - done today  Colonoscopy: -  EGD: -  Eye Exam: Last exam 2017, needs to schedule Dentist: Last exam, 2017, needs to schedule   Patient Care Team: Unk Pinto, MD as PCP - General (Internal Medicine)  Medical History:  has Essential hypertension; Hyperlipidemia associated with type 2 diabetes mellitus ([place]); Type 2 diabetes mellitus with kidney complication ([place]); Vitamin D deficiency; Fatty liver; BMI 26.0-26.9,adult; Prostate nodule; and Stage 2 chronic kidney disease due to type 2 diabetes mellitus ([place]) on their problem list. Surgical History:  He  has a past surgical history that includes Cholecystectomy, laparoscopic (2011) and Vasectomy (2016). Family History:  His family history includes Breast cancer in his mother; Breast cancer (age of onset: 52) in his maternal aunt; Diabetes in his paternal uncle; Heart attack (age of onset: 71) in his father; Hypertension in his father; Suicidality (age of onset: 36) in his sister. Social History:   reports that he quit smoking about  4 years ago. His smoking use included cigarettes. He started smoking about 23 years ago. He smoked 0.50 packs per day. His smokeless tobacco use includes chew. He reports that he does not use drugs. His alcohol history is not on file. Review of Systems:  Review of Systems  Constitutional: Negative for malaise/fatigue and weight loss.  HENT: Negative for hearing loss and tinnitus.   Eyes: Negative for blurred vision and double vision.  Respiratory: Negative for cough, sputum production, shortness of breath and wheezing.   Cardiovascular: Negative for chest pain, palpitations, orthopnea, claudication, leg swelling and PND.  Gastrointestinal: Negative for abdominal pain, blood in stool, constipation, diarrhea, heartburn, melena, nausea and vomiting.  Genitourinary:  Negative.   Musculoskeletal: Negative for falls, joint pain and myalgias.  Skin: Negative for rash.  Neurological: Negative for dizziness, tingling, sensory change, weakness and headaches.  Endo/Heme/Allergies: Negative for polydipsia.  Psychiatric/Behavioral: Negative.  Negative for depression, memory loss, substance abuse and suicidal ideas. The patient is not nervous/anxious and does not have insomnia.   All other systems reviewed and are negative.   Physical Exam: Estimated body mass index is 29.01 kg/m as calculated from the following:   Height as of this encounter: 6' 2.5" (1.892 m).   Weight as of this encounter: 229 lb (103.9 kg). BP 110/62   Pulse 70   Temp (!) 97.3 F (36.3 C)   Ht 6' 2.5" (1.892 m)   Wt 229 lb (103.9 kg)   SpO2 96%   BMI 29.01 kg/m  General Appearance: Well nourished, in no apparent distress.  Eyes: PERRLA, EOMs, conjunctiva no swelling or erythema, normal fundi and vessels.  Sinuses: No Frontal/maxillary tenderness  ENT/Mouth: Ext aud canals clear, normal light reflex with TMs without erythema, bulging. Good dentition. No erythema, swelling, or exudate on post pharynx. Tonsils not swollen or erythematous. Hearing normal.  Neck: Supple, thyroid normal. No bruits  Respiratory: Respiratory effort normal, BS equal bilaterally without rales, rhonchi, wheezing or stridor.  Cardio: RRR without murmurs, rubs or gallops. Brisk peripheral pulses without edema.  Chest: symmetric, with normal excursions and percussion.  Abdomen: Soft, nontender, no guarding, rebound, hernias, masses, or organomegaly.  Lymphatics: Non tender without lymphadenopathy.  Genitourinary: Defer to urology Musculoskeletal: Full ROM all peripheral extremities,5/5 strength, and normal gait.  Skin: defer, just saw derm Neuro: Cranial nerves intact, reflexes equal bilaterally. Normal muscle tone, no cerebellar symptoms. Sensation intact.  Psych: Awake and oriented X 3, normal affect, Insight  and Judgment appropriate.   EKG: IRBBB, no ST changes, ? Short P  Gregory Jacobs 10:38 AM [facility]

## 2018-06-25 ENCOUNTER — Encounter: Payer: Self-pay | Admitting: Adult Health

## 2018-06-25 ENCOUNTER — Ambulatory Visit (INDEPENDENT_AMBULATORY_CARE_PROVIDER_SITE_OTHER): Payer: Managed Care, Other (non HMO) | Admitting: Adult Health

## 2018-06-25 VITALS — BP 110/62 | HR 70 | Temp 97.3°F | Ht 74.5 in | Wt 229.0 lb

## 2018-06-25 DIAGNOSIS — Z1329 Encounter for screening for other suspected endocrine disorder: Secondary | ICD-10-CM | POA: Diagnosis not present

## 2018-06-25 DIAGNOSIS — I10 Essential (primary) hypertension: Secondary | ICD-10-CM

## 2018-06-25 DIAGNOSIS — L42 Pityriasis rosea: Secondary | ICD-10-CM | POA: Insufficient documentation

## 2018-06-25 DIAGNOSIS — Z13220 Encounter for screening for lipoid disorders: Secondary | ICD-10-CM

## 2018-06-25 DIAGNOSIS — Z131 Encounter for screening for diabetes mellitus: Secondary | ICD-10-CM

## 2018-06-25 DIAGNOSIS — Z23 Encounter for immunization: Secondary | ICD-10-CM

## 2018-06-25 DIAGNOSIS — Z79899 Other long term (current) drug therapy: Secondary | ICD-10-CM

## 2018-06-25 DIAGNOSIS — K760 Fatty (change of) liver, not elsewhere classified: Secondary | ICD-10-CM

## 2018-06-25 DIAGNOSIS — Z136 Encounter for screening for cardiovascular disorders: Secondary | ICD-10-CM

## 2018-06-25 DIAGNOSIS — E1122 Type 2 diabetes mellitus with diabetic chronic kidney disease: Secondary | ICD-10-CM

## 2018-06-25 DIAGNOSIS — E1169 Type 2 diabetes mellitus with other specified complication: Secondary | ICD-10-CM

## 2018-06-25 DIAGNOSIS — N402 Nodular prostate without lower urinary tract symptoms: Secondary | ICD-10-CM

## 2018-06-25 DIAGNOSIS — Z0000 Encounter for general adult medical examination without abnormal findings: Secondary | ICD-10-CM | POA: Diagnosis not present

## 2018-06-25 DIAGNOSIS — Z1389 Encounter for screening for other disorder: Secondary | ICD-10-CM

## 2018-06-25 DIAGNOSIS — Z6826 Body mass index (BMI) 26.0-26.9, adult: Secondary | ICD-10-CM

## 2018-06-25 DIAGNOSIS — E559 Vitamin D deficiency, unspecified: Secondary | ICD-10-CM | POA: Diagnosis not present

## 2018-06-25 DIAGNOSIS — E785 Hyperlipidemia, unspecified: Secondary | ICD-10-CM

## 2018-06-25 DIAGNOSIS — R9431 Abnormal electrocardiogram [ECG] [EKG]: Secondary | ICD-10-CM

## 2018-06-25 DIAGNOSIS — N182 Chronic kidney disease, stage 2 (mild): Secondary | ICD-10-CM

## 2018-06-25 NOTE — Patient Instructions (Addendum)
  Mr. Gregory Jacobs , Thank you for taking time to come for your Annual Wellness Visit. I appreciate your ongoing commitment to your health goals. Please review the following plan we discussed and let me know if I can assist you in the future.   These are the goals we discussed: Goals    . HEMOGLOBIN A1C < 5.7    . LDL CALC < 70    . Weight (lb) < 200 lb (90.7 kg)       This is a list of the screening recommended for you and due dates:  Health Maintenance  Topic Date Due  . Pneumococcal vaccine  03/07/1972  . Eye exam for diabetics  03/07/1980  . Flu Shot  06/26/2019*  . HIV Screening  06/26/2019*  . Hemoglobin A1C  08/12/2018  . Complete foot exam   06/26/2019  . Tetanus Vaccine  05/29/2023  *Topic was postponed. The date shown is not the original due date.    Please schedule an ophthalmology and dental visit     Know what a healthy weight is for you (roughly BMI <25) and aim to maintain this  Aim for 7+ servings of fruits and vegetables daily  65-80+ fluid ounces of water or unsweet tea for healthy kidneys  Limit to max 1 drink of alcohol per day; avoid smoking/tobacco  Limit animal fats in diet for cholesterol and heart health - choose grass fed whenever available  Avoid highly processed foods, and foods high in saturated/trans fats  Aim for low stress - take time to unwind and care for your mental health  Aim for 150 min of moderate intensity exercise weekly for heart health, and weights twice weekly for bone health  Aim for 7-9 hours of sleep daily      When it comes to diets, agreement about the perfect plan isn't easy to find, even among the experts. Experts at the [employer] developed an idea known as the Healthy Eating Plate. Just imagine a plate divided into logical, healthy portions.  The emphasis is on diet quality:  Load up on vegetables and fruits - one-half of your plate: Aim for color and variety, and remember that potatoes don't  count.  Go for whole grains - one-quarter of your plate: Whole wheat, barley, wheat berries, quinoa, oats, brown rice, and foods made with them. If you want pasta, go with whole wheat pasta.  Protein power - one-quarter of your plate: Fish, chicken, beans, and nuts are all healthy, versatile protein sources. Limit red meat.  The diet, however, does go beyond the plate, offering a few other suggestions.  Use healthy plant oils, such as olive, canola, soy, corn, sunflower and peanut. Check the labels, and avoid partially hydrogenated oil, which have unhealthy trans fats.  If you're thirsty, drink water. Coffee and tea are good in moderation, but skip sugary drinks and limit milk and dairy products to one or two daily servings.  The type of carbohydrate in the diet is more important than the amount. Some sources of carbohydrates, such as vegetables, fruits, whole grains, and beans-are healthier than others.  Finally, stay active.

## 2018-06-26 LAB — CBC WITH DIFFERENTIAL/PLATELET
Basophils Absolute: 81 cells/uL (ref 0–200)
Basophils Relative: 0.8 %
Eosinophils Absolute: 303 cells/uL (ref 15–500)
Eosinophils Relative: 3 %
HCT: 46.4 % (ref 38.5–50.0)
Hemoglobin: 15.9 g/dL (ref 13.2–17.1)
Lymphs Abs: 2677 cells/uL (ref 850–3900)
MCH: 29.7 pg (ref 27.0–33.0)
MCHC: 34.3 g/dL (ref 32.0–36.0)
MCV: 86.6 fL (ref 80.0–100.0)
MPV: 11.8 fL (ref 7.5–12.5)
Monocytes Relative: 6.8 %
Neutro Abs: 6353 cells/uL (ref 1500–7800)
Neutrophils Relative %: 62.9 %
Platelets: 207 10*3/uL (ref 140–400)
RBC: 5.36 10*6/uL (ref 4.20–5.80)
RDW: 12.6 % (ref 11.0–15.0)
Total Lymphocyte: 26.5 %
WBC mixed population: 687 cells/uL (ref 200–950)
WBC: 10.1 10*3/uL (ref 3.8–10.8)

## 2018-06-26 LAB — LIPID PANEL
Cholesterol: 171 mg/dL (ref <200)
HDL: 34 mg/dL — ABNORMAL LOW (ref >40)
LDL Cholesterol (Calc): 97 mg/dL (calc)
Non-HDL Cholesterol (Calc): 137 mg/dL (calc) — ABNORMAL HIGH (ref <130)
Total CHOL/HDL Ratio: 5 (calc) — ABNORMAL HIGH (ref <5.0)
Triglycerides: 282 mg/dL — ABNORMAL HIGH (ref <150)

## 2018-06-26 LAB — URINALYSIS W MICROSCOPIC + REFLEX CULTURE
Bacteria, UA: NONE SEEN /HPF
Bilirubin Urine: NEGATIVE
Hyaline Cast: NONE SEEN /LPF
Ketones, ur: NEGATIVE
Leukocyte Esterase: NEGATIVE
Nitrites, Initial: NEGATIVE
Protein, ur: NEGATIVE
Specific Gravity, Urine: 1.019 (ref 1.001–1.03)
Squamous Epithelial / LPF: NONE SEEN /HPF (ref <=5)
WBC, UA: NONE SEEN /HPF (ref 0–5)
pH: <=5 (ref 5.0–8.0)

## 2018-06-26 LAB — COMPLETE METABOLIC PANEL WITH GFR
AG Ratio: 1.6 (calc) (ref 1.0–2.5)
ALT: 65 U/L — ABNORMAL HIGH (ref 9–46)
AST: 30 U/L (ref 10–40)
Albumin: 4.2 g/dL (ref 3.6–5.1)
Alkaline phosphatase (APISO): 53 U/L (ref 40–115)
BUN: 13 mg/dL (ref 7–25)
CO2: 25 mmol/L (ref 20–32)
Calcium: 9.9 mg/dL (ref 8.6–10.3)
Chloride: 107 mmol/L (ref 98–110)
Creat: 0.91 mg/dL (ref 0.60–1.35)
GFR, Est African American: 115 mL/min/{1.73_m2} (ref >=60)
GFR, Est Non African American: 99 mL/min/{1.73_m2} (ref >=60)
Globulin: 2.6 g/dL (calc) (ref 1.9–3.7)
Glucose, Bld: 176 mg/dL — ABNORMAL HIGH (ref 65–99)
Potassium: 4.1 mmol/L (ref 3.5–5.3)
Sodium: 139 mmol/L (ref 135–146)
Total Bilirubin: 0.5 mg/dL (ref 0.2–1.2)
Total Protein: 6.8 g/dL (ref 6.1–8.1)

## 2018-06-26 LAB — MICROALBUMIN / CREATININE URINE RATIO
Creatinine, Urine: 99 mg/dL (ref 20–320)
Microalb Creat Ratio: 4 mcg/mg creat (ref <30)
Microalb, Ur: 0.4 mg/dL

## 2018-06-26 LAB — HEMOGLOBIN A1C
Hgb A1c MFr Bld: 6.8 % of total Hgb — ABNORMAL HIGH (ref <5.7)
Mean Plasma Glucose: 148 (calc)
eAG (mmol/L): 8.2 (calc)

## 2018-06-26 LAB — MAGNESIUM: Magnesium: 1.8 mg/dL (ref 1.5–2.5)

## 2018-06-26 LAB — TSH: TSH: 2.19 mIU/L (ref 0.40–4.50)

## 2018-06-26 LAB — VITAMIN D 25 HYDROXY (VIT D DEFICIENCY, FRACTURES): Vit D, 25-Hydroxy: 45 ng/mL (ref 30–100)

## 2018-06-26 LAB — NO CULTURE INDICATED

## 2018-07-11 ENCOUNTER — Ambulatory Visit (INDEPENDENT_AMBULATORY_CARE_PROVIDER_SITE_OTHER): Payer: Managed Care, Other (non HMO)

## 2018-07-11 DIAGNOSIS — Z23 Encounter for immunization: Secondary | ICD-10-CM | POA: Diagnosis not present

## 2018-09-24 ENCOUNTER — Encounter: Payer: Self-pay | Admitting: Internal Medicine

## 2018-09-28 ENCOUNTER — Ambulatory Visit: Payer: Self-pay | Admitting: Physician Assistant

## 2018-10-12 ENCOUNTER — Ambulatory Visit: Payer: Self-pay | Admitting: Adult Health Nurse Practitioner

## 2018-10-17 ENCOUNTER — Other Ambulatory Visit: Payer: Self-pay | Admitting: Adult Health

## 2018-10-17 MED ORDER — PREDNISONE 20 MG PO TABS: ORAL_TABLET | ORAL | 0 refills | Status: DC

## 2018-10-17 MED ORDER — OSELTAMIVIR PHOSPHATE 75 MG PO CAPS: 75.0000 mg | ORAL_CAPSULE | Freq: Every day | ORAL | 0 refills | Status: AC

## 2018-10-17 NOTE — Progress Notes (Signed)
Patient's wife presented to report + influenza in daughter; requesting tamiflu for her husband. He has new symptoms of cough, achiness since yesterday. Will send in tamiflu and prednisone per her request. SE with tamiflu discussed.

## 2018-10-18 NOTE — Progress Notes (Signed)
FOLLOW UP  Assessment and Plan:  Essential hypertension - continue medications, DASH diet, exercise and monitor at home. Call if greater than 130/80.  Continue ACE -     CBC with Differential/Platelet -     COMPLETE METABOLIC PANEL WITH GFR -     TSH  Hyperlipidemia associated with type 2 diabetes mellitus (HCC) check lipids decrease fatty foods increase activity.  - discussed NOT at goal at this time, does not want medications, will try to do better diet -     Lipid panel  Type 2 diabetes mellitus with stage 2 chronic kidney disease, without long-term current use of insulin (HCC) Discussed general issues about diabetes pathophysiology and management., Educational material distributed., Suggested low cholesterol diet., Encouraged aerobic exercise., Discussed foot care., Reminded to get yearly retinal exam. Rule out LADA DM x 5-6 years that he knows of Goal to get off metformin -     Hemoglobin A1c -     Glutamic acid decarboxylase auto abs  Stage 2 chronic kidney disease due to type 2 diabetes mellitus ([place]) Discussed general issues about diabetes pathophysiology and management., Educational material distributed., Suggested low cholesterol diet., Encouraged aerobic exercise., Discussed foot care., Reminded to get yearly retinal exam.  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  Cough Likely post viral, lungs CTAB, given breo, if not better will take zpak  Continue diet and meds as discussed. Further disposition pending results of labs. Discussed med's effects and SE's.   Over 30 minutes of exam, counseling, chart review, and critical decision making was performed.   Future  Appointments  Date Time Provider [place]  06/26/2019 10:00 AM Liane Comber, NP [facility] None    ----------------------------------------------------------------------------------------------------------------------  HPI 48 y.o. male  presents for 3 month follow up on hypertension, cholesterol, diabetes, weight and vitamin D deficiency.   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 He has not been sleeping as much. Patient has his CDL and states if he does not get enough sleep he will drink a lot of sugary drinks to stay awake.   BMI is Body mass index is 29.11 kg/m., he is working on diet and exercise. Wt Readings from Last 3 Encounters:  10/22/18 229 lb 12.8 oz (104.2 kg)  06/25/18 229 lb (103.9 kg)  02/09/18 209 lb (94.8 kg)   Patient has had recurrent prostatitis, following with urology, had negative prostate biopsy.   He had recent influenza 1 week ago, daughter tested +, now having symptoms. He is having wheezing, cough, some SOB but not gasping for air and no CP.  No recent fever or chills.   His blood pressure has been controlled at home, today their BP is BP: 122/76  He does not workout but work physically active job. He denies chest pain, shortness of breath, dizziness.   He is not on cholesterol medication and denies myalgias. His cholesterol is at goal. The cholesterol last visit was:   Lab Results  Component Value Date   CHOL 171 06/25/2018   HDL 34 (L) 06/25/2018   [place]  97 06/25/2018   TRIG 282 (H) 06/25/2018   CHOLHDL 5.0 (H) 06/25/2018    He has been working on diet and exercise for T2 diabetes on metformin, and denies foot ulcerations, increased appetite, nausea, paresthesia of the feet, polydipsia, polyuria, visual disturbances, vomiting and weight loss. He does not currently check glucose at home very consistently.  Last A1C in the office was:  Lab Results  Component Value Date   HGBA1C 6.8 (H)  06/25/2018     Lab Results  Component Value Date   GFRNONAA 99 06/25/2018   Patient is on Vitamin D supplement and at goal at recent check:    Lab Results  Component Value Date   VD25OH 45 06/25/2018        Current Medications:  Current Outpatient Medications on File Prior to Visit  Medication Sig  . APPLE CIDER VINEGAR PO Take 1 Dose by mouth daily.  . Cholecalciferol (VITAMIN D3) 5000 units CAPS Takes 2 caps daily  . Cinnamon 500 MG capsule Take 1,000 mg by mouth 2 (two) times daily.  . cyanocobalamin 500 MCG tablet Take 1 tablet daily  . lisinopril (PRINIVIL,ZESTRIL) 10 MG tablet TAKE 1 TABLET DAILY FOR BLOOD PRESSURE  . metFORMIN (GLUCOPHAGE XR) 500 MG 24 hr tablet Take 2 tablets 2 x / day for Diabetes  . Multiple Vitamins-Minerals (MULTIVITAMIN ADULT PO) Take by mouth.  . oseltamivir (TAMIFLU) 75 MG capsule Take 1 capsule (75 mg total) by mouth daily for 10 days.  . vitamin C (ASCORBIC ACID) 500 MG tablet Takes 1 tablet daily   No current facility-administered medications on file prior to visit.      Allergies: No Known Allergies   Medical History:  Past Medical History:  Diagnosis Date  . Diabetes mellitus without complication ([place]) 0109  . Fatty liver   . Hypertension 2012   Family history- Reviewed and unchanged Social history- Reviewed and unchanged   Review of Systems:  Review of Systems  Constitutional: Negative for malaise/fatigue and weight loss.  HENT: Negative for hearing loss and tinnitus.   Eyes: Negative for blurred vision and double vision.  Respiratory: Negative for cough, shortness of breath and wheezing.   Cardiovascular: Negative for chest pain, palpitations, orthopnea, claudication and leg swelling.  Gastrointestinal: Negative for abdominal pain, blood in stool, constipation, diarrhea, heartburn, melena, nausea and vomiting.  Genitourinary: Negative.   Musculoskeletal: Negative for joint pain and myalgias.  Skin: Positive for rash (Rash  to back, chronic/recurrent).  Neurological: Negative for dizziness, tingling, sensory change, weakness and headaches.  Endo/Heme/Allergies: Negative for polydipsia.  Psychiatric/Behavioral: Negative.   All other systems reviewed and are negative.   Physical Exam: BP 122/76   Pulse 67   Temp 98.1 F (36.7 C)   Ht 6' 2.5" (1.892 m)   Wt 229 lb 12.8 oz (104.2 kg)   SpO2 97%   BMI 29.11 kg/m  Wt Readings from Last 3 Encounters:  10/22/18 229 lb 12.8 oz (104.2 kg)  06/25/18 229 lb (103.9 kg)  02/09/18 209 lb (94.8 kg)   General Appearance: Well nourished, in no apparent distress. Eyes: PERRLA, EOMs, conjunctiva no swelling or erythema Sinuses: No Frontal/maxillary tenderness ENT/Mouth: Ext aud canals clear, TMs without erythema, bulging. No erythema, swelling, or exudate on post pharynx.  Tonsils not swollen or erythematous. Hearing normal.  Neck: Supple, thyroid normal.  Respiratory: Respiratory effort normal, BS equal bilaterally without rales, rhonchi, wheezing or stridor.  Cardio: RRR with no MRGs. Brisk peripheral pulses without edema.  Abdomen:  Soft, + BS.  Non tender, no guarding, rebound, hernias, masses. Lymphatics: Non tender without lymphadenopathy.  Musculoskeletal: Full ROM, 5/5 strength, Normal gait Skin: Warm, dry, no lesions, ecchymosis. He does have rash to back; multiple round erythematous faintly raised lesions with some scaling, non-pruritic Neuro: Cranial nerves intact. No cerebellar symptoms.  Psych: Awake and oriented X 3, normal affect, Insight and Judgment appropriate.    Vicie Mutters, PA-C 11:55 AM [facility]

## 2018-10-22 ENCOUNTER — Encounter: Payer: Self-pay | Admitting: Physician Assistant

## 2018-10-22 ENCOUNTER — Ambulatory Visit (INDEPENDENT_AMBULATORY_CARE_PROVIDER_SITE_OTHER): Payer: Managed Care, Other (non HMO) | Admitting: Physician Assistant

## 2018-10-22 VITALS — BP 122/76 | HR 67 | Temp 98.1°F | Ht 74.5 in | Wt 229.8 lb

## 2018-10-22 DIAGNOSIS — E1122 Type 2 diabetes mellitus with diabetic chronic kidney disease: Secondary | ICD-10-CM

## 2018-10-22 DIAGNOSIS — I10 Essential (primary) hypertension: Secondary | ICD-10-CM

## 2018-10-22 DIAGNOSIS — N182 Chronic kidney disease, stage 2 (mild): Secondary | ICD-10-CM

## 2018-10-22 DIAGNOSIS — E1169 Type 2 diabetes mellitus with other specified complication: Secondary | ICD-10-CM | POA: Diagnosis not present

## 2018-10-22 DIAGNOSIS — K760 Fatty (change of) liver, not elsewhere classified: Secondary | ICD-10-CM

## 2018-10-22 DIAGNOSIS — E559 Vitamin D deficiency, unspecified: Secondary | ICD-10-CM

## 2018-10-22 DIAGNOSIS — G4726 Circadian rhythm sleep disorder, shift work type: Secondary | ICD-10-CM

## 2018-10-22 DIAGNOSIS — E785 Hyperlipidemia, unspecified: Secondary | ICD-10-CM | POA: Diagnosis not present

## 2018-10-22 DIAGNOSIS — M791 Myalgia, unspecified site: Secondary | ICD-10-CM | POA: Diagnosis not present

## 2018-10-22 MED ORDER — AZITHROMYCIN 250 MG PO TABS: ORAL_TABLET | ORAL | 1 refills | Status: AC

## 2018-10-22 MED ORDER — ARMODAFINIL 150 MG PO TABS: 150.0000 mg | ORAL_TABLET | Freq: Every day | ORAL | 0 refills | Status: DC

## 2018-10-22 MED ORDER — FLUTICASONE FUROATE-VILANTEROL 100-25 MCG/INH IN AEPB: 1.0000 | INHALATION_SPRAY | Freq: Every day | RESPIRATORY_TRACT | 0 refills | Status: DC

## 2018-10-22 NOTE — Patient Instructions (Addendum)
Diet soda leads to weight gain.  We recently discovered that the artifical sugar in the soda stops an enzyme in your stomach that is suppose to signal that your brain is full. So patients that drink a lot of diet soda will never feel full and tend to over eat. So please cut back on diet soda and it can help with your weight loss.   A study found that two or more sodas a day increased the risk of hypertension, diabetes, stroke.   Another recent study found that higher consumption of soda and juice was associated with increased risk of cancer and breast cancer.   In animal studies artifical sweeteners affect the gut bacteria and may contribute to chronic disease this way.   INFORMATION ABOUT YOUR STEROID INHALER  Can do steroid inhaler, NEED TO DO DAILY, this is NOT a rescue inhaler so if you are acutely short of breath please use your albuterol or call 911.  Do 1 puff ONCE a day.  Do before you brush your teeth OR wash your mouth afterwards.  IF YOU DO NOT [place] YOUR MOUTH OUT IT CAN CAUSE YEAST Can do 2 tsp vinegar with water and switch to help prevent yeast or help yeast in your mouth.   Go to the ER if any chest pain, shortness of breath, nausea, dizziness, severe HA, changes vision/speech  If not better take antibiotic but just hold on to it  Diabetes is a very complicated disease...lets simplify it.  An easy way to look at it to understand the complications is if you think of the extra sugar floating in your blood stream as glass shards floating through your blood stream.    Diabetes affects your small vessels first: 1) The glass shards (sugar) scraps down the tiny blood vessels in your eyes and lead to diabetic retinopathy, the leading cause of blindness in the [place]. Diabetes is the leading cause of newly diagnosed adult (24 to 49 years of age) blindness in the [place].  2) The glass shards scratches down the tiny vessels of your legs leading to nerve damage called neuropathy and  can lead to amputations of your feet. More than 60% of all non-traumatic amputations of lower limbs occur in people with diabetes.  3) Over time the small vessels in your brain are shredded and closed off, individually this does not cause any problems but over a long period of time many of the small vessels being blocked can lead to Vascular Dementia.   4) Your kidney's are a filter system and have a "net" that keeps certain things in the body and lets bad things out. Sugar shreds this net and leads to kidney damage and eventually failure. Decreasing the sugar that is destroying the net and certain blood pressure medications can help stop or decrease progression of kidney disease. Diabetes was the primary cause of kidney failure in 44 percent of all new cases in 2011.  5) Diabetes also destroys the small vessels in your penis that lead to erectile dysfunction. Eventually the vessels are so damaged that you may not be responsive to cialis or viagra.   Diabetes and your large vessels: Your larger vessels consist of your coronary arteries in your heart and the carotid vessels to your brain. Diabetes or even increased sugars put you at 300% increased risk of heart attack and stroke and this is why.. The sugar scrapes down your large blood vessels and your body sees this as an internal injury and tries  to repair itself. Just like you get a scab on your skin, your platelets will stick to the blood vessel wall trying to heal it. This is why we have diabetics on low dose aspirin daily, this prevents the platelets from sticking and can prevent plaque formation. In addition, your body takes cholesterol and tries to shove it into the open wound. This is why we want your LDL, or bad cholesterol, below 70.   The combination of platelets and cholesterol over 5-10 years forms plaque that can break off and cause a heart attack or stroke.   PLEASE REMEMBER:  Diabetes is preventable! Up to 76 percent of complications  and morbidities among individuals with type 2 diabetes can be prevented, delayed, or effectively treated and minimized with regular visits to a health professional, appropriate monitoring and medication, and a healthy diet and lifestyle.   Fatty liver or Nonalcoholic fatty liver disease (NASH)  Now the leading cause of liver failure in the [place].  It is normally from such risk factors as obesity, diabetes, insulin resistance, high cholesterol, or metabolic syndrome.  The only definitive therapy is weight loss and exercise.    Suggest walking 20-30 mins daily.  Decreasing carbohydrates, increasing veggies.  Vitamin E 800 IU a day may be beneficial. Liver cancer has been noted in patient with fatty liver without cirrhosis.  Will monitor closely   Fatty Liver Fatty liver is the accumulation of fat in liver cells. It is also called hepatosteatosis or steatohepatitis. It is normal for your liver to contain some fat. If fat is more than 5 to 10% of your liver's weight, you have fatty liver.  There are often no symptoms (problems) for years while damage is still occurring. People often learn about their fatty liver when they have medical tests for other reasons. Fat can damage your liver for years or even decades without causing problems. When it becomes severe, it can cause fatigue, weight loss, weakness, and confusion. This makes you more likely to develop more serious liver problems. The liver is the largest organ in the body. It does a lot of work and often gives no warning signs when it is sick until late in a disease. The liver has many important jobs including:  Breaking down foods.  Storing vitamins, iron, and other minerals.  Making proteins.  Making bile for food digestion.  Breaking down many products including medications, alcohol and some poisons.  PROGNOSIS  Fatty liver may cause no damage or it can lead to an inflammation of the liver. This is, called steatohepatitis.   Over time the liver may become scarred and hardened. This condition is called cirrhosis. Cirrhosis is serious and may lead to liver failure or cancer. NASH is one of the leading causes of cirrhosis. About 10-20% of Americans have fatty liver and a smaller 2-5% has NASH.  TREATMENT   Weight loss, fat restriction, and exercise in overweight patients produces inconsistent results but is worth trying.  Good control of diabetes may reduce fatty liver.  Eat a balanced, healthy diet.  Increase your physical activity.  There are no medical or surgical treatments for a fatty liver or NASH, but improving your diet and increasing your exercise may help prevent or reverse some of the damage.    Diabetes or even increased sugars put you at 300% increased risk of heart attack and stroke.  ALSO BEING DIABETIC YOU MAY NOT HAVE ANY PAIN WITH A HEART ATTACK.  Even worse of a chance of  no pain if you are a woman.  It is very unlikely that you will have any pain with a heart attack. Likely your symptoms will be very subtle, even for very severe disease.  Your symptoms for a heart attack will likely occur when you exert your self or exercise and include: Shortness of breath Sweating Nausea Dizziness Fast or irregular heart beats Fatigue   It makes me feel better if my diabetics get their heart rate up with exercise once or twice a week and pay close attention to your body. If there is ANY change in your exercise capacity or if you have symptoms above, please STOP and call 911 or call to come to the office.   PLEASE REMEMBER:  Diabetes is preventable! Up to 53 percent of complications and morbidities among individuals with type 2 diabetes can be prevented, delayed, or effectively treated and minimized with regular visits to a health professional, appropriate monitoring and medication, and a healthy diet and lifestyle.   Here is some information to help you keep your heart healthy: Move it! - Aim for 30  mins of activity every day. Take it slowly at first. Talk to [place] before starting any new exercise program.   Lose it.  -Body Mass Index (BMI) can indicate if you need to lose weight. A healthy range is 18.5-24.9. For a BMI calculator, go to [employer]  Waist Management -Excess abdominal fat is a risk factor for heart disease, diabetes, asthma, stroke and more. Ideal waist circumference is less than 35" for women and less than 40" for men.   Eat Right -focus on fruits, vegetables, whole grains, and meals you make yourself. Avoid foods with trans fat and high sugar/sodium content.   Snooze or Snore? - Loud snoring can be a sign of sleep apnea, a significant risk factor for high blood pressure, heart attach, stroke, and heart arrhythmias.  Kick the habit -Quit Smoking! Avoid second hand smoke. A single cigarette raises your blood pressure for 20 mins and increases the risk of heart attack and stroke for the next 24 hours.   Are Aspirin and Supplements right for you? -Add ENTERIC COATED low dose 81 mg Aspirin daily OR can do every other day if you have easy bruising to protect your heart and head. As well as to reduce risk of Colon Cancer by 20 %, Skin Cancer by 26 % , Melanoma by 46% and Pancreatic cancer by 60%  Say "No to Stress -There may be little you can do about problems that cause stress. However, techniques such as long walks, meditation, and exercise can help you manage it.   Start Now! - Make changes one at a time and set reasonable goals to increase your likelihood of success.       Bad carbs also include fruit juice, alcohol, and sweet tea. These are empty calories that do not signal to your brain that you are full.   Please remember the good carbs are still carbs which convert into sugar. So please measure them out no more than 1/2-1 cup of rice, oatmeal, pasta, and beans  Veggies are however free foods! Pile them on.   Not all fruit is created equal. Please see the  list below, the fruit at the bottom is higher in sugars than the fruit at the top. Please avoid all dried fruits.

## 2018-10-25 LAB — TEST AUTHORIZATION

## 2018-10-25 LAB — COMPLETE METABOLIC PANEL WITH GFR
AG Ratio: 1.5 (calc) (ref 1.0–2.5)
ALT: 155 U/L — ABNORMAL HIGH (ref 9–46)
AST: 67 U/L — ABNORMAL HIGH (ref 10–40)
Albumin: 4.3 g/dL (ref 3.6–5.1)
Alkaline phosphatase (APISO): 56 U/L (ref 36–130)
BUN: 17 mg/dL (ref 7–25)
CO2: 26 mmol/L (ref 20–32)
Calcium: 10.1 mg/dL (ref 8.6–10.3)
Chloride: 100 mmol/L (ref 98–110)
Creat: 0.94 mg/dL (ref 0.60–1.35)
GFR, Est African American: 111 mL/min/{1.73_m2} (ref >=60)
GFR, Est Non African American: 95 mL/min/{1.73_m2} (ref >=60)
Globulin: 2.9 g/dL (calc) (ref 1.9–3.7)
Glucose, Bld: 213 mg/dL — ABNORMAL HIGH (ref 65–99)
Potassium: 4.1 mmol/L (ref 3.5–5.3)
Sodium: 138 mmol/L (ref 135–146)
Total Bilirubin: 1 mg/dL (ref 0.2–1.2)
Total Protein: 7.2 g/dL (ref 6.1–8.1)

## 2018-10-25 LAB — IRON, TOTAL/TOTAL IRON BINDING CAP
%SAT: 50 % (calc) — ABNORMAL HIGH (ref 20–48)
Iron: 147 ug/dL (ref 50–180)
TIBC: 296 mcg/dL (calc) (ref 250–425)

## 2018-10-25 LAB — CBC WITH DIFFERENTIAL/PLATELET
Absolute Monocytes: 634 cells/uL (ref 200–950)
Basophils Absolute: 31 cells/uL (ref 0–200)
Basophils Relative: 0.3 %
Eosinophils Absolute: 21 cells/uL (ref 15–500)
Eosinophils Relative: 0.2 %
HCT: 49.3 % (ref 38.5–50.0)
Hemoglobin: 16.7 g/dL (ref 13.2–17.1)
Lymphs Abs: 4306 cells/uL — ABNORMAL HIGH (ref 850–3900)
MCH: 29.5 pg (ref 27.0–33.0)
MCHC: 33.9 g/dL (ref 32.0–36.0)
MCV: 87.1 fL (ref 80.0–100.0)
MPV: 11.8 fL (ref 7.5–12.5)
Monocytes Relative: 6.1 %
Neutro Abs: 5408 cells/uL (ref 1500–7800)
Neutrophils Relative %: 52 %
Platelets: 254 10*3/uL (ref 140–400)
RBC: 5.66 10*6/uL (ref 4.20–5.80)
RDW: 12.4 % (ref 11.0–15.0)
Total Lymphocyte: 41.4 %
WBC: 10.4 10*3/uL (ref 3.8–10.8)

## 2018-10-25 LAB — HEMOGLOBIN A1C
Hgb A1c MFr Bld: 8.4 % of total Hgb — ABNORMAL HIGH (ref <5.7)
Mean Plasma Glucose: 194 (calc)
eAG (mmol/L): 10.8 (calc)

## 2018-10-25 LAB — LIPID PANEL
Cholesterol: 181 mg/dL (ref <200)
HDL: 31 mg/dL — ABNORMAL LOW (ref >=40)
LDL Cholesterol (Calc): 109 mg/dL (calc) — ABNORMAL HIGH
Non-HDL Cholesterol (Calc): 150 mg/dL (calc) — ABNORMAL HIGH (ref <130)
Total CHOL/HDL Ratio: 5.8 (calc) — ABNORMAL HIGH (ref <5.0)
Triglycerides: 310 mg/dL — ABNORMAL HIGH (ref <150)

## 2018-10-25 LAB — VITAMIN B12: Vitamin B-12: >2000 pg/mL — ABNORMAL HIGH (ref 200–1100)

## 2018-10-25 LAB — FERRITIN: Ferritin: 736 ng/mL — ABNORMAL HIGH (ref 38–380)

## 2018-10-25 LAB — GLUTAMIC ACID DECARBOXYLASE AUTO ABS: Glutamic Acid Decarb Ab: <5 IU/mL (ref <5)

## 2018-10-25 LAB — CK: Total CK: 37 U/L — ABNORMAL LOW (ref 44–196)

## 2018-10-25 LAB — TSH: TSH: 2.63 mIU/L (ref 0.40–4.50)

## 2018-10-25 LAB — MAGNESIUM: Magnesium: 1.8 mg/dL (ref 1.5–2.5)

## 2018-11-08 ENCOUNTER — Other Ambulatory Visit: Payer: Self-pay | Admitting: *Deleted

## 2018-11-08 ENCOUNTER — Other Ambulatory Visit: Payer: Self-pay | Admitting: Physician Assistant

## 2018-11-08 DIAGNOSIS — R7989 Other specified abnormal findings of blood chemistry: Secondary | ICD-10-CM

## 2018-11-13 LAB — HEMOCHROMATOSIS DNA-PCR(C282Y,H63D)

## 2018-11-15 NOTE — Progress Notes (Signed)
Diabetes Education and Follow-Up Visit  48 y.o.male presents for diabetic education. He has Diabetes Mellitus type 2 with hyperlipidemia.   He had elevated liver function last visit, 67 and 155.  Lab Results  Component Value Date   ALT 155 (H) 10/22/2018   AST 67 (H) 10/22/2018   ALKPHOS 63 10/30/2009   BILITOT 1.0 10/22/2018    He has had right leg pain x months, getting worse. Will have right dull leg pain, will feel lateral right leg down anterior to his lateral leg. Worse with crossing his legs, sitting for a long time.   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 He is still doing 1 energy drink in the AM but has reduced size. Does drink diet Mountain dew, 1 a day. .    Last hemoglobin A1c was: Lab Results  Component Value Date   HGBA1C 8.4 (H) 10/22/2018   HGBA1C 6.8 (H) 06/25/2018   HGBA1C 6.4 (H) 02/09/2018    Body mass index is 29.14 kg/m.  Pt is on a regimen of: Metformin only  Exercise: wants to start  Patient does not have CKD He is on ACE/ARB   Lab Results  Component Value Date   GFRNONAA 95 10/22/2018    Lab Results  Component Value Date   CREATININE 0.94 10/22/2018   BUN 17 10/22/2018   NA 138 10/22/2018   K 4.1 10/22/2018   CL 100 10/22/2018   CO2 26 10/22/2018    Lab Results  Component Value Date   MICROALBUR 0.4 06/25/2018     He is not on a Statin.  He is not at goal of less than 70.  Lab Results  Component Value Date   CHOL 181 10/22/2018   HDL 31 (L) 10/22/2018   LDLCALC 109 (H) 10/22/2018   TRIG 310 (H) 10/22/2018   CHOLHDL 5.8 (H) 10/22/2018     Problem List has Essential hypertension; Hyperlipidemia associated with type 2 diabetes mellitus ([place]); Type 2 diabetes mellitus with kidney complication ([place]); Vitamin D deficiency; Fatty liver; BMI 26.0-26.9,adult; Prostate nodule; Stage 2 chronic kidney disease due to type 2  diabetes mellitus ([place]); Pityriasis rosea; and Abnormal EKG on their problem list.  Medications Current Outpatient Medications on File Prior to Visit  Medication Sig  . APPLE CIDER VINEGAR PO Take 1 Dose by mouth daily.  . Cholecalciferol (VITAMIN D3) 5000 units CAPS Takes 2 caps daily  . Cinnamon 500 MG capsule Take 1,000 mg by mouth 2 (two) times daily.  . cyanocobalamin 500 MCG tablet Take 1 tablet daily  . ELDERBERRY PO Take by mouth 2 (two) times daily.  [place] lisinopril (PRINIVIL,ZESTRIL) 10 MG tablet TAKE 1 TABLET DAILY FOR BLOOD PRESSURE  . metFORMIN (GLUCOPHAGE XR) 500 MG 24 hr tablet Take 2 tablets 2 x / day for Diabetes  . Multiple Vitamins-Minerals (MULTIVITAMIN ADULT PO) Take by mouth.  . vitamin C (ASCORBIC ACID) 500 MG tablet Takes 1 tablet daily  . Armodafinil 150 MG tablet Take 1 tablet (150 mg total) by mouth daily. (Patient not taking: Reported on 11/19/2018)   No current facility-administered medications on file prior to visit.     ROS- see HPI  Physical Exam: Blood pressure 122/86, pulse 83, temperature 97.9 F (36.6 C), height 6' 2.5" (1.892 m), weight 230 lb (104.3 kg), SpO2 97 %. Body mass index is 29.14 kg/m. General  Appearance: Well nourished, in no apparent distress. Eyes: PERRLA, EOMs, conjunctiva no swelling or erythema ENT/Mouth: Ext aud canals clear, TMs without erythema, bulging. No erythema, swelling, or exudate on post pharynx.  Tonsils not swollen or erythematous. Hearing normal.  Respiratory: Respiratory effort normal, BS equal bilaterally without rales, rhonchi, wheezing or stridor.  Cardio: RRR with no MRGs. Brisk peripheral pulses without edema.  Abdomen: Soft, + BS.  Non tender, no guarding, rebound, hernias, masses. Musculoskeletal: Full ROM, 5/5 strength, normal gait.  Skin: Warm, dry without rashes, lesions, ecchymosis.  Neuro: Cranial nerves intact. Normal muscle tone, no cerebellar symptoms. Sensation intact.    Plan and Assessment: Left  leg pain He would like to do conservative treatment for his left leg pain, likely more piriformis syndrome, if fails conservative treatment will get imaging  Diabetes Education: Reviewed 'ABCs' of diabetes management (respective goals in parentheses):  A1C (<7), blood pressure (<130/80), and cholesterol (LDL <70) Eye Exam yearly and Dental Exam every 6 months. Dietary recommendations Physical Activity recommendations Will start phentermine for weight loss - declines statin at this time  Obesity - - follow up 2 months for progress monitoring - long discussion about weight loss, diet, and exercise, will start the patient on phentermine- hand out given and AE's discussed, will do close follow up.    Abnormal LFTs Repeat today Check labs, avoid tylenol, alcohol, weight loss advised.   Abnormal CT scan Repeat CT scan AB with contrast as suggested However likely granuloma from previous gallbladder issues  Future Appointments  Date Time Provider [place]  06/26/2019 10:00 AM Liane Comber, NP [facility] None

## 2018-11-19 ENCOUNTER — Ambulatory Visit (INDEPENDENT_AMBULATORY_CARE_PROVIDER_SITE_OTHER): Payer: Managed Care, Other (non HMO) | Admitting: Physician Assistant

## 2018-11-19 ENCOUNTER — Encounter: Payer: Self-pay | Admitting: Physician Assistant

## 2018-11-19 VITALS — BP 122/86 | HR 83 | Temp 97.9°F | Ht 74.5 in | Wt 230.0 lb

## 2018-11-19 DIAGNOSIS — R933 Abnormal findings on diagnostic imaging of other parts of digestive tract: Secondary | ICD-10-CM

## 2018-11-19 DIAGNOSIS — K760 Fatty (change of) liver, not elsewhere classified: Secondary | ICD-10-CM | POA: Diagnosis not present

## 2018-11-19 DIAGNOSIS — E1122 Type 2 diabetes mellitus with diabetic chronic kidney disease: Secondary | ICD-10-CM

## 2018-11-19 DIAGNOSIS — E1169 Type 2 diabetes mellitus with other specified complication: Secondary | ICD-10-CM | POA: Diagnosis not present

## 2018-11-19 DIAGNOSIS — E785 Hyperlipidemia, unspecified: Secondary | ICD-10-CM | POA: Diagnosis not present

## 2018-11-19 DIAGNOSIS — N182 Chronic kidney disease, stage 2 (mild): Secondary | ICD-10-CM

## 2018-11-19 DIAGNOSIS — I10 Essential (primary) hypertension: Secondary | ICD-10-CM | POA: Diagnosis not present

## 2018-11-19 MED ORDER — PHENTERMINE HCL 37.5 MG PO TABS: 37.5000 mg | ORAL_TABLET | Freq: Every day | ORAL | 2 refills | Status: DC

## 2018-11-19 NOTE — Patient Instructions (Addendum)
Get on magnesium 500 mg at night, if you get diarrhea take it with food. Magnesium may help with muscle cramps, constipation, vitamin D and potassium absorption.   Diet soda leads to weight gain.  We recently discovered that the artifical sugar in the soda stops an enzyme in your stomach that is suppose to signal that your brain is full. So patients that drink a lot of diet soda will never feel full and tend to over eat. So please cut back on diet soda and it can help with your weight loss.   A study found that two or more sodas a day increased the risk of hypertension, diabetes, stroke.   Another recent study found that higher consumption of soda and juice was associated with increased risk of cancer and breast cancer.   In animal studies artifical sweeteners affect the gut bacteria and may contribute to chronic disease this way.   Do these stretches, do heat on your buttocks, can do massage/icy hot, and do aleve once a day for 2 weeks If it is not better in 4-6 weeks we will get imaging  Aleve, ibuprofen is an antiinflammatory You can take tylenol (500mg ) or tylenol arthritis (650mg ) with the meloxicam/antiinflammatories. The max you can take of tylenol a day is 3000mg  daily, this is a max of 6 pills a day of the regular tyelnol (500mg ) or a max of 4 a day of the tylenol arthritis (650mg ) as long as no other medications you are taking contain tylenol.   this can cause inflammation in your stomach and can cause ulcers or bleeding, this will look like black tarry stools Make sure you taking it with food Try not to take it daily, take AS needed Can take with zantac     Piriformis Syndrome  Piriformis syndrome is a condition that can cause pain and numbness in your buttocks and down the back of your leg. Piriformis syndrome happens when the small muscle that connects the base of your spine to your hip (piriformis muscle) presses on the nerve that runs down the back of your leg (sciatic  nerve). The piriformis muscle helps your hip rotate and helps to bring your leg back and out. It also helps shift your weight while you are walking to keep you stable. The sciatic nerve runs under or through the piriformis. Damage to the piriformis muscle can cause spasms that put pressure on the nerve below. This causes pain and discomfort while sitting and moving. The pain may feel as if it begins in the buttock and spreads (radiates) down your hip and thigh. What are the causes? This condition is caused by pressure on the sciatic nerve from the piriformis muscle. The piriformis muscle can get irritated with overuse, especially if other hip muscles are weak and the piriformis has to do extra work. Piriformis syndrome can also occur after an injury, like a fall onto your buttocks. What increases the risk? This condition is more likely to develop in:  Women.  People who sit for long periods of time.  Cyclists.  People who have weak buttocks muscles (gluteal muscles). What are the signs or symptoms? Pain, tingling, or numbness that starts in the buttock and runs down the back of your leg (sciatica) is the most common symptom of this condition. Your symptoms may:  Get worse the longer you sit.  Get worse when you walk, run, or go up on stairs. How is this diagnosed? This condition is diagnosed based on your symptoms, medical  history, and physical exam. During this exam, your health care provider may move your leg into different positions to check for pain. He or she will also press on the muscles of your hip and buttock to see if that increases your symptoms. You may also have an X-ray or MRI. How is this treated? Treatment for this condition may include:  Stopping all activities that cause pain or make your condition worse.  Using heat or ice to relieve pain as told by your health care provider.  Taking medicines to reduce pain and swelling.  Taking a muscle relaxer to release the  piriformis muscle.  Doing range-of-motion and strengthening exercises (physical therapy) as told by your health care provider.  Massaging the affected area.  Getting an injection of an anti-inflammatory medicine or muscle relaxer to reduce inflammation and muscle tension. In rare cases, you may need surgery to cut the muscle and release pressure on the nerve if other treatments do not work. Follow these instructions at home:  Take over-the-counter and prescription medicines only as told by your health care provider.  Do not sit for long periods. Get up and walk around every 20 minutes or as often as told by your health care provider.  If directed, apply heat to the affected area as often as told by your health care provider. Use the heat source that your health care provider recommends, such as a moist heat pack or a heating pad. ? Place a towel between your skin and the heat source. ? Leave the heat on for 20-30 minutes. ? Remove the heat if your skin turns bright red. This is especially important if you are unable to feel pain, heat, or cold. You may have a greater risk of getting burned.  If directed, apply ice to the injured area. ? Put ice in a plastic bag. ? Place a towel between your skin and the bag. ? Leave the ice on for 20 minutes, 2-3 times a day.  Do exercises as told by your health care provider.  Return to your normal activities as told by your health care provider. Ask your health care provider what activities are safe for you.  Keep all follow-up visits as told by your health care provider. This is important. How is this prevented?  Do not sit for longer than 20 minutes at a time. When you sit, choose padded surfaces.  Warm up and stretch before being active.  Cool down and stretch after being active.  Give your body time to rest between periods of activity.  Make sure to use equipment that fits you.  Maintain physical fitness,  including: ? Strength. ? Flexibility. Contact a health care provider if:  Your pain and stiffness continue or get worse.  Your leg or hip becomes weak.  You have changes in your bowel function or bladder function. This information is not intended to replace advice given to you by your health care provider. Make sure you discuss any questions you have with your health care provider. Document Released: 09/05/2005 Document Revised: 05/10/2016 Document Reviewed: 08/18/2015 Elsevier Interactive Patient Education  2019 [employer]   Piriformis Syndrome Rehab Ask your health care provider which exercises are safe for you. Do exercises exactly as told by your health care provider and adjust them as directed. It is normal to feel mild stretching, pulling, tightness, or discomfort as you do these exercises, but you should stop right away if you feel sudden pain or your pain gets worse.Do not begin  these exercises until told by your health care provider. Stretching and range of motion exercises These exercises warm up your muscles and joints and improve the movement and flexibility of your hip and pelvis. These exercises also help to relieve pain, numbness, and tingling. Exercise A: Hip rotators  1. Lie on your back on a firm surface. 2. Pull your left / right knee toward your same shoulder with your left / right hand until your knee is pointing toward the ceiling. Hold your left / right ankle with your other hand. 3. Keeping your knee steady, gently pull your left / right ankle toward your other shoulder until you feel a stretch in your buttocks. 4. Hold this position for __________ seconds. Repeat __________ times. Complete this stretch __________ times a day. Exercise B: Hip extensors 1. Lie on your back on a firm surface. Both of your legs should be straight. 2. Pull your left / right knee to your chest. Hold your leg in this position by holding onto the back of your thigh or the front of  your knee. 3. Hold this position for __________ seconds. 4. Slowly return to the starting position. Repeat __________ times. Complete this stretch __________ times a day. Strengthening exercises These exercises build strength and endurance in your hip and thigh muscles. Endurance is the ability to use your muscles for a long time, even after they get tired. Exercise C: Straight leg raises (hip abductors)  1. Lie on your side with your left / right leg in the top position. Lie so your head, shoulder, knee, and hip line up. Bend your bottom knee to help you balance. 2. Lift your top leg up 4-6 inches (10-15 cm), keeping your toes pointed straight ahead. 3. Hold this position for __________ seconds. 4. Slowly lower your leg to the starting position. Let your muscles relax completely. Repeat __________ times. Complete this exercise__________ times a day. Exercise D: Hip abductors and rotators, quadruped  1. Get on your hands and knees on a firm, lightly padded surface. Your hands should be directly below your shoulders, and your knees should be directly below your hips. 2. Lift your left / right knee out to the side. Keep your knee bent. Do not twist your body. 3. Hold this position for __________ seconds. 4. Slowly lower your leg. Repeat __________ times. Complete this exercise__________ times a day. Exercise E: Straight leg raises (hip extensors) 1. Lie on your abdomen on a bed or a firm surface with a pillow under your hips. 2. Squeeze your buttock muscles and lift your left / right thigh off the bed. Do not let your back arch. 3. Hold this position for __________ seconds. 4. Slowly return to the starting position. Let your muscles relax completely before doing another repetition. Repeat __________ times. Complete this exercise__________ times a day. This information is not intended to replace advice given to you by your health care provider. Make sure you discuss any questions you have with  your health care provider. Document Released: 09/05/2005 Document Revised: 05/10/2016 Document Reviewed: 08/18/2015 Elsevier Interactive Patient Education  2019 [employer].  Statin therapy  In addition to the known lipid-lowering effects, statins are now widely accepted to have anti-inflammatory and immunomodulatory effects. Adjunctive use of statins has proven beneficial in the context of a wide range of inflammatory diseases, including rheumatoid arthritis.  Research has shown that the salutary effect of statins on cholesterol may not be their only benefit. Statin therapy has shown promise for everything from fighting  viral infections to protecting the eye from cataracts.  A 2005 study of more than 700 hospital patients being treated for pneumonia found that the death rate was more than twice as high among those who were not using statins. In 2006, a [place] study examined the rate of sepsis, a deadly blood infection, among patients who had been hospitalized for heart events. In the two years after their hospitalization, the statin users had a rate of sepsis 19% lower than that of the non-statin users. A 2009 review of 22 studies found that statins appeared to have a beneficial effect on the outcome of infection, but they couldn't come to a firm conclusion.  Phentermine  While taking the medication we may ask that you come into the office once a month for the first month for a blood pressure check and EKG.   Also please bring in a food log for that visit to review. It is helpful if you bring in a food diary or use an app on your phone such as myfitnesspal to record your calorie intake, especially in the beginning. BRING FOR YOUR FIRST VISIT.  After that first initial visit, we will want to see you once every 2-3 months to monitor your weight, blood pressure, and heart rate.   In addition we can help answer your questions about diet, exercise, and help you every step of the way with your  weight loss journey.  You can start out on 1/3 to 1/2 a pill in the morning and if you are tolerating it well you can increase to one pill daily. I also have some patients that take 1/3 or 1/2 at lunch to help prevent night time eating.  This medication is cheapest CASH pay at [facility] is 14-17 dollars and you do NOT need a membership to get meds from there.   It causes dry mouth and constipation in almost every patient, so try to get 80-100 oz of water a day and increase fiber such as veggies. You can add on a stool softener if you would like.   It can give you energy however it can also cause some people to be shaky, anxious or have palpitations. Stop this medication if that happens and contact the office.   If this medication does not work for you there are several medications that we can try to help rewire your brain in addition to making healthier habits.   What is this medicine? PHENTERMINE (FEN ter meen) decreases your appetite. This medicine is intended to be used in addition to a healthy reduced calorie diet and exercise. The best results are achieved this way. This medicine is only indicated for short-term use. Eventually your weight loss may level out and the medication will no longer be needed.   How should I use this medicine? Take this medicine by mouth. Follow the directions on the prescription label. The tablets should stay in the bottle until immediately before you take your dose. Take your doses at regular intervals. Do not take your medicine more often than directed.  Overdosage: If you think you have taken too much of this medicine contact a poison control center or emergency room at once. NOTE: This medicine is only for you. Do not share this medicine with others.  What if I miss a dose? If you miss a dose, take it as soon as you can. If it is almost time for your next dose, take only that dose. Do not take double  or extra doses. Do not increase or in  any way change your dose without consulting your doctor.  What should I watch for while using this medicine? Notify your physician immediately if you become short of breath while doing your normal activities. Do not take this medicine within 6 hours of bedtime. It can keep you from getting to sleep. Avoid drinks that contain caffeine and try to stick to a regular bedtime every night. Do not stand or sit up quickly, especially if you are an older patient. This reduces the risk of dizzy or fainting spells. Avoid alcoholic drinks.  What side effects may I notice from receiving this medicine? Side effects that you should report to your doctor or health care professional as soon as possible: -chest pain, palpitations -depression or severe changes in mood -increased blood pressure -irritability -nervousness or restlessness -severe dizziness -shortness of breath -problems urinating -unusual swelling of the legs -vomiting  Side effects that usually do not require medical attention (report to your doctor or health care professional if they continue or are bothersome): -blurred vision or other eye problems -changes in sexual ability or desire -constipation or diarrhea -difficulty sleeping -dry mouth or unpleasant taste -headache -nausea This list may not describe all possible side effects. Call your doctor for medical advice about side effects. You may report side effects to FDA at 1-800-FDA-1088.

## 2018-11-20 ENCOUNTER — Other Ambulatory Visit: Payer: Self-pay | Admitting: Physician Assistant

## 2018-11-20 DIAGNOSIS — R945 Abnormal results of liver function studies: Secondary | ICD-10-CM

## 2018-11-20 DIAGNOSIS — R7989 Other specified abnormal findings of blood chemistry: Secondary | ICD-10-CM

## 2018-11-20 DIAGNOSIS — M5441 Lumbago with sciatica, right side: Secondary | ICD-10-CM

## 2018-11-20 LAB — CBC WITH DIFFERENTIAL/PLATELET
Absolute Monocytes: 687 cells/uL (ref 200–950)
Basophils Absolute: 101 cells/uL (ref 0–200)
Basophils Relative: 1 %
Eosinophils Absolute: 323 cells/uL (ref 15–500)
Eosinophils Relative: 3.2 %
HCT: 46.9 % (ref 38.5–50.0)
Hemoglobin: 16.2 g/dL (ref 13.2–17.1)
Lymphs Abs: 3828 cells/uL (ref 850–3900)
MCH: 30 pg (ref 27.0–33.0)
MCHC: 34.5 g/dL (ref 32.0–36.0)
MCV: 86.9 fL (ref 80.0–100.0)
MPV: 11.5 fL (ref 7.5–12.5)
Monocytes Relative: 6.8 %
Neutro Abs: 5161 cells/uL (ref 1500–7800)
Neutrophils Relative %: 51.1 %
Platelets: 240 10*3/uL (ref 140–400)
RBC: 5.4 10*6/uL (ref 4.20–5.80)
RDW: 12.5 % (ref 11.0–15.0)
Total Lymphocyte: 37.9 %
WBC: 10.1 10*3/uL (ref 3.8–10.8)

## 2018-11-20 LAB — COMPLETE METABOLIC PANEL WITH GFR
AG Ratio: 1.6 (calc) (ref 1.0–2.5)
ALT: 173 U/L — ABNORMAL HIGH (ref 9–46)
AST: 109 U/L — ABNORMAL HIGH (ref 10–40)
Albumin: 4.5 g/dL (ref 3.6–5.1)
Alkaline phosphatase (APISO): 55 U/L (ref 36–130)
BUN: 16 mg/dL (ref 7–25)
CO2: 23 mmol/L (ref 20–32)
Calcium: 10.4 mg/dL — ABNORMAL HIGH (ref 8.6–10.3)
Chloride: 104 mmol/L (ref 98–110)
Creat: 1.03 mg/dL (ref 0.60–1.35)
GFR, Est African American: 99 mL/min/{1.73_m2} (ref >=60)
GFR, Est Non African American: 85 mL/min/{1.73_m2} (ref >=60)
Globulin: 2.8 g/dL (calc) (ref 1.9–3.7)
Glucose, Bld: 165 mg/dL — ABNORMAL HIGH (ref 65–99)
Potassium: 4.3 mmol/L (ref 3.5–5.3)
Sodium: 137 mmol/L (ref 135–146)
Total Bilirubin: 0.5 mg/dL (ref 0.2–1.2)
Total Protein: 7.3 g/dL (ref 6.1–8.1)

## 2018-12-17 ENCOUNTER — Other Ambulatory Visit: Payer: Self-pay | Admitting: Internal Medicine

## 2018-12-17 DIAGNOSIS — E119 Type 2 diabetes mellitus without complications: Secondary | ICD-10-CM

## 2018-12-21 ENCOUNTER — Ambulatory Visit
Admission: RE | Admit: 2018-12-21 | Discharge: 2018-12-21 | Disposition: A | Discharge status: Home / Self Care | Payer: Managed Care, Other (non HMO) | Source: Ambulatory Visit | Attending: Physician Assistant | Admitting: Physician Assistant

## 2018-12-21 ENCOUNTER — Other Ambulatory Visit: Payer: Self-pay

## 2018-12-21 ENCOUNTER — Other Ambulatory Visit: Payer: Managed Care, Other (non HMO)

## 2018-12-21 DIAGNOSIS — R933 Abnormal findings on diagnostic imaging of other parts of digestive tract: Secondary | ICD-10-CM

## 2018-12-21 MED ORDER — IOPAMIDOL (ISOVUE-300) INJECTION 61%
100.0000 mL | Freq: Once | INTRAVENOUS | Status: AC | PRN
  Administered 2018-12-21: 100 mL via INTRAVENOUS

## 2018-12-31 ENCOUNTER — Other Ambulatory Visit (INDEPENDENT_AMBULATORY_CARE_PROVIDER_SITE_OTHER): Payer: Managed Care, Other (non HMO)

## 2018-12-31 ENCOUNTER — Other Ambulatory Visit: Payer: Self-pay

## 2018-12-31 DIAGNOSIS — K760 Fatty (change of) liver, not elsewhere classified: Secondary | ICD-10-CM

## 2018-12-31 DIAGNOSIS — R7989 Other specified abnormal findings of blood chemistry: Secondary | ICD-10-CM

## 2018-12-31 DIAGNOSIS — R945 Abnormal results of liver function studies: Principal | ICD-10-CM

## 2019-01-02 LAB — COMPLETE METABOLIC PANEL WITH GFR
AG Ratio: 1.8 (calc) (ref 1.0–2.5)
ALT: 110 U/L — ABNORMAL HIGH (ref 9–46)
AST: 62 U/L — ABNORMAL HIGH (ref 10–40)
Albumin: 4.3 g/dL (ref 3.6–5.1)
Alkaline phosphatase (APISO): 51 U/L (ref 36–130)
BUN: 13 mg/dL (ref 7–25)
CO2: 23 mmol/L (ref 20–32)
Calcium: 9 mg/dL (ref 8.6–10.3)
Chloride: 104 mmol/L (ref 98–110)
Creat: 0.97 mg/dL (ref 0.60–1.35)
GFR, Est African American: 107 mL/min/{1.73_m2} (ref >=60)
GFR, Est Non African American: 92 mL/min/{1.73_m2} (ref >=60)
Globulin: 2.4 g/dL (calc) (ref 1.9–3.7)
Glucose, Bld: 338 mg/dL — ABNORMAL HIGH (ref 65–99)
Potassium: 4.2 mmol/L (ref 3.5–5.3)
Sodium: 136 mmol/L (ref 135–146)
Total Bilirubin: 0.6 mg/dL (ref 0.2–1.2)
Total Protein: 6.7 g/dL (ref 6.1–8.1)

## 2019-01-02 LAB — ANTI-DNA ANTIBODY, DOUBLE-STRANDED: ds DNA Ab: <1 IU/mL

## 2019-01-02 LAB — HEPATITIS B SURFACE ANTIBODY,QUALITATIVE: Hep B S Ab: NONREACTIVE

## 2019-01-02 LAB — HEPATITIS B CORE ANTIBODY, TOTAL: Hep B Core Total Ab: NONREACTIVE

## 2019-01-02 LAB — HEPATITIS C ANTIBODY
Hepatitis C Ab: NONREACTIVE
SIGNAL TO CUT-OFF: 0.01 (ref <1.00)

## 2019-01-02 LAB — HEPATITIS B E ANTIBODY: Hep B E Ab: NONREACTIVE

## 2019-01-02 LAB — ANA: Anti Nuclear Antibody (ANA): NEGATIVE

## 2019-01-02 LAB — HEPATITIS A ANTIBODY, TOTAL: Hepatitis A AB,Total: NONREACTIVE

## 2019-01-03 LAB — EPSTEIN-BARR VIRUS VCA ANTIBODY PANEL
EBV NA IgG: 194 U/mL — ABNORMAL HIGH
EBV VCA IgG: 89.6 U/mL — ABNORMAL HIGH
EBV VCA IgM: <36 U/mL

## 2019-01-03 LAB — GAMMA GT: GGT: 54 U/L (ref 3–95)

## 2019-01-03 LAB — ANTI-SMOOTH MUSCLE ANTIBODY, IGG: Actin (Smooth Muscle) Antibody (IGG): <20 U (ref <20)

## 2019-01-03 LAB — MITOCHONDRIAL ANTIBODIES: Mitochondrial M2 Ab, IgG: <=20 U

## 2019-01-25 ENCOUNTER — Ambulatory Visit: Payer: Self-pay | Admitting: Physician Assistant

## 2019-01-28 NOTE — Progress Notes (Deleted)
FOLLOW UP  Assessment and Plan:  Essential hypertension - continue medications, DASH diet, exercise and monitor at home. Call if greater than 130/80.  Continue ACE -     CBC with Differential/Platelet -     COMPLETE METABOLIC PANEL WITH GFR -     TSH  Hyperlipidemia associated with type 2 diabetes mellitus (HCC) check lipids decrease fatty foods increase activity.  - discussed NOT at goal at this time, does not want medications, will try to do better diet -     Lipid panel  Type 2 diabetes mellitus with stage 2 chronic kidney disease, without long-term current use of insulin (HCC) Discussed general issues about diabetes pathophysiology and management., Educational material distributed., Suggested low cholesterol diet., Encouraged aerobic exercise., Discussed foot care., Reminded to get yearly retinal exam. Rule out LADA DM x 5-6 years that he knows of Goal to get off metformin -     Hemoglobin A1c -     Glutamic acid decarboxylase auto abs  Stage 2 chronic kidney disease due to type 2 diabetes mellitus ([place]) Discussed general issues about diabetes pathophysiology and management., Educational material distributed., Suggested low cholesterol diet., Encouraged aerobic exercise., Discussed foot care., Reminded to get yearly retinal exam.  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  Cough Likely post viral, lungs CTAB, given breo, if not better will take zpak  Continue diet and meds as discussed. Further disposition pending results of labs. Discussed med's effects and SE's.   Over 30 minutes of exam, counseling, chart review, and critical decision making was performed.   Future  Appointments  Date Time Provider [place]  01/31/2019 11:00 AM Vicie Mutters, PA-C [facility] None  06/26/2019 10:00 AM Liane Comber, NP [facility] None    ----------------------------------------------------------------------------------------------------------------------  HPI 48 y.o. male  presents for 3 month follow up on hypertension, cholesterol, diabetes, weight and vitamin D deficiency.   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 He has not been sleeping as much. Patient has his CDL and states if he does not get enough sleep he will drink a lot of sugary drinks to stay awake.   BMI is There is no height or weight on file to calculate BMI., he is working on diet and exercise. Wt Readings from Last 3 Encounters:  11/19/18 230 lb (104.3 kg)  10/22/18 229 lb 12.8 oz (104.2 kg)  06/25/18 229 lb (103.9 kg)   Patient has had recurrent prostatitis, following with urology, had negative prostate biopsy.   He had recent influenza 1 week ago, daughter tested +, now having symptoms. He is having wheezing, cough, some SOB but not gasping for air and no CP.  No recent fever or chills.   His blood pressure has been controlled at home, today their BP is    He does not workout but work physically active job. He denies chest pain, shortness of breath, dizziness.   He is not on cholesterol medication and denies myalgias. His cholesterol is at goal. The cholesterol last visit was:   Lab Results  Component Value Date  CHOL 181 10/22/2018   HDL 31 (L) 10/22/2018   LDLCALC 109 (H) 10/22/2018   TRIG 310 (H) 10/22/2018   CHOLHDL 5.8 (H) 10/22/2018    He has been working on diet and exercise for T2 diabetes on metformin, and denies foot ulcerations, increased appetite, nausea, paresthesia of the feet, polydipsia, polyuria, visual disturbances, vomiting and weight loss. He does not currently check glucose at home very consistently.  Last  A1C in the office was:  Lab Results  Component Value Date   HGBA1C 8.4 (H) 10/22/2018     Lab Results  Component Value Date   GFRNONAA 92 12/31/2018   Patient is on Vitamin D supplement and at goal at recent check:    Lab Results  Component Value Date   VD25OH 45 06/25/2018        Current Medications:  Current Outpatient Medications on File Prior to Visit  Medication Sig  . APPLE CIDER VINEGAR PO Take 1 Dose by mouth daily.  . Armodafinil 150 MG tablet Take 1 tablet (150 mg total) by mouth daily. (Patient not taking: Reported on 11/19/2018)  . Cholecalciferol (VITAMIN D3) 5000 units CAPS Takes 2 caps daily  . Cinnamon 500 MG capsule Take 1,000 mg by mouth 2 (two) times daily.  . cyanocobalamin 500 MCG tablet Take 1 tablet daily  . ELDERBERRY PO Take by mouth 2 (two) times daily.  Marland Kitchen lisinopril (PRINIVIL,ZESTRIL) 10 MG tablet TAKE 1 TABLET DAILY FOR BLOOD PRESSURE  . metFORMIN (GLUCOPHAGE-XR) 500 MG 24 hr tablet TAKE 2 TABLETS BY MOUTH TWICE A DAY FOR DIABETES  . Multiple Vitamins-Minerals (MULTIVITAMIN ADULT PO) Take by mouth.  . phentermine (ADIPEX-P) 37.5 MG tablet Take 1 tablet (37.5 mg total) by mouth daily before breakfast.  . vitamin C (ASCORBIC ACID) 500 MG tablet Takes 1 tablet daily   No current facility-administered medications on file prior to visit.      Allergies: No Known Allergies   Medical History:  Past Medical History:  Diagnosis Date  . Diabetes mellitus without complication ([place]) 4081  . Fatty liver   . Hypertension 2012   Family history- Reviewed and unchanged Social history- Reviewed and unchanged   Review of Systems:  Review of Systems  Constitutional: Negative for malaise/fatigue and weight loss.  HENT: Negative for hearing loss and tinnitus.   Eyes: Negative for blurred vision and double vision.  Respiratory: Negative for cough, shortness of breath and wheezing.   Cardiovascular: Negative for chest pain, palpitations, orthopnea,  claudication and leg swelling.  Gastrointestinal: Negative for abdominal pain, blood in stool, constipation, diarrhea, heartburn, melena, nausea and vomiting.  Genitourinary: Negative.   Musculoskeletal: Negative for joint pain and myalgias.  Skin: Positive for rash (Rash to back, chronic/recurrent).  Neurological: Negative for dizziness, tingling, sensory change, weakness and headaches.  Endo/Heme/Allergies: Negative for polydipsia.  Psychiatric/Behavioral: Negative.   All other systems reviewed and are negative.   Physical Exam: There were no vitals taken for this visit. Wt Readings from Last 3 Encounters:  11/19/18 230 lb (104.3 kg)  10/22/18 229 lb 12.8 oz (104.2 kg)  06/25/18 229 lb (103.9 kg)   General Appearance: Well nourished, in no apparent distress. Eyes: PERRLA, EOMs, conjunctiva no swelling or erythema Sinuses: No Frontal/maxillary tenderness ENT/Mouth: Ext aud canals clear, TMs without erythema, bulging. No erythema, swelling, or exudate on post pharynx.  Tonsils not swollen or erythematous. Hearing normal.  Neck: Supple, thyroid normal.  Respiratory: Respiratory effort normal, BS equal bilaterally without rales, rhonchi, wheezing or stridor.  Cardio: RRR with no MRGs. Brisk peripheral pulses without edema.  Abdomen: Soft, + BS.  Non tender, no guarding, rebound, hernias, masses. Lymphatics: Non tender without lymphadenopathy.  Musculoskeletal: Full ROM, 5/5 strength, Normal gait Skin: Warm, dry, no lesions, ecchymosis. He does have rash to back; multiple round erythematous faintly raised lesions with some scaling, non-pruritic Neuro: Cranial nerves intact. No cerebellar symptoms.  Psych: Awake and oriented X 3, normal affect, Insight and Judgment appropriate.    Vicie Mutters, PA-C 1:36 PM [facility]

## 2019-01-31 ENCOUNTER — Ambulatory Visit: Payer: Self-pay | Admitting: Physician Assistant

## 2019-02-10 ENCOUNTER — Other Ambulatory Visit: Payer: Self-pay | Admitting: Adult Health

## 2019-04-27 IMAGING — CT CT RENAL STONE PROTOCOL
2 of 4 series · 16 of 46 positions shown, 18 images · non-contrast
Comparison: None.

CLINICAL DATA: 46-year-old with flank pain and fever.

EXAM:
CT ABDOMEN AND PELVIS WITHOUT CONTRAST
TECHNIQUE: Multidetector CT imaging of the abdomen and pelvis was performed
following the standard protocol without IV contrast.

[Series 3: ap without · axial · non-contrast · 0.77mm/px · z∈[+668,+1143]mm · 13 of 107 slices shown, 15 images]
[im 6/107  soft-tissue]
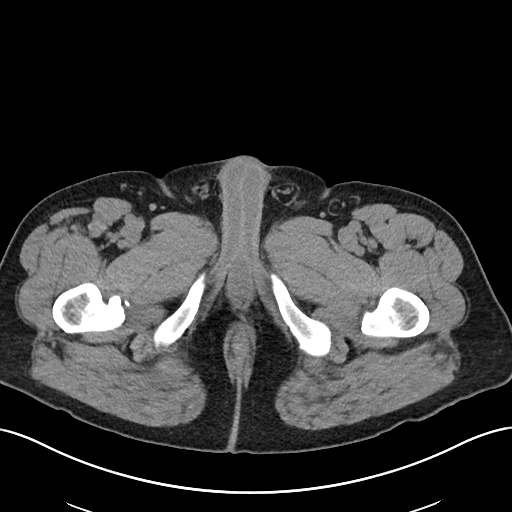
[im 6/107  bone]
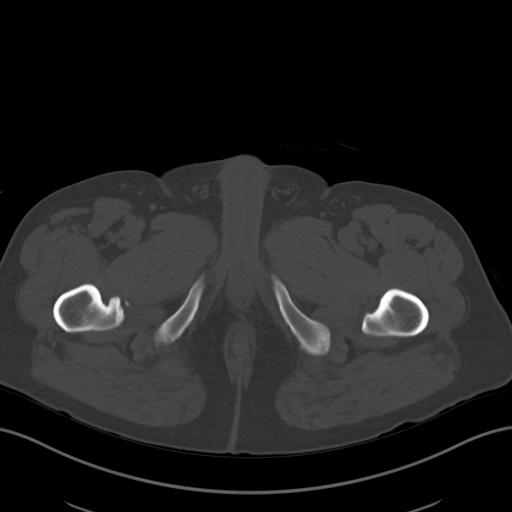
[im 17/107  soft-tissue]
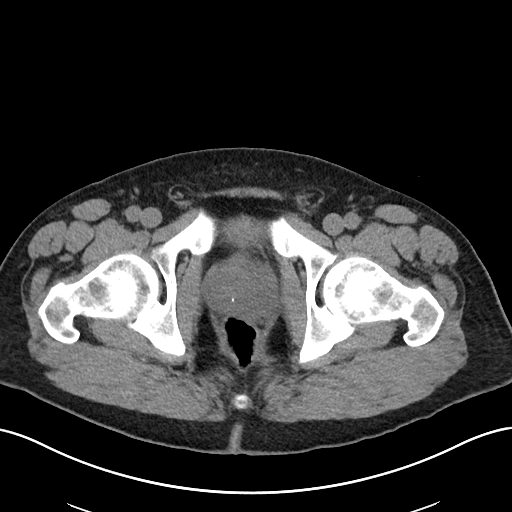
[im 23/107  soft-tissue]
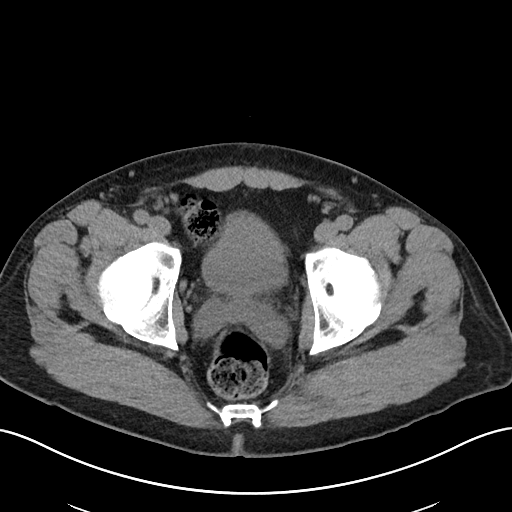
[im 28/107  soft-tissue]
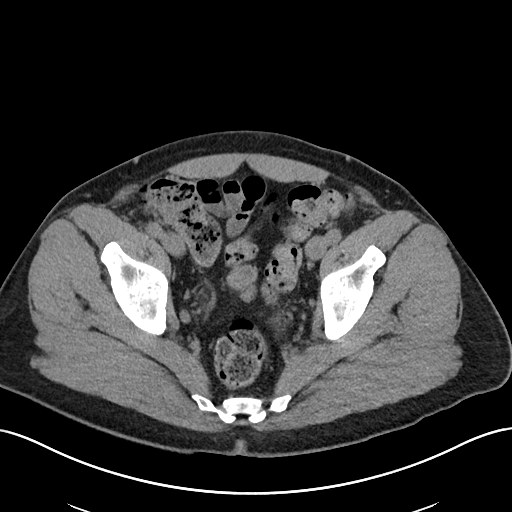
[im 40/107  soft-tissue]
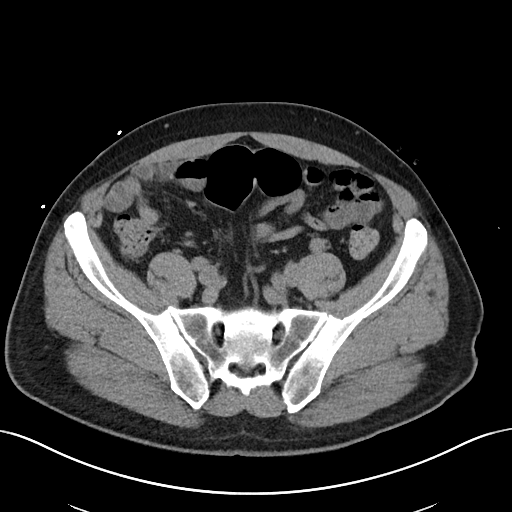
[im 45/107  soft-tissue]
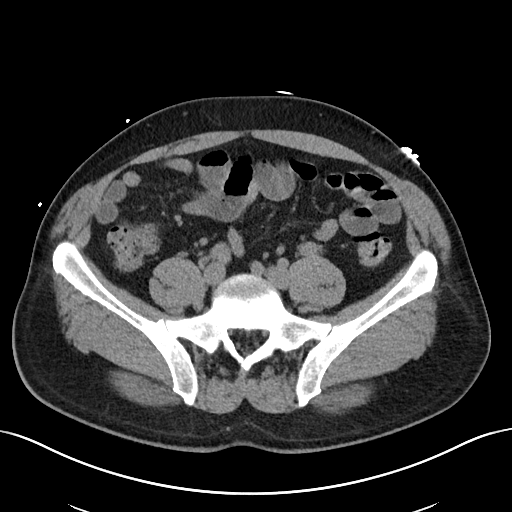
[im 56/107  soft-tissue]
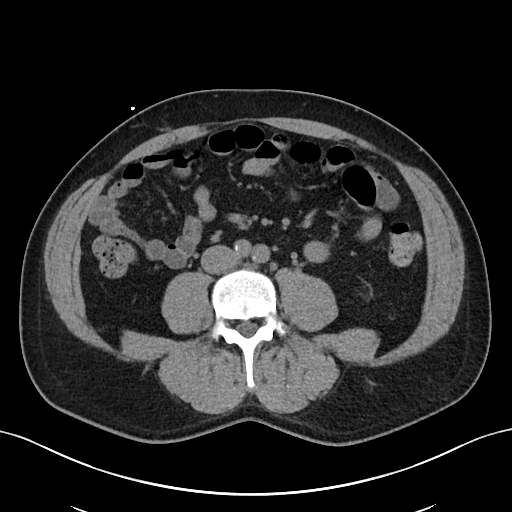
[im 62/107  soft-tissue]
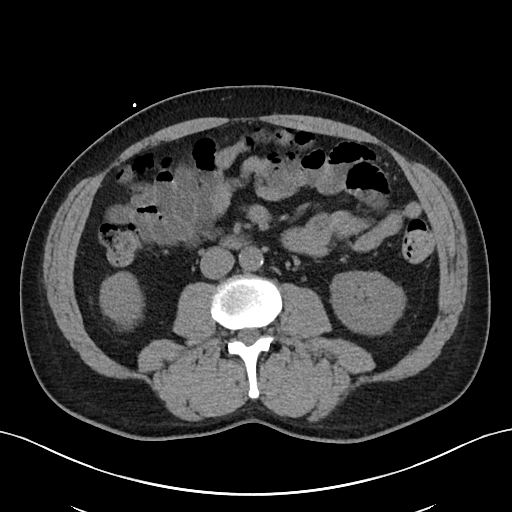
[im 67/107  soft-tissue]
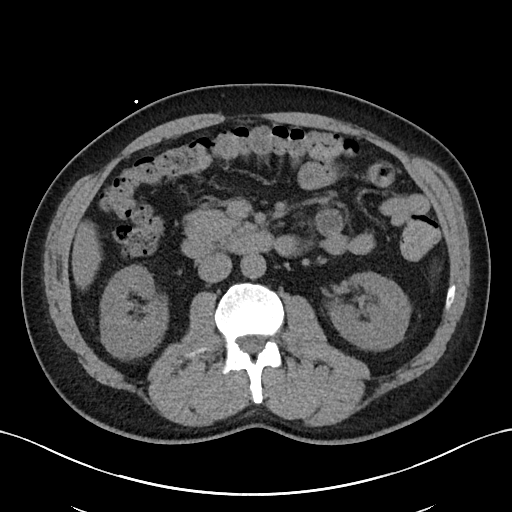
[im 67/107  bone]
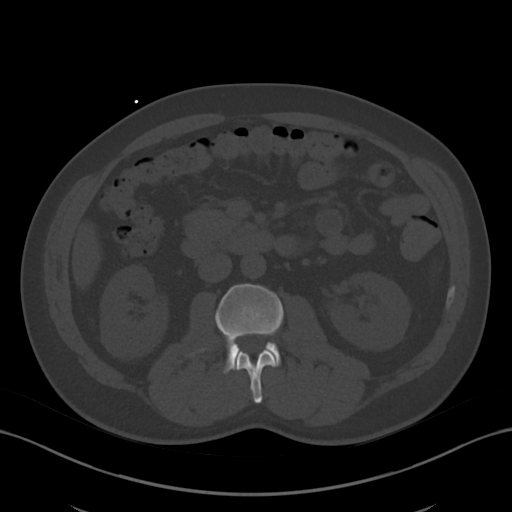
[im 79/107  soft-tissue]
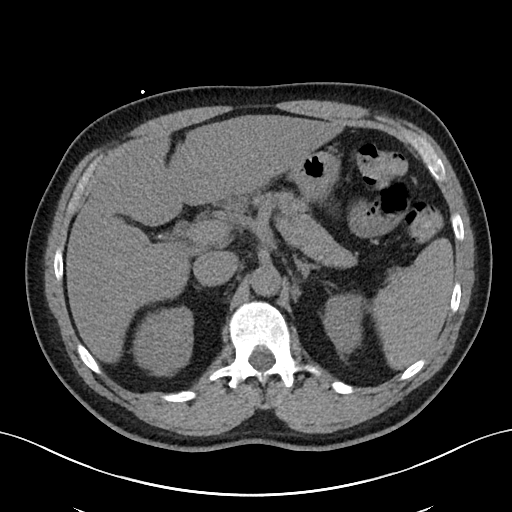
[im 84/107  soft-tissue]
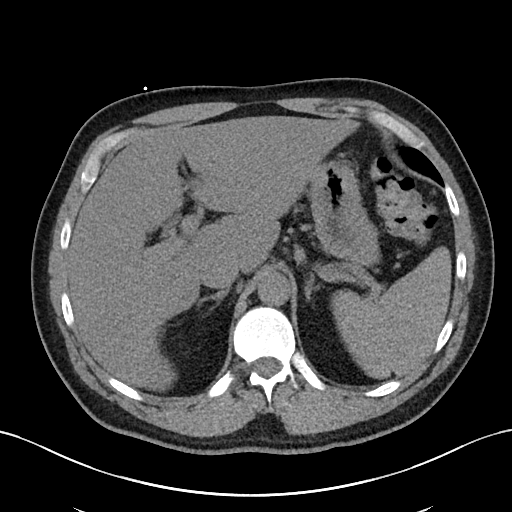
[im 90/107  soft-tissue]
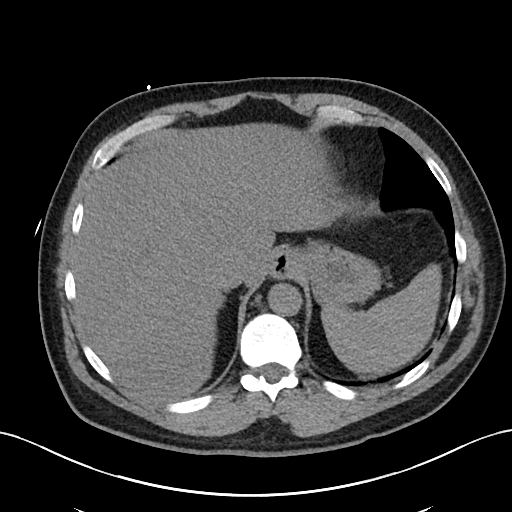
[im 101/107  soft-tissue]
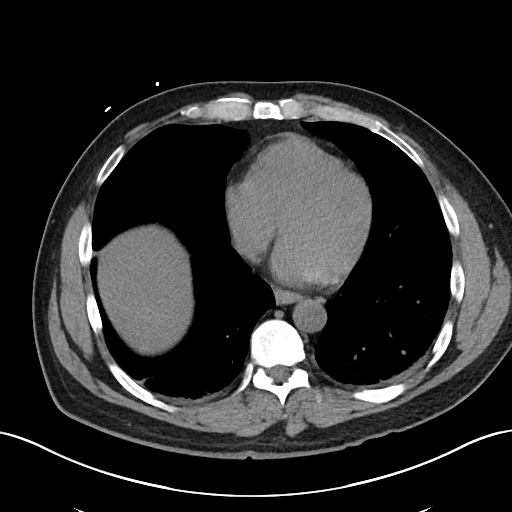

[Series 6: cor · coronal · 0.87mm/px · 3 of 101 slices shown]
[im 34/101  soft-tissue]
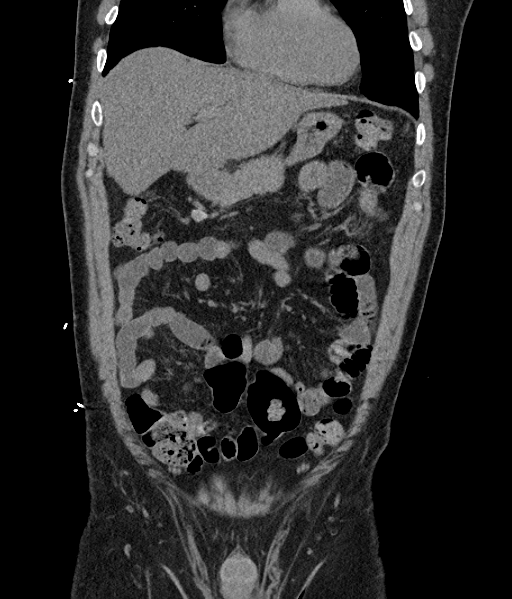
[im 45/101  soft-tissue]
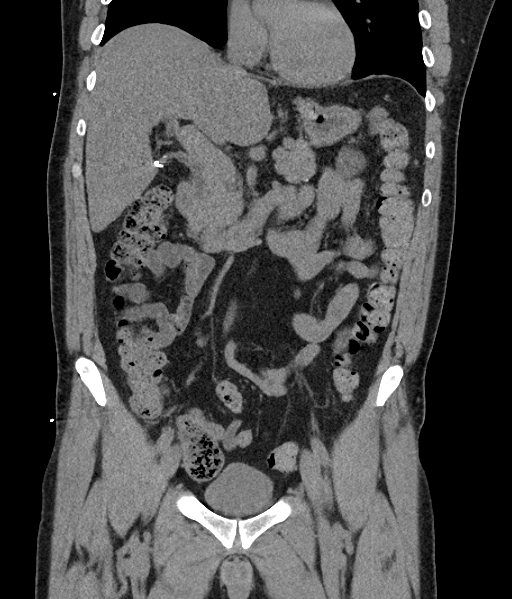
[im 56/101  soft-tissue]
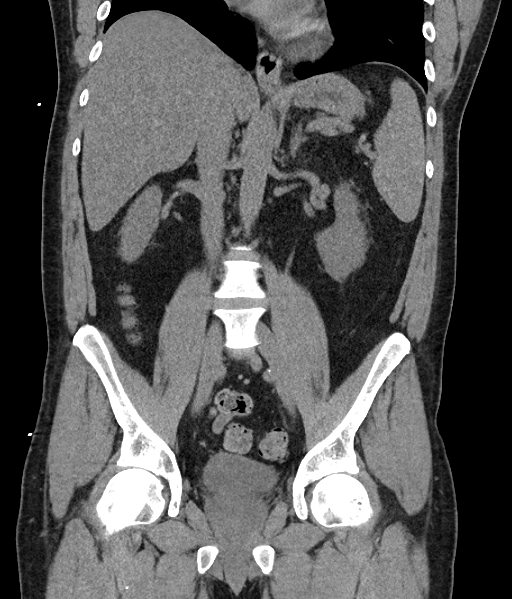

[16 of 46 positions shown; findings below may reference images not displayed]

FINDINGS: Lower chest: Dependent densities at both lung bases are suggestive
for atelectasis. Coronary artery calcifications in the LAD.

Hepatobiliary: Gallbladder has been removed. Normal appearance of
the liver and no significant biliary dilatation.

Pancreas: Normal appearance of the pancreas without inflammation or
duct dilatation.

Spleen: Normal appearance of spleen without enlargement.

Adrenals/Urinary Tract: Normal adrenal glands. Low-density structure
along the lateral aspect of the right kidney is indeterminate but
could represent a cyst with peripheral calcifications.. Negative for
hydronephrosis and no kidney stones. Normal appearance of the
ureters. The urinary bladder is unremarkable.

Stomach/Bowel: Stomach is within normal limits. Appendix appears
normal. No evidence of bowel wall thickening, distention, or
inflammatory changes.

Vascular/Lymphatic: Atherosclerotic calcifications in the abdominal
aorta without aneurysm. There is a dense nodular structure in the
anterior right upper abdomen just posterior to the right transverse
colon. This nodule measures roughly 1.5 cm and appears to be within
the mesentery. The borders of this nodule are relatively smooth.
Otherwise, there is no significant lymph node enlargement.

Reproductive: Prostate is prominent for size measuring 5.9 cm in the
transverse dimension. Few calcifications in the prostate.

Other: No free fluid.  Negative for free air.

Musculoskeletal: No acute bone abnormality.
IMPRESSION: No acute abnormality within the abdomen or pelvis. Specifically, no
evidence for kidney stones or hydronephrosis.

**An incidental finding of potential clinical significance has been
found. Indeterminate 1.5 cm nodular density in the right upper
abdominal mesentery. This structure is dense and could be partially
calcified. Findings are nonspecific but could be related to prior
inflammation and represent a granuloma. Consider a 6 month follow-up
with IV contrast to ensure stability. **

Probable right renal cyst but this low-density renal lesion is
indeterminate on this noncontrast examination.

Prominent prostate.

## 2019-04-28 ENCOUNTER — Other Ambulatory Visit: Payer: Self-pay | Admitting: Internal Medicine

## 2019-04-28 DIAGNOSIS — I10 Essential (primary) hypertension: Secondary | ICD-10-CM

## 2019-05-16 ENCOUNTER — Other Ambulatory Visit: Payer: Self-pay | Admitting: Adult Health Nurse Practitioner

## 2019-05-16 MED ORDER — NITROGLYCERIN 0.4 MG SL SUBL: 0.4000 mg | SUBLINGUAL_TABLET | SUBLINGUAL | 3 refills | Status: DC | PRN

## 2019-05-16 NOTE — Progress Notes (Signed)
Medication discussed, risks benefits.  Hospital precautions discussed.  Garnet Sierras, NP [facility] 05/16/2019  12:00 PM

## 2019-06-06 ENCOUNTER — Encounter: Payer: Self-pay | Admitting: Adult Health

## 2019-06-26 ENCOUNTER — Encounter: Payer: Self-pay | Admitting: Adult Health

## 2019-07-08 ENCOUNTER — Other Ambulatory Visit: Payer: Self-pay | Admitting: Internal Medicine

## 2019-07-08 DIAGNOSIS — E119 Type 2 diabetes mellitus without complications: Secondary | ICD-10-CM

## 2019-07-29 NOTE — Progress Notes (Signed)
Complete Physical   Assessment and Plan:   Gregory Jacobs was seen today for annual exam.  Diagnoses and all orders for this visit:  Essential hypertension Hypertension Continue medication: Lisinopril Discussed D/C medication and monitoring.  Patient wishes not to be on medication.  Agreable to this IF monitoring blood pressure twice a day and recording. Has upcoming DOT physical Monitor blood pressure at home; call if consistently over 130/80 Continue DASH diet.   Reminder to go to the ER if any CP, SOB, nausea, dizziness, severe HA, changes vision/speech, left arm numbness and tingling and jaw pain. -     CBC with Diff -     COMPLETE METABOLIC PANEL WITH GFR -     TSH -     Magnesium -     EKG 12-Lead -     Microalbumin / Creatinine Urine Ratio  Hyperlipidemia associated with type 2 diabetes mellitus (HCC) Cholesterol No medications at this time, DMII, not able to take statin related to NAFLD and elevated LFT's. Consider high dose fish oil? Continue low cholesterol diet and exercise.  Check lipid panel.  -     Lipid Profile  Type 2 diabetes mellitus with stage 2 chronic kidney disease, without long-term current use of insulin (HCC) Continue medications: Metformin XR500mg , two tablets twice a day. Discussed general issues about diabetes pathophysiology and management. Education: Reviewed 'ABCs' of diabetes management (respective goals in parentheses):  A1C (<7), blood pressure (<130/80), and cholesterol (LDL <70) Dietary recommendations Encouraged aerobic exercise.  Discussed foot care, check daily Yearly retinal exam Dental exam every 6 months Monitor blood glucose, discussed goal for patient -     Hemoglobin A1c ([facility]) -     Insulin, random  Stage 2 chronic kidney disease due to type 2 diabetes mellitus (HCC) Increase fluids  Avoid NSAIDS Blood pressure control Monitor sugars  Will continue to monitor  BMI 28.0-28.9,adult Discussed dietary and exercise  modifications Long discussion regarding dietary changes, do not skip meals.  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 Further disposition pending results of labs. Discussed med's effects and SE's.  Patient agrees with plan of care and opportunity to ask questions/voice concerns. Over 30 minutes of chart review, interview, exam, counseling, and critical decision making was performed.   Future Appointments  Date Time Provider [place]  09/03/2019  8:45 AM Garnet Sierras, NP [facility] None  08/04/2020  3:00 PM Alexius Ellington, Danton Sewer, NP [facility] None    ----------------------------------------------------------------------------------------------------------------------  HPI 48 y.o. male  presents for 3 month follow up on HTN, HLD, DMII weight and vitamin D deficiency.   BMI is Body mass index is 28.73 kg/m., he has been working on diet and exercise. Wt Readings from Last 3 Encounters:  07/30/19 226 lb 12.8 oz (102.9 kg)  11/19/18 230 lb (104.3 kg)  10/22/18 229 lb 12.8 oz (104.2 kg)  His blood pressure has been controlled at  home, today their BP is BP: 122/82  He does not workout.  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  He denies any cardiac symptoms, chest pains, palpitations, shortness of breath,  dizziness or lower extremity edema.     He is not on cholesterol medication  and denies myalgias. His cholesterol is not at goal. He has history of NAFLD and monitoring enzymes. The cholesterol last visit was:   Lab Results  Component Value Date   CHOL 181 10/22/2018   HDL 31 (L) 10/22/2018   LDLCALC 109 (H) 10/22/2018   TRIG 310 (H) 10/22/2018   CHOLHDL 5.8 (H) 10/22/2018    He has not been working on diet and exercise for prediabetes, and denies nausea, paresthesia of the feet, polydipsia, polyuria, visual disturbances, vomiting and weight loss. Last A1C in the office was:  Lab Results  Component Value Date   HGBA1C 8.4 (H) 10/22/2018   Patient is on Vitamin D supplement.   Lab Results  Component Value Date   VD25OH 45 06/25/2018       Current Medications:  Current Outpatient Medications on File Prior to Visit  Medication Sig  . APPLE CIDER VINEGAR PO Take 1 Dose by mouth daily.  . Cinnamon 500 MG capsule Take 1,000 mg by mouth 2 (two) times daily.  Marland Kitchen lisinopril (ZESTRIL) 10 MG tablet Take 1 tablet Daily for BP  . metFORMIN (GLUCOPHAGE-XR) 500 MG 24 hr tablet Take 2 tablets 2 x /day with Meals for Diabetes  . Multiple Vitamins-Minerals (MULTIVITAMIN ADULT PO) Take by mouth.  . nitroGLYCERIN (NITROSTAT) 0.4 MG SL tablet Place 1 tablet (0.4 mg total) under the tongue every 5 (five) minutes as needed for chest pain.   No current facility-administered medications on file prior to visit.     Allergies:  No Known Allergies   Medical History:  Past Medical History:  Diagnosis Date  . Diabetes mellitus without complication ([place]) 0000000  . Fatty liver   . Hypertension 2012    Family history- Reviewed and unchanged   Social history- Reviewed and unchanged   Names of Other Physician/Practitioners you currently use: 1. [facility] here for primary care 2. Eye Exam: Over Due, Discussed with patient 3. Dental Exam: Due   Patient Care  Team: Unk Pinto, MD as PCP - General (Internal Medicine)   Screening Tests: Immunization History  Administered Date(s) Administered  . Pneumococcal Polysaccharide-23 07/11/2018  . Td 05/28/2013     Vaccinations: TD or Tdap: 2014  Influenza: Declines discussed with patient  Pneumococcal: 2019 Prevnar13: Due at 72 Shingles: Zostavax/Shingrix: at age 17   Preventative Care: Last colonoscopy: Due nextyear DEXA:N/A Hep C screening [id number]): N/A     Review of Systems:  Review of Systems  Constitutional: Negative for chills, diaphoresis, fever, malaise/fatigue and weight loss.  HENT: Negative for congestion, ear discharge, ear pain, hearing loss, nosebleeds, sinus pain, sore throat and tinnitus.   Eyes: Negative for blurred vision, double vision, photophobia, pain, discharge and redness.  Respiratory: Negative for cough, hemoptysis, sputum production, shortness of breath, wheezing and stridor.   Cardiovascular: Negative for chest pain, palpitations, orthopnea, claudication, leg swelling and PND.  Gastrointestinal: Negative for abdominal pain, blood in stool, constipation, diarrhea, heartburn, melena, nausea and vomiting.  Genitourinary: Negative for dysuria, flank pain, frequency, hematuria and urgency.  Musculoskeletal: Negative for back pain, falls, joint pain, myalgias and neck pain.  Skin: Negative  for itching and rash.  Neurological: Negative for dizziness, tingling, tremors, sensory change, speech change, focal weakness, seizures, loss of consciousness, weakness and headaches.  Endo/Heme/Allergies: Negative for environmental allergies and polydipsia. Does not bruise/bleed easily.  Psychiatric/Behavioral: Negative for depression, hallucinations, memory loss, substance abuse and suicidal ideas. The patient is not nervous/anxious and does not have insomnia.       Physical Exam: BP 122/82   Pulse 87   Temp 97.7 F (36.5 C)   Ht 6' 2.5" (1.892 m)   Wt 226 lb  12.8 oz (102.9 kg)   SpO2 95%   BMI 28.73 kg/m  Wt Readings from Last 3 Encounters:  07/30/19 226 lb 12.8 oz (102.9 kg)  11/19/18 230 lb (104.3 kg)  10/22/18 229 lb 12.8 oz (104.2 kg)   General Appearance: Well nourished, in no apparent distress. Eyes: PERRLA, EOMs, conjunctiva no swelling or erythema Sinuses: No Frontal/maxillary tenderness ENT/Mouth: Ext aud canals clear, TMs without erythema, bulging. No erythema, swelling, or exudate on post pharynx.  Tonsils not swollen or erythematous. Hearing normal.  Neck: Supple, thyroid normal.  Respiratory: Respiratory effort normal, BS equal bilaterally without rales, rhonchi, wheezing or stridor.  Cardio: RRR with no MRGs. Brisk peripheral pulses without edema.  Abdomen: Soft, + BS.  Non tender, no guarding, rebound, hernias, masses. Lymphatics: Non tender without lymphadenopathy.  Musculoskeletal: Full ROM, 5/5 strength, Normal gait Skin: Warm, dry without rashes, lesions, ecchymosis.  Neuro: Cranial nerves intact. No cerebellar symptoms.  Psych: Awake and oriented X 3, normal affect, Insight and Judgment appropriate.    Garnet Sierras, NP [facility] 9:29 AM

## 2019-07-30 ENCOUNTER — Encounter: Payer: Self-pay | Admitting: Adult Health Nurse Practitioner

## 2019-07-30 ENCOUNTER — Ambulatory Visit (INDEPENDENT_AMBULATORY_CARE_PROVIDER_SITE_OTHER): Payer: Managed Care, Other (non HMO) | Admitting: Adult Health Nurse Practitioner

## 2019-07-30 ENCOUNTER — Other Ambulatory Visit: Payer: Self-pay

## 2019-07-30 VITALS — BP 122/82 | HR 87 | Temp 97.7°F | Ht 74.5 in | Wt 226.8 lb

## 2019-07-30 DIAGNOSIS — Z0000 Encounter for general adult medical examination without abnormal findings: Secondary | ICD-10-CM | POA: Diagnosis not present

## 2019-07-30 DIAGNOSIS — Z13220 Encounter for screening for lipoid disorders: Secondary | ICD-10-CM | POA: Diagnosis not present

## 2019-07-30 DIAGNOSIS — Z6828 Body mass index (BMI) 28.0-28.9, adult: Secondary | ICD-10-CM

## 2019-07-30 DIAGNOSIS — Z1389 Encounter for screening for other disorder: Secondary | ICD-10-CM

## 2019-07-30 DIAGNOSIS — Z23 Encounter for immunization: Secondary | ICD-10-CM

## 2019-07-30 DIAGNOSIS — Z136 Encounter for screening for cardiovascular disorders: Secondary | ICD-10-CM

## 2019-07-30 DIAGNOSIS — E1122 Type 2 diabetes mellitus with diabetic chronic kidney disease: Secondary | ICD-10-CM

## 2019-07-30 DIAGNOSIS — N401 Enlarged prostate with lower urinary tract symptoms: Secondary | ICD-10-CM | POA: Diagnosis not present

## 2019-07-30 DIAGNOSIS — Z125 Encounter for screening for malignant neoplasm of prostate: Secondary | ICD-10-CM | POA: Diagnosis not present

## 2019-07-30 DIAGNOSIS — E538 Deficiency of other specified B group vitamins: Secondary | ICD-10-CM

## 2019-07-30 DIAGNOSIS — Z79899 Other long term (current) drug therapy: Secondary | ICD-10-CM | POA: Diagnosis not present

## 2019-07-30 DIAGNOSIS — G4726 Circadian rhythm sleep disorder, shift work type: Secondary | ICD-10-CM

## 2019-07-30 DIAGNOSIS — I10 Essential (primary) hypertension: Secondary | ICD-10-CM | POA: Diagnosis not present

## 2019-07-30 DIAGNOSIS — Z130 Encounter for screening for diseases of the blood and blood-forming organs and certain disorders involving the immune mechanism: Secondary | ICD-10-CM | POA: Diagnosis not present

## 2019-07-30 DIAGNOSIS — E559 Vitamin D deficiency, unspecified: Secondary | ICD-10-CM | POA: Diagnosis not present

## 2019-07-30 DIAGNOSIS — N402 Nodular prostate without lower urinary tract symptoms: Secondary | ICD-10-CM

## 2019-07-30 DIAGNOSIS — K760 Fatty (change of) liver, not elsewhere classified: Secondary | ICD-10-CM

## 2019-07-30 DIAGNOSIS — I700 Atherosclerosis of aorta: Secondary | ICD-10-CM

## 2019-07-30 DIAGNOSIS — R8290 Unspecified abnormal findings in urine: Secondary | ICD-10-CM

## 2019-07-30 DIAGNOSIS — Z131 Encounter for screening for diabetes mellitus: Secondary | ICD-10-CM

## 2019-07-30 DIAGNOSIS — E1169 Type 2 diabetes mellitus with other specified complication: Secondary | ICD-10-CM

## 2019-07-30 DIAGNOSIS — Z1329 Encounter for screening for other suspected endocrine disorder: Secondary | ICD-10-CM | POA: Diagnosis not present

## 2019-07-30 DIAGNOSIS — R350 Frequency of micturition: Secondary | ICD-10-CM | POA: Diagnosis not present

## 2019-07-30 NOTE — Patient Instructions (Addendum)
You may stop taking your lisinopril.  Check your blood pressure twice a day and write it down.  Goal is 130/80 or less.  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  We will follow up in one month.    Decrease soda in take 2-3 to 1-2  Eat more for breakfast, something with protein, eggs, frozen egg sandwich.  Try Protein shakes to see if it helps your diarrhea. You can consider adding a probiotic daily to help with this.    Try icing your hip and continuing the stretches once a day.  If it is too painful every other day  If this has not improved we can consider using prescriptive anti-inflammatories.     8 Critical Weight-Loss Tips That Aren't Diet and Exercise  1. STARVE THE DISTRACTIONS  All too often when we eat, we're also multitasking: watching TV, answering emails, scrolling through social media. These habits are detrimental to having a strong, clear, healthy relationship with food, and they can hinder our ability to make dietary changes.  In order to truly focus on what you're eating, how much you're eating, why you're eating those specific foods and, most importantly, how those foods make you feel, you need to starve the distractions. That means when you eat, just eat. Focus on your food, the process it went through to end up on your plate, where it came from and how it nourishes you. With this technique, you're more likely to finish a meal feeling satiated.  2.  CONSIDER WHAT YOU'RE NOT WILLING TO DO  This might sound counterintuitive, but it can help provide a "why" when motivation is waning. Declare, in writing, what you are unwilling to do, for example "I am unwilling to be the old dad who cannot play sports with my children".  So consider what you're not willing to accept, write it down, and keep it at the ready.  3.  STOP LABELING FOOD "GOOD" AND "BAD"  You've probably heard someone say they ate something  "bad." Maybe you've even said it yourself.  The trouble with 'bad' foods isn't that they'll send you to the grave after a bite or two. The trouble comes when we eat excessive portions of really calorie-dense foods meal after meal, day after day.  Instead of labeling foods as good or bad, think about which foods you can eat a lot of, and which ones you should just eat a little of. Then, plan ways to eat the foods you really like in portions that fit with your overall goals. A good example of this would be having a slice of pizza alongside a club salad with chicken breast, avocado and a bit of dressing. This is vastly different than 3 slices of pizza, 4 breadsticks with cheese sauce and half of a liter of regular soda.  4.  BRUSH YOUR TEETH AFTER YOU EAT  Getting your mindset in order is important, but sometimes small habits can make a big difference. After eating, you still have the taste of food in their mouth, which often causes people to eat more even if they are full or engage in a nibble or two of dessert.  Brushing your teeth will remove the taste of food from your mouth, and the clean, minty freshness will serve as a cue that mealtime is over.  5.  FOCUS ON CROWDING NOT CUTTING  The most common first step during 'dieting' is  to cut. We cut our portion sizes down, we cut out 'bad' foods, we cut out entire food groups. This act of cutting puts [place] and our minds into scarcity mode.  When something is off-limits, even if you're able to avoid it for a while, you could end up bingeing on it later because you've gone so long without it. So, instead of cutting, focus on crowding. If you crowd your plate and fill it up with more foods like veggies and protein, it simply allows less room for the other stuff. In other words, shift your focus away from what you can't eat, and celebrate the foods that will help you reach your goals.  6.  TAKE TRACKING A STEP FURTHER  Track what you eat, when you ate it,  how much you ate and how that food made you feel. Being completely honest with yourself and writing down every single thing that passes through your lips will help you start to notice that maybe you actually do snack, possibly take in more sugar than you thought, eat when you're bored rather than just hungry or maybe that you have a habit of snacking before bed while watching TV.  The difference from simply tracking your food intake is you're taking into account how food makes you feel, as well as what you're doing while you're eating. This is about becoming more mindful of what, when and why you eat.  7.  PRIORITIZE GOOD SLEEP  One of the strongest risk factors for being overweight is poor sleep. When you're feeling tired, you're more likely to choose unhealthy comfort foods and to skip your workout. Additionally, sleep deprivation may slow down your metabolism. [employer]! Therefore, sleeping 7-8 hours per night can help with weight loss without having to change your diet or increase your physical activity. And if you feel you snore and still wake up tired, talk with me about sleep apnea.  8.  SET ASIDE TIME TO DISCONNECT  Just get out there. Disconnect from the electronics and connect to the elements. Not only will this help reduce stress (a major factor in weight gain) by giving your mind a break from the constant stimulation we've all become so accustomed to, but it may also reprogram your brain to connect with yourself and what you're feeling.  Ways to cut 100 calories  1. Eat your eggs with hot sauce OR salsa instead of cheese.  Eggs are great for breakfast, but many people consider eggs and cheese to be BFFs. Instead of cheese-1 oz. of cheddar has 114 calories-top your eggs with hot sauce, which contains no calories and helps with satiety and metabolism. Salsa is also a great option!!  2. Top your toast, waffles or pancakes with mashed berries instead of jelly or syrup. Half a cup of  berries-fresh, frozen or thawed-has about 40 calories, compared with 2 tbsp. of maple syrup or jelly, which both have about 100 calories. The berries will also give you a good punch of fiber, which helps keep you full and satisfied and won't spike blood sugar quickly like the jelly or syrup. 3. Swap the non-fat latte for black coffee with a splash of half-and-half. Contrary to its name, that non-fat latte has 130 calories and a startling 19g of carbohydrates per 16 oz. serving. Replacing that 'light' drinkable dessert with a black coffee with a splash of half-and-half saves you more than 100 calories per 16 oz. serving. 4. Sprinkle salads with freeze-dried raspberries instead of dried cranberries. If you  want a sweet addition to your nutritious salad, stay away from dried cranberries. They have a whopping 130 calories per  cup and 30g carbohydrates. Instead, sprinkle freeze-dried raspberries guilt-free and save more than 100 calories per  cup serving, adding 3g of belly-filling fiber. 5. Go for mustard in place of mayo on your sandwich. Mustard can add really nice flavor to any sandwich, and there are tons of varieties, from spicy to honey. A serving of mayo is 95 calories, versus 10 calories in a serving of mustard. 6. Choose a DIY salad dressing instead of the store-bought kind. Mix Dijon or whole grain mustard with low-fat Kefir or red wine vinegar and garlic. 7. Use hummus as a spread instead of a dip. Use hummus as a spread on a high-fiber cracker or tortilla with a sandwich and save on calories without sacrificing taste. 8. Pick just one salad "accessory." Salad isn't automatically a calorie winner. It's easy to over-accessorize with toppings. Instead of topping your salad with nuts, avocado and cranberries (all three will clock in at 313 calories), just pick one. The next day, choose a different accessory, which will also keep your salad interesting. You don't wear all your jewelry every day,  right? 9. Ditch the white pasta in favor of spaghetti squash. One cup of cooked spaghetti squash has about 40 calories, compared with traditional spaghetti, which comes with more than 200. Spaghetti squash is also nutrient-dense. It's a good source of fiber and Vitamins A and C, and it can be eaten just like you would eat pasta-with a great tomato sauce and [place] meatballs or with pesto, tofu and spinach, for example. 10. Dress up your chili, soups and stews with non-fat [place] yogurt instead of sour cream. Just a 'dollop' of sour cream can set you back 115 calories and a whopping 12g of fat-seven of which are of the artery-clogging variety. Added bonus: [place] yogurt is packed with muscle-building protein, calcium and B Vitamins. 11. Mash cauliflower instead of mashed potatoes. One cup of traditional mashed potatoes-in all their creamy goodness-has more than 200 calories, compared to mashed cauliflower, which you can typically eat for less than 100 calories per 1 cup serving. Cauliflower is a great source of the antioxidant indole-3-carbinol (I3C), which may help reduce the risk of some cancers, like breast cancer. 12. Ditch the ice cream sundae in favor of a [place] yogurt parfait. Instead of a cup of ice cream or fro-yo for dessert, try 1 cup of nonfat Greek yogurt topped with fresh berries and a sprinkle of cacao nibs. Both toppings are packed with antioxidants, which can help reduce cellular inflammation and oxidative damage. And the comparison is a no-brainer: One cup of ice cream has about 275 calories; one cup of frozen yogurt has about 230; and a cup of Greek yogurt has just 130, plus twice the protein, so you're less likely to return to the freezer for a second helping. 13. Put olive oil in a spray container instead of using it directly from the bottle. Each tablespoon of olive oil is 120 calories and 15g of fat. Use a mister instead of pouring it straight into the pan or onto a salad. This allows  for portion control and will save you more than 100 calories. 14. When baking, substitute canned pumpkin for butter or oil. Canned pumpkin-not pumpkin pie mix-is loaded with Vitamin A, which is important for skin and eye health, as well as immunity. And the comparisons are pretty crazy:  cup of canned  pumpkin has about 40 calories, compared to butter or oil, which has more than 800 calories. Yes, 800 calories. Applesauce and mashed banana can also serve as good substitutions for butter or oil, usually in a 1:1 ratio. 15. Top casseroles with high-fiber cereal instead of breadcrumbs. Breadcrumbs are typically made with white bread, while breakfast cereals contain 5-9g of fiber per serving. Not only will you save more than 150 calories per  cup serving, the swap will also keep you more full and you'll get a metabolism boost from the added fiber. 16. Snack on pistachios instead of macadamia nuts. Believe it or not, you get the same amount of calories from 35 pistachios (100 calories) as you would from only five macadamia nuts. 17. Chow down on kale chips rather than potato chips. This is my favorite 'don't knock it 'till you try it' swap. Kale chips are so easy to make at home, and you can spice them up with a little grated parmesan or chili powder. Plus, they're a mere fraction of the calories of potato chips, but with the same crunch factor we crave so often. 18. Add seltzer and some fruit slices to your cocktail instead of soda or fruit juice. One cup of soda or fruit juice can pack on as much as 140 calories. Instead, use seltzer and fruit slices. The fruit provides valuable phytochemicals, such as flavonoids and anthocyanins, which help to combat cancer and stave off the aging process.   Diet soda leads to weight gain.  We recently discovered that the artifical sugar in the soda stops an enzyme in your stomach that is suppose to signal that your brain is full. So patients that drink a lot of diet  soda will never feel full and tend to over eat. So please cut back on diet soda and it can help with your weight loss.   A study found that two or more sodas a day increased the risk of hypertension, diabetes, stroke.   Another recent study found that higher consumption of soda and juice was associated with increased risk of cancer and breast cancer.   In animal studies artifical sweeteners affect the gut bacteria and may contribute to chronic disease this way.    Vit D  & Vit C 1,000 mg   are recommended to help protect  against the Covid-19 and other Corona viruses.    Also it's recommended  to take  Zinc 50 mg  to help  protect against the Covid-19   and best place to get  is also on [email or web address]  and don't pay more than 6-8 cents /pill !  =============================== Coronavirus (COVID-19) Are you at risk?  Are you at risk for the Coronavirus (COVID-19)?  To be considered HIGH RISK for Coronavirus (COVID-19), you have to meet the following criteria:  . Traveled to [place], [place], [place], [place] or [place]; or in the [place] to [place], [place], [place]  . or [place]; and have fever, cough, and shortness of breath within the last 2 weeks of travel OR . Been in close contact with a person diagnosed with COVID-19 within the last 2 weeks and have  . fever, cough,and shortness of breath .  . IF YOU DO NOT MEET THESE CRITERIA, YOU ARE CONSIDERED LOW RISK FOR COVID-19.  What to do if you are HIGH RISK for COVID-19?  Marland Kitchen If you are having a medical emergency, call 911. . Seek medical care right away. Before you go to a  doctor's office, urgent care or emergency department, .  call ahead and tell them about your recent travel, contact with someone diagnosed with COVID-19  .  and your symptoms.  . You should receive instructions from your physician's office regarding next steps of care.  . When you arrive at healthcare provider, tell the healthcare staff immediately  you have returned from  . visiting [place], [place], [place], [place] or [place]; or traveled in the [place] to [place], [place],  . [place] or [place] in the last two weeks or you have been in close contact with a person diagnosed with  . COVID-19 in the last 2 weeks.   . Tell the health care staff about your symptoms: fever, cough and shortness of breath. . After you have been seen by a medical provider, you will be either: o Tested for (COVID-19) and discharged home on quarantine except to seek medical care if  o symptoms worsen, and asked to  - Stay home and avoid contact with others until you get your results (4-5 days)  - Avoid travel on public transportation if possible (such as bus, train, or airplane) or o Sent to the Emergency Department by EMS for evaluation, COVID-19 testing  and  o possible admission depending on your condition and test results.  What to do if you are LOW RISK for COVID-19?  Reduce your risk of any infection by using the same precautions used for avoiding the common cold or flu:  Marland Kitchen Wash your hands often with soap and warm water for at least 20 seconds.  If soap and water are not readily available,  . use an alcohol-based hand sanitizer with at least 60% alcohol.  . If coughing or sneezing, cover your mouth and nose by coughing or sneezing into the elbow areas of your shirt or coat, .  into a tissue or into your sleeve (not your hands). . Avoid shaking hands with others and consider head nods or verbal greetings only. . Avoid touching your eyes, nose, or mouth with unwashed hands.  . Avoid close contact with people who are sick. . Avoid places or events with large numbers of people in one location, like concerts or sporting events. . Carefully consider travel plans you have or are making. . If you are planning any travel outside or inside the [place], visit the CDC's Travelers' Health webpage for the latest health notices. . If you have some symptoms but  not all symptoms, continue to monitor at home and seek medical attention  . if your symptoms worsen. . If you are having a medical emergency, call 911. >>>>>>>>>>>>>>>>>>>>>>>>>>>> Preventive Care for Adults  A healthy lifestyle and preventive care can promote health and wellness. Preventive health guidelines for men include the following key practices:  A routine yearly physical is a good way to check with your health care provider about your health and preventative screening. It is a chance to share any concerns and updates on your health and to receive a thorough exam.  Visit your dentist for a routine exam and preventative care every 6 months. Brush your teeth twice a day and floss once a day. Good oral hygiene prevents tooth decay and gum disease.  The frequency of eye exams is based on your age, health, family medical history, use of contact lenses, and other factors. Follow your health care provider's recommendations for frequency of eye exams.  Eat a healthy diet. Foods such as vegetables, fruits, whole grains, low-fat dairy  products, and lean protein foods contain the nutrients you need without too many calories. Decrease your intake of foods high in solid fats, added sugars, and salt. Eat the right amount of calories for you. Get information about a proper diet from your health care provider, if necessary.  Regular physical exercise is one of the most important things you can do for your health. Most adults should get at least 150 minutes of moderate-intensity exercise (any activity that increases your heart rate and causes you to sweat) each week. In addition, most adults need muscle-strengthening exercises on 2 or more days a week.  Maintain a healthy weight. The body mass index (BMI) is a screening tool to identify possible weight problems. It provides an estimate of body fat based on height and weight. Your health care provider can find your BMI and can help you achieve or maintain a  healthy weight. For adults 20 years and older:  A BMI below 18.5 is considered underweight.  A BMI of 18.5 to 24.9 is normal.  A BMI of 25 to 29.9 is considered overweight.  A BMI of 30 and above is considered obese.  Maintain normal blood lipids and cholesterol levels by exercising and minimizing your intake of saturated fat. Eat a balanced diet with plenty of fruit and vegetables. Blood tests for lipids and cholesterol should begin at age 47 and be repeated every 5 years. If your lipid or cholesterol levels are high, you are over 50, or you are at high risk for heart disease, you may need your cholesterol levels checked more frequently. Ongoing high lipid and cholesterol levels should be treated with medicines if diet and exercise are not working.  If you smoke, find out from your health care provider how to quit. If you do not use tobacco, do not start.  Lung cancer screening is recommended for adults aged 70-80 years who are at high risk for developing lung cancer because of a history of smoking. A yearly low-dose CT scan of the lungs is recommended for people who have at least a 30-pack-year history of smoking and are a current smoker or have quit within the past 15 years. A pack year of smoking is smoking an average of 1 pack of cigarettes a day for 1 year (for example: 1 pack a day for 30 years or 2 packs a day for 15 years). Yearly screening should continue until the smoker has stopped smoking for at least 15 years. Yearly screening should be stopped for people who develop a health problem that would prevent them from having lung cancer treatment.  If you choose to drink alcohol, do not have more than 2 drinks per day. One drink is considered to be 12 ounces (355 mL) of beer, 5 ounces (148 mL) of wine, or 1.5 ounces (44 mL) of liquor.  Avoid use of street drugs. Do not share needles with anyone. Ask for help if you need support or instructions about stopping the use of drugs.  High blood  pressure causes heart disease and increases the risk of stroke. Your blood pressure should be checked at least every 1-2 years. Ongoing high blood pressure should be treated with medicines, if weight loss and exercise are not effective.  If you are 54-65 years old, ask your health care provider if you should take aspirin to prevent heart disease.  Diabetes screening involves taking a blood sample to check your fasting blood sugar level. This should be done once every 3 years, after age  53, if you are within normal weight and without risk factors for diabetes. Testing should be considered at a younger age or be carried out more frequently if you are overweight and have at least 1 risk factor for diabetes.  Colorectal cancer can be detected and often prevented. Most routine colorectal cancer screening begins at the age of 12 and continues through age 10. However, your health care provider may recommend screening at an earlier age if you have risk factors for colon cancer. On a yearly basis, your health care provider may provide home test kits to check for hidden blood in the stool. Use of a small camera at the end of a tube to directly examine the colon (sigmoidoscopy or colonoscopy) can detect the earliest forms of colorectal cancer. Talk to your health care provider about this at age 22, when routine screening begins. Direct exam of the colon should be repeated every 5-10 years through age 86, unless early forms of precancerous polyps or small growths are found.   Talk with your health care provider about prostate cancer screening.  Testicular cancer screening isrecommended for adult males. Screening includes self-exam, a health care provider exam, and other screening tests. Consult with your health care provider about any symptoms you have or any concerns you have about testicular cancer.  Use sunscreen. Apply sunscreen liberally and repeatedly throughout the day. You should seek shade when your shadow  is shorter than you. Protect yourself by wearing long sleeves, pants, a wide-brimmed hat, and sunglasses year round, whenever you are outdoors.  Once a month, do a whole-body skin exam, using a mirror to look at the skin on your back. Tell your health care provider about new moles, moles that have irregular borders, moles that are larger than a pencil eraser, or moles that have changed in shape or color.  Stay current with required vaccines (immunizations).  Influenza vaccine. All adults should be immunized every year.  Tetanus, diphtheria, and acellular pertussis (Td, Tdap) vaccine. An adult who has not previously received Tdap or who does not know his vaccine status should receive 1 dose of Tdap. This initial dose should be followed by tetanus and diphtheria toxoids (Td) booster doses every 10 years. Adults with an unknown or incomplete history of completing a 3-dose immunization series with Td-containing vaccines should begin or complete a primary immunization series including a Tdap dose. Adults should receive a Td booster every 10 years.  Varicella vaccine. An adult without evidence of immunity to varicella should receive 2 doses or a second dose if he has previously received 1 dose.  Human papillomavirus (HPV) vaccine. Males aged 30-21 years who have not received the vaccine previously should receive the 3-dose series. Males aged 22-26 years may be immunized. Immunization is recommended through the age of 27 years for any male who has sex with males and did not get any or all doses earlier. Immunization is recommended for any person with an immunocompromised condition through the age of 35 years if he did not get any or all doses earlier. During the 3-dose series, the second dose should be obtained 4-8 weeks after the first dose. The third dose should be obtained 24 weeks after the first dose and 16 weeks after the second dose.  Zoster vaccine. One dose is recommended for adults aged 63 years or  older unless certain conditions are present.    PREVNAR  - Pneumococcal 13-valent conjugate (PCV13) vaccine. When indicated, a person who is uncertain of his immunization history  and has no record of immunization should receive the PCV13 vaccine. An adult aged 10 years or older who has certain medical conditions and has not been previously immunized should receive 1 dose of PCV13 vaccine. This PCV13 should be followed with a dose of pneumococcal polysaccharide (PPSV23) vaccine. The PPSV23 vaccine dose should be obtained at least 1 r more year(s) after the dose of PCV13 vaccine. An adult aged 26 years or older who has certain medical conditions and previously received 1 or more doses of PPSV23 vaccine should receive 1 dose of PCV13. The PCV13 vaccine dose should be obtained 1 or more years after the last PPSV23 vaccine dose.    PNEUMOVAX - Pneumococcal polysaccharide (PPSV23) vaccine. When PCV13 is also indicated, PCV13 should be obtained first. All adults aged 37 years and older should be immunized. An adult younger than age 93 years who has certain medical conditions should be immunized. Any person who resides in a nursing home or long-term care facility should be immunized. An adult smoker should be immunized. People with an immunocompromised condition and certain other conditions should receive both PCV13 and PPSV23 vaccines. People with human immunodeficiency virus (HIV) infection should be immunized as soon as possible after diagnosis. Immunization during chemotherapy or radiation therapy should be avoided. Routine use of PPSV23 vaccine is not recommended for American Indians, [place] Natives, or people younger than 65 years unless there are medical conditions that require PPSV23 vaccine. When indicated, people who have unknown immunization and have no record of immunization should receive PPSV23 vaccine. One-time revaccination 5 years after the first dose of PPSV23 is recommended for people aged 19-64  years who have chronic kidney failure, nephrotic syndrome, asplenia, or immunocompromised conditions. People who received 1-2 doses of PPSV23 before age 21 years should receive another dose of PPSV23 vaccine at age 75 years or later if at least 5 years have passed since the previous dose. Doses of PPSV23 are not needed for people immunized with PPSV23 at or after age 66 years.    Hepatitis A vaccine. Adults who wish to be protected from this disease, have certain high-risk conditions, work with hepatitis A-infected animals, work in hepatitis A research labs, or travel to or work in countries with a high rate of hepatitis A should be immunized. Adults who were previously unvaccinated and who anticipate close contact with an international adoptee during the first 60 days after arrival in the [place] from a country with a high rate of hepatitis A should be immunized.    Hepatitis B vaccine. Adults should be immunized if they wish to be protected from this disease, have certain high-risk conditions, may be exposed to blood or other infectious body fluids, are household contacts or sex partners of hepatitis B positive people, are clients or workers in certain care facilities, or travel to or work in countries with a high rate of hepatitis B.   Preventive Service / Frequency   Ages 59 to 72  Blood pressure check.  Lipid and cholesterol check  Lung cancer screening. / Every year if you are aged 33-80 years and have a 30-pack-year history of smoking and currently smoke or have quit within the past 15 years. Yearly screening is stopped once you have quit smoking for at least 15 years or develop a health problem that would prevent you from having lung cancer treatment.  Fecal occult blood test (FOBT) of stool. / Every year beginning at age 70 and continuing until age 64. You may not have  to do this test if you get a colonoscopy every 10 years.  Flexible sigmoidoscopy** or colonoscopy.** / Every 5  years for a flexible sigmoidoscopy or every 10 years for a colonoscopy beginning at age 72 and continuing until age 46. Screening for abdominal aortic aneurysm (AAA)  by ultrasound is recommended for people who have history of high blood pressure or who are current or former smokers. +++++++++++ Recommend Adult Low Dose Aspirin or  coated  Aspirin 81 mg daily  To reduce risk of Colon Cancer 40 %,  Skin Cancer 26 % ,  Malignant Melanoma 46%  and  Pancreatic cancer 60% ++++++++++++++++++++ Vitamin D goal  is between 70-100.  Please make sure that you are taking your Vitamin D as directed.  It is very important as a natural anti-inflammatory  helping hair, skin, and nails, as well as reducing stroke and heart attack risk.  It helps your bones and helps with mood. It also decreases numerous cancer risks so please take it as directed.  Low Vit D is associated with a 200-300% higher risk for CANCER  and 200-300% higher risk for HEART   ATTACK  &  STROKE.   .....................................Marland Kitchen It is also associated with higher death rate at younger ages,  autoimmune diseases like Rheumatoid arthritis, Lupus, Multiple Sclerosis.    Also many other serious conditions, like depression, Alzheimer's Dementia, infertility, muscle aches, fatigue, fibromyalgia - just to name a few. +++++++++++++++++++++ Recommend the book "The END of DIETING" by Dr Excell Seltzer  & the book "The END of DIABETES " by Dr Excell Seltzer At [email or web address] - get book & Audio CD's    Being diabetic has a  300% increased risk for heart attack, stroke, cancer, and alzheimer- type vascular dementia. It is very important that you work harder with diet by avoiding all foods that are white. Avoid white rice (brown & wild rice is OK), white potatoes (sweetpotatoes in moderation is OK), White bread or wheat bread or anything made out of white flour like bagels, donuts, rolls, buns, biscuits, cakes, pastries, cookies, pizza crust, and  pasta (made from white flour & egg whites) - vegetarian pasta or spinach or wheat pasta is OK. Multigrain breads like Arnold's or Pepperidge Farm, or multigrain sandwich thins or flatbreads.  Diet, exercise and weight loss can reverse and cure diabetes in the early stages.  Diet, exercise and weight loss is very important in the control and prevention of complications of diabetes which affects every system in your body, ie. Brain - dementia/stroke, eyes - glaucoma/blindness, heart - heart attack/heart failure, kidneys - dialysis, stomach - gastric paralysis, intestines - malabsorption, nerves - severe painful neuritis, circulation - gangrene & loss of a leg(s), and finally cancer and Alzheimers.    I recommend avoid fried & greasy foods,  sweets/candy, white rice (brown or wild rice or Quinoa is OK), white potatoes (sweet potatoes are OK) - anything made from white flour - bagels, doughnuts, rolls, buns, biscuits,white and wheat breads, pizza crust and traditional pasta made of white flour & egg white(vegetarian pasta or spinach or wheat pasta is OK).  Multi-grain bread is OK - like multi-grain flat bread or sandwich thins. Avoid alcohol in excess. Exercise is also important.    Eat all the vegetables you want - avoid meat, especially red meat and dairy - especially cheese.  Cheese is the most concentrated form of trans-fats which is the worst thing to clog up our arteries. Veggie cheese is OK which can be  found in the fresh produce section at [facility] or [employer] or [employer]  ++++++++++++++++++++++ DASH Eating Plan  DASH stands for "Dietary Approaches to Stop Hypertension."   The DASH eating plan is a healthy eating plan that has been shown to reduce high blood pressure (hypertension). Additional health benefits may include reducing the risk of type 2 diabetes mellitus, heart disease, and stroke. The DASH eating plan may also help with weight loss. WHAT DO I NEED TO KNOW ABOUT THE DASH EATING  PLAN? For the DASH eating plan, you will follow these general guidelines:  Choose foods with a percent daily value for sodium of less than 5% (as listed on the food label).  Use salt-free seasonings or herbs instead of table salt or sea salt.  Check with your health care provider or pharmacist before using salt substitutes.  Eat lower-sodium products, often labeled as "lower sodium" or "no salt added."  Eat fresh foods.  Eat more vegetables, fruits, and low-fat dairy products.  Choose whole grains. Look for the word "whole" as the first word in the ingredient list.  Choose fish   Limit sweets, desserts, sugars, and sugary drinks.  Choose heart-healthy fats.  Eat veggie cheese   Eat more home-cooked food and less restaurant, buffet, and fast food.  Limit fried foods.  Cook foods using methods other than frying.  Limit canned vegetables. If you do use them, rinse them well to decrease the sodium.  When eating at a restaurant, ask that your food be prepared with less salt, or no salt if possible.                      WHAT FOODS CAN I EAT? Read Dr Fara Olden Fuhrman's books on The End of Dieting & The End of Diabetes  Grains Whole grain or whole wheat bread. Brown rice. Whole grain or whole wheat pasta. Quinoa, bulgur, and whole grain cereals. Low-sodium cereals. Corn or whole wheat flour tortillas. Whole grain cornbread. Whole grain crackers. Low-sodium crackers.  Vegetables Fresh or frozen vegetables (raw, steamed, roasted, or grilled). Low-sodium or reduced-sodium tomato and vegetable juices. Low-sodium or reduced-sodium tomato sauce and paste. Low-sodium or reduced-sodium canned vegetables.   Fruits All fresh, canned (in natural juice), or frozen fruits.  Protein Products  All fish and seafood.  Dried beans, peas, or lentils. Unsalted nuts and seeds. Unsalted canned beans.  Dairy Low-fat dairy products, such as skim or 1% milk, 2% or reduced-fat cheeses, low-fat ricotta  or cottage cheese, or plain low-fat yogurt. Low-sodium or reduced-sodium cheeses.  Fats and Oils Tub margarines without trans fats. Light or reduced-fat mayonnaise and salad dressings (reduced sodium). Avocado. Safflower, olive, or canola oils. Natural peanut or almond butter.  Other Unsalted popcorn and pretzels. The items listed above may not be a complete list of recommended foods or beverages. Contact your dietitian for more options.  +++++++++++++++++++  WHAT FOODS ARE NOT RECOMMENDED? Grains/ White flour or wheat flour White bread. White pasta. White rice. Refined cornbread. Bagels and croissants. Crackers that contain trans fat.  Vegetables  Creamed or fried vegetables. Vegetables in a . Regular canned vegetables. Regular canned tomato sauce and paste. Regular tomato and vegetable juices.  Fruits Dried fruits. Canned fruit in light or heavy syrup. Fruit juice.  Meat and Other Protein Products Meat in general - RED meat & White meat.  Fatty cuts of meat. Ribs, chicken wings, all processed meats as bacon, sausage, bologna, salami, fatback, hot dogs, bratwurst and packaged  luncheon meats.  Dairy Whole or 2% milk, cream, half-and-half, and cream cheese. Whole-fat or sweetened yogurt. Full-fat cheeses or blue cheese. Non-dairy creamers and whipped toppings. Processed cheese, cheese spreads, or cheese curds.  Condiments Onion and garlic salt, seasoned salt, table salt, and sea salt. Canned and packaged gravies. Worcestershire sauce. Tartar sauce. Barbecue sauce. Teriyaki sauce. Soy sauce, including reduced sodium. Steak sauce. Fish sauce. Oyster sauce. Cocktail sauce. Horseradish. Ketchup and mustard. Meat flavorings and tenderizers. Bouillon cubes. Hot sauce. Tabasco sauce. Marinades. Taco seasonings. Relishes.  Fats and Oils Butter, stick margarine, lard, shortening and bacon fat. Coconut, palm kernel, or palm oils. Regular salad dressings.  Pickles and olives. Salted popcorn and  pretzels.  The items listed above may not be a complete list of foods and beverages to avoid.

## 2019-07-31 ENCOUNTER — Other Ambulatory Visit: Payer: Self-pay | Admitting: Adult Health Nurse Practitioner

## 2019-07-31 DIAGNOSIS — E559 Vitamin D deficiency, unspecified: Secondary | ICD-10-CM

## 2019-07-31 LAB — CBC WITH DIFFERENTIAL/PLATELET
Absolute Monocytes: 724 cells/uL (ref 200–950)
Basophils Absolute: 82 cells/uL (ref 0–200)
Basophils Relative: 0.8 %
Eosinophils Absolute: 255 cells/uL (ref 15–500)
Eosinophils Relative: 2.5 %
HCT: 50.9 % — ABNORMAL HIGH (ref 38.5–50.0)
Hemoglobin: 16.9 g/dL (ref 13.2–17.1)
Lymphs Abs: 3682 cells/uL (ref 850–3900)
MCH: 29.3 pg (ref 27.0–33.0)
MCHC: 33.2 g/dL (ref 32.0–36.0)
MCV: 88.4 fL (ref 80.0–100.0)
MPV: 11.4 fL (ref 7.5–12.5)
Monocytes Relative: 7.1 %
Neutro Abs: 5457 cells/uL (ref 1500–7800)
Neutrophils Relative %: 53.5 %
Platelets: 247 10*3/uL (ref 140–400)
RBC: 5.76 10*6/uL (ref 4.20–5.80)
RDW: 12.6 % (ref 11.0–15.0)
Total Lymphocyte: 36.1 %
WBC: 10.2 10*3/uL (ref 3.8–10.8)

## 2019-07-31 LAB — LIPID PANEL
Cholesterol: 181 mg/dL (ref <200)
HDL: 33 mg/dL — ABNORMAL LOW (ref >=40)
LDL Cholesterol (Calc): 114 mg/dL (calc) — ABNORMAL HIGH
Non-HDL Cholesterol (Calc): 148 mg/dL (calc) — ABNORMAL HIGH (ref <130)
Total CHOL/HDL Ratio: 5.5 (calc) — ABNORMAL HIGH (ref <5.0)
Triglycerides: 216 mg/dL — ABNORMAL HIGH (ref <150)

## 2019-07-31 LAB — COMPLETE METABOLIC PANEL WITH GFR
AG Ratio: 1.7 (calc) (ref 1.0–2.5)
ALT: 141 U/L — ABNORMAL HIGH (ref 9–46)
AST: 90 U/L — ABNORMAL HIGH (ref 10–40)
Albumin: 4.5 g/dL (ref 3.6–5.1)
Alkaline phosphatase (APISO): 57 U/L (ref 36–130)
BUN: 16 mg/dL (ref 7–25)
CO2: 25 mmol/L (ref 20–32)
Calcium: 9.8 mg/dL (ref 8.6–10.3)
Chloride: 103 mmol/L (ref 98–110)
Creat: 0.96 mg/dL (ref 0.60–1.35)
GFR, Est African American: 107 mL/min/{1.73_m2} (ref >=60)
GFR, Est Non African American: 92 mL/min/{1.73_m2} (ref >=60)
Globulin: 2.7 g/dL (calc) (ref 1.9–3.7)
Glucose, Bld: 125 mg/dL — ABNORMAL HIGH (ref 65–99)
Potassium: 4.4 mmol/L (ref 3.5–5.3)
Sodium: 139 mmol/L (ref 135–146)
Total Bilirubin: 1.2 mg/dL (ref 0.2–1.2)
Total Protein: 7.2 g/dL (ref 6.1–8.1)

## 2019-07-31 LAB — MICROALBUMIN / CREATININE URINE RATIO
Creatinine, Urine: 140 mg/dL (ref 20–320)
Microalb Creat Ratio: 6 mcg/mg creat (ref <30)
Microalb, Ur: 0.9 mg/dL

## 2019-07-31 LAB — TSH: TSH: 1.83 mIU/L (ref 0.40–4.50)

## 2019-07-31 LAB — URINALYSIS W MICROSCOPIC + REFLEX CULTURE
Bacteria, UA: NONE SEEN /HPF
Bilirubin Urine: NEGATIVE
Glucose, UA: NEGATIVE
Hgb urine dipstick: NEGATIVE
Hyaline Cast: NONE SEEN /LPF
Ketones, ur: NEGATIVE
Leukocyte Esterase: NEGATIVE
Nitrites, Initial: NEGATIVE
Protein, ur: NEGATIVE
RBC / HPF: NONE SEEN /HPF (ref 0–2)
Specific Gravity, Urine: 1.024 (ref 1.001–1.03)
Squamous Epithelial / HPF: NONE SEEN /HPF (ref <=5)
WBC, UA: NONE SEEN /HPF (ref 0–5)
pH: <=5 (ref 5.0–8.0)

## 2019-07-31 LAB — VITAMIN D 25 HYDROXY (VIT D DEFICIENCY, FRACTURES): Vit D, 25-Hydroxy: 37 ng/mL (ref 30–100)

## 2019-07-31 LAB — HEMOGLOBIN A1C
Hgb A1c MFr Bld: 7.8 % of total Hgb — ABNORMAL HIGH (ref <5.7)
Mean Plasma Glucose: 177 (calc)
eAG (mmol/L): 9.8 (calc)

## 2019-07-31 LAB — MAGNESIUM: Magnesium: 1.9 mg/dL (ref 1.5–2.5)

## 2019-07-31 LAB — INSULIN, RANDOM: Insulin: 9.1 u[IU]/mL

## 2019-07-31 LAB — NO CULTURE INDICATED

## 2019-07-31 LAB — VITAMIN B12: Vitamin B-12: 741 pg/mL (ref 200–1100)

## 2019-07-31 MED ORDER — CHOLECALCIFEROL 1.25 MG (50000 UT) PO CAPS: ORAL_CAPSULE | ORAL | 0 refills | Status: DC

## 2019-08-19 ENCOUNTER — Other Ambulatory Visit: Payer: Self-pay

## 2019-08-19 DIAGNOSIS — Z20822 Contact with and (suspected) exposure to covid-19: Secondary | ICD-10-CM

## 2019-08-20 LAB — NOVEL CORONAVIRUS, NAA: SARS-CoV-2, NAA: NOT DETECTED

## 2019-09-03 ENCOUNTER — Ambulatory Visit: Payer: Managed Care, Other (non HMO) | Admitting: Adult Health Nurse Practitioner

## 2019-10-10 ENCOUNTER — Ambulatory Visit: Payer: Managed Care, Other (non HMO) | Admitting: Adult Health Nurse Practitioner

## 2019-11-10 ENCOUNTER — Other Ambulatory Visit: Payer: Self-pay | Admitting: Adult Health Nurse Practitioner

## 2019-11-10 DIAGNOSIS — E559 Vitamin D deficiency, unspecified: Secondary | ICD-10-CM

## 2020-01-15 ENCOUNTER — Encounter: Payer: Self-pay | Admitting: Adult Health

## 2020-01-15 ENCOUNTER — Ambulatory Visit (INDEPENDENT_AMBULATORY_CARE_PROVIDER_SITE_OTHER): Payer: Managed Care, Other (non HMO) | Admitting: Adult Health

## 2020-01-15 ENCOUNTER — Other Ambulatory Visit: Payer: Self-pay

## 2020-01-15 VITALS — BP 118/72 | HR 90 | Temp 97.7°F | Wt 224.0 lb

## 2020-01-15 DIAGNOSIS — E785 Hyperlipidemia, unspecified: Secondary | ICD-10-CM | POA: Diagnosis not present

## 2020-01-15 DIAGNOSIS — E1122 Type 2 diabetes mellitus with diabetic chronic kidney disease: Secondary | ICD-10-CM | POA: Diagnosis not present

## 2020-01-15 DIAGNOSIS — Z6828 Body mass index (BMI) 28.0-28.9, adult: Secondary | ICD-10-CM

## 2020-01-15 DIAGNOSIS — E559 Vitamin D deficiency, unspecified: Secondary | ICD-10-CM

## 2020-01-15 DIAGNOSIS — E1169 Type 2 diabetes mellitus with other specified complication: Secondary | ICD-10-CM | POA: Diagnosis not present

## 2020-01-15 DIAGNOSIS — N182 Chronic kidney disease, stage 2 (mild): Secondary | ICD-10-CM

## 2020-01-15 DIAGNOSIS — I10 Essential (primary) hypertension: Secondary | ICD-10-CM | POA: Diagnosis not present

## 2020-01-15 DIAGNOSIS — J029 Acute pharyngitis, unspecified: Secondary | ICD-10-CM | POA: Diagnosis not present

## 2020-01-15 DIAGNOSIS — Z79899 Other long term (current) drug therapy: Secondary | ICD-10-CM | POA: Diagnosis not present

## 2020-01-15 DIAGNOSIS — J069 Acute upper respiratory infection, unspecified: Secondary | ICD-10-CM | POA: Diagnosis not present

## 2020-01-15 DIAGNOSIS — K760 Fatty (change of) liver, not elsewhere classified: Secondary | ICD-10-CM | POA: Diagnosis not present

## 2020-01-15 LAB — POCT RAPID STREP A (OFFICE): Rapid Strep A Screen: NEGATIVE

## 2020-01-15 MED ORDER — PREDNISONE 20 MG PO TABS
ORAL_TABLET | ORAL | 0 refills | Status: DC
Start: 2020-01-15 — End: 2020-04-15

## 2020-01-15 MED ORDER — FEXOFENADINE HCL 180 MG PO TABS: 180.0000 mg | ORAL_TABLET | Freq: Every day | ORAL | 1 refills | Status: DC

## 2020-01-15 NOTE — Progress Notes (Signed)
FOLLOW UP  Assessment and Plan:   Hypertension Well controlled with current medications  Monitor blood pressure at home; patient to call if consistently greater than 130/80 Continue DASH diet.   Reminder to go to the ER if any CP, SOB, nausea, dizziness, severe HA, changes vision/speech, left arm numbness and tingling and jaw pain.  Cholesterol Currently above goal of LDL <70; initiate low dose statin if remains above goal on labs today  Continue low cholesterol diet and exercise.  Check lipid panel.   Diabetes with diabetic chronic kidney disease Continue medication: metformin  Continue diet and exercise.  Perform daily foot/skin check, notify office of any concerning changes.  Check A1C  BMI 28 Long discussion about weight loss, diet, and exercise Recommended diet heavy in fruits and veggies and low in animal meats, cheeses, and dairy products, appropriate calorie intake Discussed ideal weight for height  Continue current efforts with lifestyle modification Will follow up in 3 months  Vitamin D Def At goal at last visit; continue supplementation to maintain goal of 70-100 Defer Vit D level  Acute nasopharyngitis Strep negative Exam is benign, suspect viral vs allergies Recommended covid 19 testing to rule out Discussed the importance of avoiding unnecessary antibiotic therapy. Suggested symptomatic OTC remedies. Nasal saline spray for congestion, stop BC sinus, better to do plain sudafed Nasal steroids, allergy pill, oral steroids offered increase fluids,  Salt water gargles, voice rest, sugar free lozenges Follow up as needed for symptoms lasting longer than 10 days, or with progression If symptoms do not improve in 5-7 days or get worse contact the office   Continue diet and meds as discussed. Further disposition pending results of labs. Discussed med's effects and SE's.   Over 30 minutes of exam, counseling, chart review, and critical decision making was  performed.   Future Appointments  Date Time Provider [place]  08/04/2020  3:00 PM McClanahan, Danton Sewer, NP [id number] None    ----------------------------------------------------------------------------------------------------------------------  HPI 49 y.o. male  presents for overdue 3 month follow up on hypertension, cholesterol, diabetes, weight and vitamin D deficiency and acute 3 days URI.   He has hx of allergies, typically has annual sinus infections.  Worked in yard Sunday. He reports woke up Monday feeling unwell, nasal congestion/runny nose, sore throat, sinus pressure, muscle aches, cramps. Denies fever/chills, headache. He reports persistent sx, today seems worse. Starting to have ear pressure, hurts to swallow. Trouble sleeping, woke up every hour, but not sure why. Felt bad enough this AM that almost called out of work. Denies cough or shortness of breath, wheezing. Can't smell but attributes this to nasal congestion.   He reports has tried ibuprofen, BC sinus/cold, some improvement with nasal. No known sick contacts, hasn't had covid 19 vaccine. Works for [employer].   BMI is Body mass index is 28.38 kg/m., he has been working on diet and exercise. Wt Readings from Last 3 Encounters:  01/15/20 224 lb (101.6 kg)  07/30/19 226 lb 12.8 oz (102.9 kg)  11/19/18 230 lb (104.3 kg)   His blood pressure has been controlled at home, today their BP is BP: 118/72  He does not workout but work physically active job. He denies chest pain, shortness of breath, dizziness.   He is not on cholesterol medication and denies myalgias. His cholesterol is at goal. The cholesterol last visit was:   Lab Results  Component Value Date   CHOL 181 07/30/2019   HDL 33 (L) 07/30/2019   LDLCALC 114 (H) 07/30/2019  TRIG 216 (H) 07/30/2019   CHOLHDL 5.5 (H) 07/30/2019    He has been working on diet and exercise for T2 diabetes on metformin (2000 mg), and denies foot ulcerations, increased appetite,  nausea, paresthesia of the feet, polydipsia, polyuria, visual disturbances, vomiting and weight loss. He admits hasn't been checking fasting glucose consistently, when checks has ranged 150-250.  Last A1C in the office was:  Lab Results  Component Value Date   HGBA1C 7.8 (H) 07/30/2019   Lab Results  Component Value Date   GFRNONAA 92 07/30/2019   Patient is on Vitamin D supplement and at goal at recent check:    Lab Results  Component Value Date   VD25OH 37 07/30/2019        Current Medications:  Current Outpatient Medications on File Prior to Visit  Medication Sig  . APPLE CIDER VINEGAR PO Take 1 Dose by mouth daily.  . Cholecalciferol (D3-50) 1.25 MG (50000 UT) capsule Take 1 tablet 3 x /week on Mon Wed & Fri for Severe Vitamin D Deficiency  . Cinnamon 500 MG capsule Take 1,000 mg by mouth 2 (two) times daily.  . Cyanocobalamin (B-12 PO) Take by mouth.  Marland Kitchen lisinopril (ZESTRIL) 10 MG tablet Take 1 tablet Daily for BP  . metFORMIN (GLUCOPHAGE-XR) 500 MG 24 hr tablet Take 2 tablets 2 x /day with Meals for Diabetes  . nitroGLYCERIN (NITROSTAT) 0.4 MG SL tablet Place 1 tablet (0.4 mg total) under the tongue every 5 (five) minutes as needed for chest pain.  . Omega-3 Fatty Acids (FISH OIL PO) Take by mouth.  . zinc gluconate 50 MG tablet Take 50 mg by mouth daily.   No current facility-administered medications on file prior to visit.     Allergies: No Known Allergies   Medical History:  Past Medical History:  Diagnosis Date  . Diabetes mellitus without complication ([place]) 0000000  . Fatty liver   . Hypertension 2012   Family history- Reviewed and unchanged Social history- Reviewed and unchanged   Review of Systems:  Review of Systems  Constitutional: Negative for malaise/fatigue and weight loss.  HENT: Positive for congestion, ear pain (bil pressure) and sore throat. Negative for ear discharge, hearing loss, nosebleeds, sinus pain and tinnitus.   Eyes: Negative for blurred  vision and double vision.  Respiratory: Negative for cough, sputum production, shortness of breath and wheezing.   Cardiovascular: Negative for chest pain, palpitations, orthopnea, claudication and leg swelling.  Gastrointestinal: Negative for abdominal pain, blood in stool, constipation, diarrhea, heartburn, melena, nausea and vomiting.  Genitourinary: Negative.   Musculoskeletal: Positive for myalgias. Negative for joint pain.  Skin: Negative for rash.  Neurological: Negative for dizziness, tingling, sensory change, weakness and headaches.  Endo/Heme/Allergies: Positive for environmental allergies. Negative for polydipsia.  Psychiatric/Behavioral: Negative.   All other systems reviewed and are negative.   Physical Exam: BP 118/72   Pulse 90   Temp 97.7 F (36.5 C)   Wt 224 lb (101.6 kg)   SpO2 97%   BMI 28.38 kg/m  Wt Readings from Last 3 Encounters:  01/15/20 224 lb (101.6 kg)  07/30/19 226 lb 12.8 oz (102.9 kg)  11/19/18 230 lb (104.3 kg)   General Appearance: Well nourished, appears to be feeling unwell but in no apparent distress. Eyes: PERRLA, conjunctiva no swelling or erythema Sinuses: No Frontal/maxillary tenderness ENT/Mouth: Ext aud canals clear, TMs without erythema, bulging. Post pharynx with mild erythema, without swelling, or exudate on post pharynx.  Tonsils not swollen or erythematous.  Hearing normal.  Neck: Supple Respiratory: Respiratory effort normal, BS equal bilaterally without rales, rhonchi, wheezing or stridor.  Cardio: RRR with no MRGs. Brisk peripheral pulses without edema.  Abdomen: Soft, + BS.  Non tender. Lymphatics: Non tender without lymphadenopathy.  Musculoskeletal: Full ROM, 5/5 strength, Normal gait Skin: Warm, dry, no lesions, ecchymosis, rash.  Neuro: Cranial nerves intact. No cerebellar symptoms.  Psych: Awake and oriented X 3, normal affect, Insight and Judgment appropriate.    Izora Ribas, NP 3:20 PM [facility]

## 2020-01-15 NOTE — Addendum Note (Signed)
Addended by: Elsie Amis D on: 01/15/2020 04:56 PM   Modules accepted: Orders

## 2020-01-15 NOTE — Patient Instructions (Addendum)
Goals    . HEMOGLOBIN A1C < 5.7    . LDL CALC < 70    . Weight (lb) < 200 lb (90.7 kg)        Start prednisone  Get covid 19 testing -   Symptoms don't strongly suggest bacterial infection   Stop BC sinus, pick up sudafed instead which is probably what is helping the most  Start allegra x 2 weeks or until feeling better  Follow up if new progression - very painful sinus, shortness of breath, chest congestion, fever/chills, etc   HOW TO TREAT VIRAL COUGH AND COLD SYMPTOMS:  -Symptoms usually last at least 1 week with the worst symptoms being around day 4.  - colds usually start with a sore throat and end with a cough, and the cough can take 2 weeks to get better.  -No antibiotics are needed for colds, flu, sore throats, cough, bronchitis UNLESS symptoms are longer than 7 days OR if you are getting better then get drastically worse.  -There are a lot of combination medications (Dayquil, Nyquil, Vicks 44, tyelnol cold and sinus, ETC). Please look at the ingredients on the back so that you are treating the correct symptoms and not doubling up on medications/ingredients.    Medicines you can use  Nasal congestion  Little Remedies saline spray (aerosol/mist)- can try this, it is in the kids section - pseudoephedrine (Sudafed)- behind the counter, do not use if you have high blood pressure, medicine that have -D in them.  - phenylephrine (Sudafed PE) -Dextormethorphan + chlorpheniramine (Coridcidin HBP)- okay if you have high blood pressure -Oxymetazoline (Afrin) nasal spray- LIMIT to 3 days -Saline nasal spray -Neti pot (used distilled or bottled water)  Ear pain/congestion  -pseudoephedrine (sudafed) - Nasonex/flonase nasal spray  Fever  -Acetaminophen (Tyelnol) -Ibuprofen (Advil, motrin, aleve)  Sore Throat  -Acetaminophen (Tyelnol) -Ibuprofen (Advil, motrin, aleve) -Drink a lot of water -Gargle with salt water - Rest your voice (don't talk) -Throat sprays -Cough  drops  Body Aches  -Acetaminophen (Tyelnol) -Ibuprofen (Advil, motrin, aleve)  Headache  -Acetaminophen (Tyelnol) -Ibuprofen (Advil, motrin, aleve) - Exedrin, Exedrin Migraine  Allergy symptoms (cough, sneeze, runny nose, itchy eyes) -Claritin or loratadine cheapest but likely the weakest  -Zyrtec or certizine at night because it can make you sleepy -The strongest is allegra or fexafinadine  Cheapest at [employer], [employer], [employer]  Cough  -Dextromethorphan (Delsym)- medicine that has DM in it -Guafenesin (Mucinex/Robitussin) - cough drops - drink lots of water  Chest Congestion  -Guafenesin (Mucinex/Robitussin)  Red Itchy Eyes  - Naphcon-A  Upset Stomach  - Bland diet (nothing spicy, greasy, fried, and high acid foods like tomatoes, oranges, berries) -OKAY- cereal, bread, soup, crackers, rice -Eat smaller more frequent meals -reduce caffeine, no alcohol -Loperamide (Imodium-AD) if diarrhea -Prevacid for heart burn  General health when sick  -Hydration -wash your hands frequently -keep surfaces clean -change pillow cases and sheets often -Get fresh air but do not exercise strenuously -Vitamin D, double up on it - Vitamin C -Zinc

## 2020-01-16 ENCOUNTER — Other Ambulatory Visit: Payer: Self-pay | Admitting: Adult Health

## 2020-01-16 ENCOUNTER — Encounter: Payer: Self-pay | Admitting: Adult Health

## 2020-01-16 DIAGNOSIS — I700 Atherosclerosis of aorta: Secondary | ICD-10-CM | POA: Insufficient documentation

## 2020-01-16 DIAGNOSIS — B9689 Other specified bacterial agents as the cause of diseases classified elsewhere: Secondary | ICD-10-CM

## 2020-01-16 DIAGNOSIS — J028 Acute pharyngitis due to other specified organisms: Secondary | ICD-10-CM

## 2020-01-16 LAB — COMPLETE METABOLIC PANEL WITH GFR
AG Ratio: 1.6 (calc) (ref 1.0–2.5)
ALT: 89 U/L — ABNORMAL HIGH (ref 9–46)
AST: 42 U/L — ABNORMAL HIGH (ref 10–40)
Albumin: 4.3 g/dL (ref 3.6–5.1)
Alkaline phosphatase (APISO): 60 U/L (ref 36–130)
BUN: 22 mg/dL (ref 7–25)
CO2: 27 mmol/L (ref 20–32)
Calcium: 9.6 mg/dL (ref 8.6–10.3)
Chloride: 105 mmol/L (ref 98–110)
Creat: 0.93 mg/dL (ref 0.60–1.35)
GFR, Est African American: 111 mL/min/{1.73_m2} (ref >=60)
GFR, Est Non African American: 96 mL/min/{1.73_m2} (ref >=60)
Globulin: 2.7 g/dL (calc) (ref 1.9–3.7)
Glucose, Bld: 122 mg/dL — ABNORMAL HIGH (ref 65–99)
Potassium: 4.1 mmol/L (ref 3.5–5.3)
Sodium: 139 mmol/L (ref 135–146)
Total Bilirubin: 0.5 mg/dL (ref 0.2–1.2)
Total Protein: 7 g/dL (ref 6.1–8.1)

## 2020-01-16 LAB — LIPID PANEL
Cholesterol: 152 mg/dL (ref <200)
HDL: 36 mg/dL — ABNORMAL LOW (ref >=40)
LDL Cholesterol (Calc): 95 mg/dL (calc)
Non-HDL Cholesterol (Calc): 116 mg/dL (calc) (ref <130)
Total CHOL/HDL Ratio: 4.2 (calc) (ref <5.0)
Triglycerides: 113 mg/dL (ref <150)

## 2020-01-16 LAB — CBC WITH DIFFERENTIAL/PLATELET
Absolute Monocytes: 826 cells/uL (ref 200–950)
Basophils Absolute: 89 cells/uL (ref 0–200)
Basophils Relative: 0.7 %
Eosinophils Absolute: 229 cells/uL (ref 15–500)
Eosinophils Relative: 1.8 %
HCT: 46.7 % (ref 38.5–50.0)
Hemoglobin: 16.2 g/dL (ref 13.2–17.1)
Lymphs Abs: 3099 cells/uL (ref 850–3900)
MCH: 30.6 pg (ref 27.0–33.0)
MCHC: 34.7 g/dL (ref 32.0–36.0)
MCV: 88.1 fL (ref 80.0–100.0)
MPV: 11.4 fL (ref 7.5–12.5)
Monocytes Relative: 6.5 %
Neutro Abs: 8458 cells/uL — ABNORMAL HIGH (ref 1500–7800)
Neutrophils Relative %: 66.6 %
Platelets: 237 10*3/uL (ref 140–400)
RBC: 5.3 10*6/uL (ref 4.20–5.80)
RDW: 12 % (ref 11.0–15.0)
Total Lymphocyte: 24.4 %
WBC: 12.7 10*3/uL — ABNORMAL HIGH (ref 3.8–10.8)

## 2020-01-16 LAB — HEMOGLOBIN A1C
Hgb A1c MFr Bld: 8.4 % of total Hgb — ABNORMAL HIGH (ref <5.7)
Mean Plasma Glucose: 194 (calc)
eAG (mmol/L): 10.8 (calc)

## 2020-01-16 LAB — MAGNESIUM: Magnesium: 2 mg/dL (ref 1.5–2.5)

## 2020-01-16 LAB — TSH: TSH: 1.64 mIU/L (ref 0.40–4.50)

## 2020-01-16 MED ORDER — AZITHROMYCIN 250 MG PO TABS: ORAL_TABLET | ORAL | 1 refills | Status: AC

## 2020-01-16 MED ORDER — ROSUVASTATIN CALCIUM 5 MG PO TABS
ORAL_TABLET | ORAL | 0 refills | Status: DC
Start: 2020-01-16 — End: 2020-04-16

## 2020-02-07 ENCOUNTER — Other Ambulatory Visit: Payer: Self-pay | Admitting: Adult Health

## 2020-02-07 DIAGNOSIS — E559 Vitamin D deficiency, unspecified: Secondary | ICD-10-CM

## 2020-02-07 MED ORDER — D3-50 1.25 MG (50000 UT) PO CAPS: ORAL_CAPSULE | ORAL | 3 refills | Status: DC

## 2020-03-17 ENCOUNTER — Other Ambulatory Visit: Payer: Self-pay | Admitting: Adult Health

## 2020-03-20 ENCOUNTER — Other Ambulatory Visit: Payer: Self-pay | Admitting: Adult Health

## 2020-03-20 DIAGNOSIS — R361 Hematospermia: Secondary | ICD-10-CM

## 2020-03-21 LAB — URINALYSIS W MICROSCOPIC + REFLEX CULTURE
Bacteria, UA: NONE SEEN /HPF
Bilirubin Urine: NEGATIVE
Glucose, UA: NEGATIVE
Hgb urine dipstick: NEGATIVE
Hyaline Cast: NONE SEEN /LPF
Ketones, ur: NEGATIVE
Leukocyte Esterase: NEGATIVE
Nitrites, Initial: NEGATIVE
Protein, ur: NEGATIVE
Specific Gravity, Urine: 1.013 (ref 1.001–1.03)
Squamous Epithelial / HPF: NONE SEEN /HPF (ref <=5)
WBC, UA: NONE SEEN /HPF (ref 0–5)
pH: 5.5 (ref 5.0–8.0)

## 2020-03-21 LAB — NO CULTURE INDICATED

## 2020-03-31 ENCOUNTER — Other Ambulatory Visit: Payer: Self-pay | Admitting: Internal Medicine

## 2020-03-31 DIAGNOSIS — I10 Essential (primary) hypertension: Secondary | ICD-10-CM

## 2020-04-15 ENCOUNTER — Other Ambulatory Visit: Payer: Self-pay

## 2020-04-15 ENCOUNTER — Ambulatory Visit: Payer: Managed Care, Other (non HMO) | Admitting: Adult Health

## 2020-04-15 ENCOUNTER — Encounter: Payer: Self-pay | Admitting: Adult Health

## 2020-04-15 ENCOUNTER — Ambulatory Visit (INDEPENDENT_AMBULATORY_CARE_PROVIDER_SITE_OTHER): Payer: Managed Care, Other (non HMO) | Admitting: Adult Health

## 2020-04-15 VITALS — BP 136/82 | Wt 213.0 lb

## 2020-04-15 DIAGNOSIS — E785 Hyperlipidemia, unspecified: Secondary | ICD-10-CM | POA: Diagnosis not present

## 2020-04-15 DIAGNOSIS — I700 Atherosclerosis of aorta: Secondary | ICD-10-CM | POA: Diagnosis not present

## 2020-04-15 DIAGNOSIS — N182 Chronic kidney disease, stage 2 (mild): Secondary | ICD-10-CM

## 2020-04-15 DIAGNOSIS — E663 Overweight: Secondary | ICD-10-CM

## 2020-04-15 DIAGNOSIS — R519 Headache, unspecified: Secondary | ICD-10-CM

## 2020-04-15 DIAGNOSIS — E1122 Type 2 diabetes mellitus with diabetic chronic kidney disease: Secondary | ICD-10-CM

## 2020-04-15 DIAGNOSIS — Z79899 Other long term (current) drug therapy: Secondary | ICD-10-CM

## 2020-04-15 DIAGNOSIS — E1169 Type 2 diabetes mellitus with other specified complication: Secondary | ICD-10-CM | POA: Diagnosis not present

## 2020-04-15 DIAGNOSIS — I10 Essential (primary) hypertension: Secondary | ICD-10-CM | POA: Diagnosis not present

## 2020-04-15 DIAGNOSIS — E559 Vitamin D deficiency, unspecified: Secondary | ICD-10-CM

## 2020-04-15 DIAGNOSIS — R509 Fever, unspecified: Secondary | ICD-10-CM

## 2020-04-15 MED ORDER — LISINOPRIL 5 MG PO TABS: ORAL_TABLET | ORAL | 1 refills | Status: DC

## 2020-04-15 MED ORDER — PREDNISONE 20 MG PO TABS
ORAL_TABLET | ORAL | 0 refills | Status: DC
Start: 2020-04-15 — End: 2020-04-20

## 2020-04-15 NOTE — Patient Instructions (Signed)

## 2020-04-15 NOTE — Progress Notes (Signed)
FOLLOW UP  Assessment and Plan:   Hypertension Well controlled with current medications  Monitor blood pressure at home; patient to call if consistently greater than 130/80 Continue DASH diet.   Reminder to go to the ER if any CP, SOB, nausea, dizziness, severe HA, changes vision/speech, left arm numbness and tingling and jaw pain.  Cholesterol Currently above goal of LDL <70; never started rosuvastatin, very motivated for lifestyle to avoid meds Continue low cholesterol diet and exercise.  Check lipid panel.   Diabetes with diabetic chronic kidney disease Continue medication: metformin  Continue diet and exercise.  Perform daily foot/skin check, notify office of any concerning changes.  Check A1C  BMI 28 Long discussion about weight loss, diet, and exercise Recommended diet heavy in fruits and veggies and low in animal meats, cheeses, and dairy products, appropriate calorie intake Discussed ideal weight for height  Continue current efforts with lifestyle modification Will follow up in 3 months  Vitamin D Def continue supplementation to maintain goal of 70-100 Defer Vit D level to CPE  Headache/fever Non-specific history, benign exam without focal sx suggestive of bacterial etiology  Check CBC, CMP/GFR, UA Encouraged him to present for Covid 19 testing - he plans to go [facility] [address] Discussed the importance of avoiding unnecessary antibiotic therapy. Suggested symptomatic OTC remedies. Nasal saline spray for congestion. Nasal steroids, allergy pill, oral steroids offered Follow up as needed. Will go to the ER if worsening headache, neck stiffness, changes vision/speech, imbalance, weakness.   Continue diet and meds as discussed. Further disposition pending results of labs. Discussed med's effects and SE's.   Over 30 minutes of exam, counseling, chart review, and critical decision making was performed.   Future Appointments  Date Time Provider [place]   08/04/2020  3:00 PM Garnet Sierras, NP [facility] None    ----------------------------------------------------------------------------------------------------------------------  HPI 49 y.o. male  presents for overdue 3 month follow up on hypertension, cholesterol, diabetes, weight and vitamin D deficiency.   He reports yesterday he felt well, went to bed after work, evening started feeling bad, chills/rigors, sweats, had temp of 101F, head ache and nasal congestion, eyes hurt, mild sore throat, lots of mucus in throat, denies dyspnea, wheezing, cough. Denies abd pain, just queasy. Denies diarrhea/constipated/emesis. He reports had mild myalgias last night but has resolved, denies arthralgia, rash. Cannot smell.   Has taken 600 mg ibuprofen (helped with fever but no improvement in HA), zinc, vitamin C Started back taking allegra yesterday; does admit to allergies during the summer. Hasn't done well with flonase in the past   Had severe illness in March felt likely covid, He hasn't had covid 19 vaccine. Works for [employer].   BMI is Body mass index is 26.98 kg/m., he has been working on diet and exercise. Reports has cut out soda, doing lots of [place] wraps, eggs and fruit, will do atkins bars while at work.  Does sugar free flavors in water.  Wt Readings from Last 3 Encounters:  04/15/20 (!) 213 lb (96.6 kg)  01/15/20 224 lb (101.6 kg)  07/30/19 226 lb 12.8 oz (102.9 kg)   His blood pressure has been controlled at home (118/70s, etc), today their BP is BP: (!) 136/82  He does not workout but work physically active job. He denies chest pain, shortness of breath, dizziness.   He is not on cholesterol medication (was prescribed rosuvastatin 5 mg at last visit but never started, strongly prefers to work on lifestyle) and denies myalgias. His cholesterol is  not at goal. The cholesterol last visit was:   Lab Results  Component Value Date   CHOL 152 01/15/2020   HDL 36 (L) 01/15/2020   LDLCALC  95 01/15/2020   TRIG 113 01/15/2020   CHOLHDL 4.2 01/15/2020    He has been working on diet and exercise for T2 diabetes on metformin (2000 mg), and denies foot ulcerations, increased appetite, nausea, paresthesia of the feet, polydipsia, polyuria, visual disturbances, vomiting and weight loss.  He checks fasting glucose intermittently 89-120s Last A1C in the office was:  Lab Results  Component Value Date   HGBA1C 8.4 (H) 01/15/2020   Lab Results  Component Value Date   GFRNONAA 96 01/15/2020   Patient is on Vitamin D supplement and below goal at last check:    Lab Results  Component Value Date   VD25OH 37 07/30/2019        Current Medications:  Current Outpatient Medications on File Prior to Visit  Medication Sig  . APPLE CIDER VINEGAR PO Take 1 Dose by mouth daily.  . Cholecalciferol (D3-50) 1.25 MG (50000 UT) capsule Take 1 tablet 3 x /week on Mon Wed & Fri for Severe Vitamin D Deficiency  . Cinnamon 500 MG capsule Take 1,000 mg by mouth 2 (two) times daily.  . Cyanocobalamin (B-12 PO) Take by mouth.  . fexofenadine (ALLEGRA) 180 MG tablet TAKE 1 TABLET BY MOUTH EVERY DAY  . lisinopril (ZESTRIL) 10 MG tablet TAKE 1 TABLET DAILY FOR BLOOD PRESSURE  . metFORMIN (GLUCOPHAGE-XR) 500 MG 24 hr tablet Take 2 tablets 2 x /day with Meals for Diabetes  . nitroGLYCERIN (NITROSTAT) 0.4 MG SL tablet Place 1 tablet (0.4 mg total) under the tongue every 5 (five) minutes as needed for chest pain.  . Omega-3 Fatty Acids (FISH OIL PO) Take by mouth.  . zinc gluconate 50 MG tablet Take 50 mg by mouth daily.  . predniSONE (DELTASONE) 20 MG tablet 2 tablets daily for 3 days, 1 tablet daily for 4 days.  . rosuvastatin (CRESTOR) 5 MG tablet Take 1 tab with dinner three days a week for cholesterol. (Patient not taking: Reported on 04/15/2020)   No current facility-administered medications on file prior to visit.     Allergies: No Known Allergies   Medical History:  Past Medical History:   Diagnosis Date  . Diabetes mellitus without complication ([place]) 3016  . Fatty liver   . Hypertension 2012   Family history- Reviewed and unchanged Social history- Reviewed and unchanged   Review of Systems:  Review of Systems  Constitutional: Positive for chills and fever. Negative for diaphoresis, malaise/fatigue and weight loss.  HENT: Positive for congestion. Negative for hearing loss, nosebleeds, sinus pain, sore throat and tinnitus.   Eyes: Negative for blurred vision and double vision.  Respiratory: Negative for cough, shortness of breath and wheezing.   Cardiovascular: Negative for chest pain, palpitations, orthopnea, claudication and leg swelling.  Gastrointestinal: Negative for abdominal pain, blood in stool, constipation, diarrhea, heartburn, melena, nausea and vomiting.  Genitourinary: Negative.   Musculoskeletal: Negative for joint pain and myalgias.  Skin: Negative for rash.  Neurological: Positive for headaches. Negative for dizziness, tingling, sensory change and weakness.  Endo/Heme/Allergies: Negative for polydipsia.  Psychiatric/Behavioral: Negative.   All other systems reviewed and are negative.   Physical Exam: BP (!) 136/82   Wt (!) 213 lb (96.6 kg)   BMI 26.98 kg/m  Wt Readings from Last 3 Encounters:  04/15/20 (!) 213 lb (96.6 kg)  01/15/20 224  lb (101.6 kg)  07/30/19 226 lb 12.8 oz (102.9 kg)   General Appearance: Well nourished adults male, appears to be feeling unwell but in no apparent distress. Eyes: PERRLA, conjunctiva no swelling or erythema Sinuses: No Frontal/maxillary tenderness ENT/Mouth: Ext aud canals clear, TMs without erythema, bulging. Post pharynx without swelling, or exudate on post pharynx.  Tonsils not swollen or erythematous. Hearing normal.  Neck: Supple Respiratory: Respiratory effort normal, BS equal bilaterally without rales, rhonchi, wheezing or stridor.  Cardio: RRR with no MRGs. Brisk peripheral pulses without edema.   Abdomen: Soft, + BS.  Non tender, no rebound, no palpable organomegaly.  Lymphatics: Non tender without lymphadenopathy.  Musculoskeletal: Full ROM, 5/5 strength, Normal gait Skin: Warm, dry, no lesions, ecchymosis, rash.  Neuro: Cranial nerves intact. No cerebellar symptoms. Distal sensation intact.  Psych: Awake and oriented X 3, normal affect, Insight and Judgment appropriate.    Izora Ribas, NP 12:27 PM [facility]

## 2020-04-16 ENCOUNTER — Other Ambulatory Visit: Payer: Self-pay | Admitting: Adult Health

## 2020-04-16 LAB — CBC WITH DIFFERENTIAL/PLATELET
Absolute Monocytes: 1056 cells/uL — ABNORMAL HIGH (ref 200–950)
Basophils Absolute: 58 cells/uL (ref 0–200)
Basophils Relative: 0.9 %
Eosinophils Absolute: 70 cells/uL (ref 15–500)
Eosinophils Relative: 1.1 %
HCT: 46.8 % (ref 38.5–50.0)
Hemoglobin: 16.1 g/dL (ref 13.2–17.1)
Lymphs Abs: 1619 cells/uL (ref 850–3900)
MCH: 29.9 pg (ref 27.0–33.0)
MCHC: 34.4 g/dL (ref 32.0–36.0)
MCV: 87 fL (ref 80.0–100.0)
MPV: 11.3 fL (ref 7.5–12.5)
Monocytes Relative: 16.5 %
Neutro Abs: 3597 cells/uL (ref 1500–7800)
Neutrophils Relative %: 56.2 %
Platelets: 185 10*3/uL (ref 140–400)
RBC: 5.38 10*6/uL (ref 4.20–5.80)
RDW: 12.4 % (ref 11.0–15.0)
Total Lymphocyte: 25.3 %
WBC: 6.4 10*3/uL (ref 3.8–10.8)

## 2020-04-16 LAB — COMPLETE METABOLIC PANEL WITH GFR
AG Ratio: 1.7 (calc) (ref 1.0–2.5)
ALT: 73 U/L — ABNORMAL HIGH (ref 9–46)
AST: 58 U/L — ABNORMAL HIGH (ref 10–35)
Albumin: 4.4 g/dL (ref 3.6–5.1)
Alkaline phosphatase (APISO): 62 U/L (ref 35–144)
BUN: 14 mg/dL (ref 7–25)
CO2: 25 mmol/L (ref 20–32)
Calcium: 9.7 mg/dL (ref 8.6–10.3)
Chloride: 102 mmol/L (ref 98–110)
Creat: 1.13 mg/dL (ref 0.70–1.33)
GFR, Est African American: 87 mL/min/{1.73_m2} (ref >=60)
GFR, Est Non African American: 75 mL/min/{1.73_m2} (ref >=60)
Globulin: 2.6 g/dL (calc) (ref 1.9–3.7)
Glucose, Bld: 126 mg/dL — ABNORMAL HIGH (ref 65–99)
Potassium: 4.2 mmol/L (ref 3.5–5.3)
Sodium: 137 mmol/L (ref 135–146)
Total Bilirubin: 0.8 mg/dL (ref 0.2–1.2)
Total Protein: 7 g/dL (ref 6.1–8.1)

## 2020-04-16 LAB — MAGNESIUM: Magnesium: 1.9 mg/dL (ref 1.5–2.5)

## 2020-04-16 LAB — LIPID PANEL
Cholesterol: 149 mg/dL (ref <200)
HDL: 34 mg/dL — ABNORMAL LOW (ref >=40)
LDL Cholesterol (Calc): 94 mg/dL (calc)
Non-HDL Cholesterol (Calc): 115 mg/dL (calc) (ref <130)
Total CHOL/HDL Ratio: 4.4 (calc) (ref <5.0)
Triglycerides: 117 mg/dL (ref <150)

## 2020-04-16 LAB — HEMOGLOBIN A1C
Hgb A1c MFr Bld: 6.2 % of total Hgb — ABNORMAL HIGH (ref <5.7)
Mean Plasma Glucose: 131 (calc)
eAG (mmol/L): 7.3 (calc)

## 2020-04-16 LAB — TSH: TSH: 0.9 mIU/L (ref 0.40–4.50)

## 2020-04-17 ENCOUNTER — Other Ambulatory Visit: Payer: Self-pay | Admitting: Adult Health

## 2020-04-17 DIAGNOSIS — Z8616 Personal history of COVID-19: Secondary | ICD-10-CM | POA: Insufficient documentation

## 2020-04-17 DIAGNOSIS — U071 COVID-19: Secondary | ICD-10-CM

## 2020-04-17 LAB — URINALYSIS W MICROSCOPIC + REFLEX CULTURE
Bacteria, UA: NONE SEEN /HPF
Bilirubin Urine: NEGATIVE
Hgb urine dipstick: NEGATIVE
Hyaline Cast: NONE SEEN /LPF
Leukocyte Esterase: NEGATIVE
Nitrites, Initial: NEGATIVE
RBC / HPF: NONE SEEN /HPF (ref 0–2)
Specific Gravity, Urine: 1.038 — ABNORMAL HIGH (ref 1.001–1.03)
Squamous Epithelial / HPF: NONE SEEN /HPF (ref <=5)
WBC, UA: NONE SEEN /HPF (ref 0–5)
pH: 5.5 (ref 5.0–8.0)

## 2020-04-17 LAB — NO CULTURE INDICATED

## 2020-04-17 MED ORDER — PROMETHAZINE-CODEINE 6.25-10 MG/5ML PO SYRP: 5.0000 mL | ORAL_SOLUTION | Freq: Four times a day (QID) | ORAL | 0 refills | Status: DC | PRN

## 2020-04-20 ENCOUNTER — Other Ambulatory Visit: Payer: Self-pay | Admitting: Adult Health

## 2020-04-20 MED ORDER — DEXAMETHASONE 1 MG PO TABS
ORAL_TABLET | ORAL | 0 refills | Status: DC
Start: 2020-04-20 — End: 2020-04-28

## 2020-04-21 ENCOUNTER — Other Ambulatory Visit: Payer: Self-pay | Admitting: Adult Health Nurse Practitioner

## 2020-04-21 DIAGNOSIS — M545 Low back pain, unspecified: Secondary | ICD-10-CM

## 2020-04-21 DIAGNOSIS — J0140 Acute pansinusitis, unspecified: Secondary | ICD-10-CM

## 2020-04-21 MED ORDER — AZITHROMYCIN 250 MG PO TABS: ORAL_TABLET | ORAL | 1 refills | Status: DC

## 2020-04-21 MED ORDER — TRAMADOL HCL 50 MG PO TABS: 50.0000 mg | ORAL_TABLET | Freq: Four times a day (QID) | ORAL | 0 refills | Status: DC | PRN

## 2020-04-24 ENCOUNTER — Emergency Department (HOSPITAL_COMMUNITY)
Admission: EM | Admit: 2020-04-24 | Discharge: 2020-04-25 | Disposition: A | Discharge status: Home / Self Care | Payer: Managed Care, Other (non HMO) | Source: Home / Self Care

## 2020-04-24 ENCOUNTER — Other Ambulatory Visit: Payer: Self-pay

## 2020-04-24 ENCOUNTER — Other Ambulatory Visit: Payer: Self-pay | Admitting: Adult Health

## 2020-04-24 ENCOUNTER — Encounter: Payer: Self-pay | Admitting: Adult Health

## 2020-04-24 ENCOUNTER — Ambulatory Visit (HOSPITAL_COMMUNITY)
Admission: RE | Admit: 2020-04-24 | Discharge: 2020-04-24 | Disposition: A | Discharge status: Home / Self Care | Payer: Managed Care, Other (non HMO) | Source: Ambulatory Visit | Attending: Adult Health | Admitting: Adult Health

## 2020-04-24 ENCOUNTER — Encounter (HOSPITAL_COMMUNITY): Payer: Self-pay

## 2020-04-24 ENCOUNTER — Emergency Department (HOSPITAL_COMMUNITY): Payer: Managed Care, Other (non HMO)

## 2020-04-24 DIAGNOSIS — R0600 Dyspnea, unspecified: Secondary | ICD-10-CM | POA: Insufficient documentation

## 2020-04-24 DIAGNOSIS — J1282 Pneumonia due to coronavirus disease 2019: Secondary | ICD-10-CM

## 2020-04-24 DIAGNOSIS — U071 COVID-19: Secondary | ICD-10-CM

## 2020-04-24 DIAGNOSIS — R059 Cough, unspecified: Secondary | ICD-10-CM

## 2020-04-24 DIAGNOSIS — R05 Cough: Secondary | ICD-10-CM | POA: Insufficient documentation

## 2020-04-24 DIAGNOSIS — Z5321 Procedure and treatment not carried out due to patient leaving prior to being seen by health care provider: Secondary | ICD-10-CM | POA: Insufficient documentation

## 2020-04-24 HISTORY — DX: Pneumonia due to coronavirus disease 2019: J12.82

## 2020-04-24 HISTORY — DX: COVID-19: U07.1

## 2020-04-24 NOTE — ED Triage Notes (Signed)
Arrived POV from home. Patient reports he was diagnosed with COVID last Thursday. Patient has dry cough, seen at urgent care and diagnosed with Bilateral Pneumonia.

## 2020-04-25 NOTE — ED Notes (Signed)
Pt eloped from waiting area. Called 3X.  

## 2020-04-26 ENCOUNTER — Emergency Department (HOSPITAL_BASED_OUTPATIENT_CLINIC_OR_DEPARTMENT_OTHER): Payer: Managed Care, Other (non HMO)

## 2020-04-26 ENCOUNTER — Encounter (HOSPITAL_BASED_OUTPATIENT_CLINIC_OR_DEPARTMENT_OTHER): Payer: Self-pay | Admitting: Emergency Medicine

## 2020-04-26 ENCOUNTER — Other Ambulatory Visit: Payer: Self-pay

## 2020-04-26 ENCOUNTER — Inpatient Hospital Stay (HOSPITAL_BASED_OUTPATIENT_CLINIC_OR_DEPARTMENT_OTHER)
Admission: EM | Admit: 2020-04-26 | Discharge: 2020-04-28 | DRG: 177 | Disposition: A | Discharge status: Home / Self Care | Payer: Managed Care, Other (non HMO) | Attending: Internal Medicine | Admitting: Internal Medicine

## 2020-04-26 DIAGNOSIS — R0609 Other forms of dyspnea: Secondary | ICD-10-CM

## 2020-04-26 DIAGNOSIS — Z87891 Personal history of nicotine dependence: Secondary | ICD-10-CM

## 2020-04-26 DIAGNOSIS — T380X5A Adverse effect of glucocorticoids and synthetic analogues, initial encounter: Secondary | ICD-10-CM | POA: Diagnosis present

## 2020-04-26 DIAGNOSIS — U071 COVID-19: Principal | ICD-10-CM | POA: Diagnosis present

## 2020-04-26 DIAGNOSIS — E1169 Type 2 diabetes mellitus with other specified complication: Secondary | ICD-10-CM | POA: Diagnosis present

## 2020-04-26 DIAGNOSIS — Z7984 Long term (current) use of oral hypoglycemic drugs: Secondary | ICD-10-CM | POA: Diagnosis not present

## 2020-04-26 DIAGNOSIS — I10 Essential (primary) hypertension: Secondary | ICD-10-CM | POA: Diagnosis present

## 2020-04-26 DIAGNOSIS — J9601 Acute respiratory failure with hypoxia: Secondary | ICD-10-CM | POA: Diagnosis not present

## 2020-04-26 DIAGNOSIS — Z79899 Other long term (current) drug therapy: Secondary | ICD-10-CM | POA: Diagnosis not present

## 2020-04-26 DIAGNOSIS — Z7982 Long term (current) use of aspirin: Secondary | ICD-10-CM | POA: Diagnosis not present

## 2020-04-26 DIAGNOSIS — N182 Chronic kidney disease, stage 2 (mild): Secondary | ICD-10-CM | POA: Diagnosis not present

## 2020-04-26 DIAGNOSIS — J1282 Pneumonia due to coronavirus disease 2019: Secondary | ICD-10-CM | POA: Diagnosis present

## 2020-04-26 DIAGNOSIS — E785 Hyperlipidemia, unspecified: Secondary | ICD-10-CM | POA: Diagnosis present

## 2020-04-26 DIAGNOSIS — R0902 Hypoxemia: Secondary | ICD-10-CM

## 2020-04-26 DIAGNOSIS — K760 Fatty (change of) liver, not elsewhere classified: Secondary | ICD-10-CM | POA: Diagnosis present

## 2020-04-26 DIAGNOSIS — E1122 Type 2 diabetes mellitus with diabetic chronic kidney disease: Secondary | ICD-10-CM

## 2020-04-26 DIAGNOSIS — E1165 Type 2 diabetes mellitus with hyperglycemia: Secondary | ICD-10-CM | POA: Diagnosis present

## 2020-04-26 DIAGNOSIS — E1129 Type 2 diabetes mellitus with other diabetic kidney complication: Secondary | ICD-10-CM | POA: Diagnosis present

## 2020-04-26 HISTORY — DX: Chronic kidney disease, unspecified: N18.9

## 2020-04-26 LAB — COMPREHENSIVE METABOLIC PANEL
ALT: 84 U/L — ABNORMAL HIGH (ref 0–44)
AST: 36 U/L (ref 15–41)
Albumin: 2.9 g/dL — ABNORMAL LOW (ref 3.5–5.0)
Alkaline Phosphatase: 55 U/L (ref 38–126)
Anion gap: 12 (ref 5–15)
BUN: 17 mg/dL (ref 6–20)
CO2: 23 mmol/L (ref 22–32)
Calcium: 8.8 mg/dL — ABNORMAL LOW (ref 8.9–10.3)
Chloride: 99 mmol/L (ref 98–111)
Creatinine, Ser: 0.92 mg/dL (ref 0.61–1.24)
GFR calc Af Amer: >60 mL/min (ref >60)
GFR calc non Af Amer: >60 mL/min (ref >60)
Glucose, Bld: 194 mg/dL — ABNORMAL HIGH (ref 70–99)
Potassium: 3.9 mmol/L (ref 3.5–5.1)
Sodium: 134 mmol/L — ABNORMAL LOW (ref 135–145)
Total Bilirubin: 0.8 mg/dL (ref 0.3–1.2)
Total Protein: 6.2 g/dL — ABNORMAL LOW (ref 6.5–8.1)

## 2020-04-26 LAB — D-DIMER, QUANTITATIVE: D-Dimer, Quant: 0.58 ug/mL-FEU — ABNORMAL HIGH (ref 0.00–0.50)

## 2020-04-26 LAB — CBC WITH DIFFERENTIAL/PLATELET
Abs Immature Granulocytes: 0.07 10*3/uL (ref 0.00–0.07)
Basophils Absolute: 0 10*3/uL (ref 0.0–0.1)
Basophils Relative: 0 %
Eosinophils Absolute: 0 10*3/uL (ref 0.0–0.5)
Eosinophils Relative: 0 %
HCT: 46.8 % (ref 39.0–52.0)
Hemoglobin: 16.4 g/dL (ref 13.0–17.0)
Immature Granulocytes: 1 %
Lymphocytes Relative: 10 %
Lymphs Abs: 1.3 10*3/uL (ref 0.7–4.0)
MCH: 30 pg (ref 26.0–34.0)
MCHC: 35 g/dL (ref 30.0–36.0)
MCV: 85.7 fL (ref 80.0–100.0)
Monocytes Absolute: 0.5 10*3/uL (ref 0.1–1.0)
Monocytes Relative: 4 %
Neutro Abs: 11.5 10*3/uL — ABNORMAL HIGH (ref 1.7–7.7)
Neutrophils Relative %: 85 %
Platelets: 161 10*3/uL (ref 150–400)
RBC: 5.46 MIL/uL (ref 4.22–5.81)
RDW: 11.6 % (ref 11.5–15.5)
WBC: 13.5 10*3/uL — ABNORMAL HIGH (ref 4.0–10.5)
nRBC: 0 % (ref 0.0–0.2)

## 2020-04-26 LAB — GLUCOSE, CAPILLARY: Glucose-Capillary: 319 mg/dL — ABNORMAL HIGH (ref 70–99)

## 2020-04-26 LAB — LACTATE DEHYDROGENASE: LDH: 251 U/L — ABNORMAL HIGH (ref 98–192)

## 2020-04-26 LAB — CBG MONITORING, ED
Glucose-Capillary: 227 mg/dL — ABNORMAL HIGH (ref 70–99)
Glucose-Capillary: 315 mg/dL — ABNORMAL HIGH (ref 70–99)

## 2020-04-26 LAB — LACTIC ACID, PLASMA
Lactic Acid, Venous: 2.5 mmol/L (ref 0.5–1.9)
Lactic Acid, Venous: 1.6 mmol/L (ref 0.5–1.9)

## 2020-04-26 LAB — FIBRINOGEN: Fibrinogen: 582 mg/dL — ABNORMAL HIGH (ref 210–475)

## 2020-04-26 LAB — SARS CORONAVIRUS 2 BY RT PCR (HOSPITAL ORDER, PERFORMED IN ~~LOC~~ HOSPITAL LAB): SARS Coronavirus 2: POSITIVE — AB

## 2020-04-26 LAB — C-REACTIVE PROTEIN: CRP: 6.1 mg/dL — ABNORMAL HIGH (ref <1.0)

## 2020-04-26 LAB — HIV ANTIBODY (ROUTINE TESTING W REFLEX): HIV Screen 4th Generation wRfx: NONREACTIVE

## 2020-04-26 LAB — TRIGLYCERIDES: Triglycerides: 112 mg/dL (ref <150)

## 2020-04-26 LAB — PROCALCITONIN: Procalcitonin: 0.13 ng/mL

## 2020-04-26 LAB — FERRITIN: Ferritin: 1063 ng/mL — ABNORMAL HIGH (ref 24–336)

## 2020-04-26 MED ORDER — ZINC SULFATE 220 (50 ZN) MG PO CAPS
220.0000 mg | ORAL_CAPSULE | Freq: Every day | ORAL | Status: DC
  Administered 2020-04-27 – 2020-04-28 (×2): 220 mg via ORAL
  Filled 2020-04-26 (×3): qty 1

## 2020-04-26 MED ORDER — ALBUTEROL SULFATE HFA 108 (90 BASE) MCG/ACT IN AERS
2.0000 | INHALATION_SPRAY | Freq: Four times a day (QID) | RESPIRATORY_TRACT | Status: DC
  Administered 2020-04-26 – 2020-04-28 (×9): 2 via RESPIRATORY_TRACT
  Filled 2020-04-26: qty 6.7

## 2020-04-26 MED ORDER — SODIUM CHLORIDE 0.9 % IV SOLN: Freq: Once | INTRAVENOUS | Status: DC

## 2020-04-26 MED ORDER — SODIUM CHLORIDE 0.9 % IV SOLN
100.0000 mg | Freq: Every day | INTRAVENOUS | Status: DC
  Administered 2020-04-26: 100 mg via INTRAVENOUS
  Filled 2020-04-26: qty 20

## 2020-04-26 MED ORDER — INSULIN ASPART 100 UNIT/ML ~~LOC~~ SOLN
0.0000 [IU] | Freq: Three times a day (TID) | SUBCUTANEOUS | Status: DC
  Administered 2020-04-26: 7 [IU] via SUBCUTANEOUS
  Administered 2020-04-26: 3 [IU] via SUBCUTANEOUS
  Filled 2020-04-26: qty 3
  Filled 2020-04-26: qty 7

## 2020-04-26 MED ORDER — DEXAMETHASONE SODIUM PHOSPHATE 10 MG/ML IJ SOLN
10.0000 mg | Freq: Once | INTRAMUSCULAR | Status: AC
  Administered 2020-04-26: 10 mg via INTRAVENOUS
  Filled 2020-04-26: qty 1

## 2020-04-26 MED ORDER — SODIUM CHLORIDE 0.9 % IV SOLN
200.0000 mg | Freq: Once | INTRAVENOUS | Status: DC
  Filled 2020-04-26: qty 40

## 2020-04-26 MED ORDER — ACETAMINOPHEN 325 MG PO TABS
650.0000 mg | ORAL_TABLET | Freq: Once | ORAL | Status: AC
  Administered 2020-04-26: 650 mg via ORAL
  Filled 2020-04-26: qty 2

## 2020-04-26 MED ORDER — GUAIFENESIN-DM 100-10 MG/5ML PO SYRP
10.0000 mL | ORAL_SOLUTION | ORAL | Status: DC | PRN
  Administered 2020-04-27 – 2020-04-28 (×2): 10 mL via ORAL
  Filled 2020-04-26 (×3): qty 10

## 2020-04-26 MED ORDER — SODIUM CHLORIDE 0.9 % IV SOLN
100.0000 mg | Freq: Every day | INTRAVENOUS | Status: DC
  Administered 2020-04-27 – 2020-04-28 (×2): 100 mg via INTRAVENOUS
  Filled 2020-04-26 (×2): qty 20

## 2020-04-26 MED ORDER — HYDROCOD POLST-CPM POLST ER 10-8 MG/5ML PO SUER
5.0000 mL | Freq: Two times a day (BID) | ORAL | Status: DC | PRN
  Administered 2020-04-26 – 2020-04-27 (×2): 5 mL via ORAL
  Filled 2020-04-26 (×2): qty 5

## 2020-04-26 MED ORDER — INSULIN ASPART 100 UNIT/ML ~~LOC~~ SOLN
0.0000 [IU] | Freq: Every day | SUBCUTANEOUS | Status: DC
  Administered 2020-04-26: 4 [IU] via SUBCUTANEOUS

## 2020-04-26 MED ORDER — SODIUM CHLORIDE 0.9 % IV SOLN
100.0000 mg | Freq: Every day | INTRAVENOUS | Status: DC
  Administered 2020-04-26: 100 mg via INTRAVENOUS

## 2020-04-26 MED ORDER — FAMOTIDINE 20 MG PO TABS
20.0000 mg | ORAL_TABLET | Freq: Two times a day (BID) | ORAL | Status: DC
  Administered 2020-04-26 – 2020-04-28 (×5): 20 mg via ORAL
  Filled 2020-04-26 (×5): qty 1

## 2020-04-26 MED ORDER — ASCORBIC ACID 500 MG PO TABS
500.0000 mg | ORAL_TABLET | Freq: Every day | ORAL | Status: DC
  Administered 2020-04-27 – 2020-04-28 (×2): 500 mg via ORAL
  Filled 2020-04-26 (×3): qty 1

## 2020-04-26 MED ORDER — METHYLPREDNISOLONE SODIUM SUCC 125 MG IJ SOLR
0.5000 mg/kg | Freq: Two times a day (BID) | INTRAMUSCULAR | Status: DC
  Administered 2020-04-27 – 2020-04-28 (×3): 46.875 mg via INTRAVENOUS
  Filled 2020-04-26 (×3): qty 2

## 2020-04-26 MED ORDER — SODIUM CHLORIDE 0.9 % IV SOLN: 100.0000 mg | Freq: Every day | INTRAVENOUS | Status: DC

## 2020-04-26 MED ORDER — SODIUM CHLORIDE 0.9 % IV SOLN: INTRAVENOUS | Status: DC | PRN

## 2020-04-26 NOTE — ED Notes (Signed)
Pt placed in prone

## 2020-04-26 NOTE — ED Notes (Signed)
Attempted to call report to floor, no answer from primary RN

## 2020-04-26 NOTE — Plan of Care (Signed)
  Problem: Education: Goal: Knowledge of risk factors and measures for prevention of condition will improve Outcome: Progressing   Problem: Coping: Goal: Psychosocial and spiritual needs will be supported Outcome: Progressing   Problem: Respiratory: Goal: Will maintain a patent airway Outcome: Progressing Goal: Complications related to the disease process, condition or treatment will be avoided or minimized Outcome: Progressing   

## 2020-04-26 NOTE — ED Notes (Signed)
ED MD informed of Lactic Acid 2.5

## 2020-04-26 NOTE — H&P (Signed)
History and Physical    Gregory Jacobs QIW:[id number] DOB: 1971-01-12 DOA: 04/26/2020  PCP: Unk Pinto, MD   Patient coming from: home  Chief Complaint: SOB, cough  HPI: Gregory Jacobs is a 49 y.o. male with medical history significant for has a history of hypertension and diabetes.  Began getting sick 5 days ago with fevers chills myalgias cough shortness of breath fatigue.  Tested positive for Covid 4 days ago.  Has been checking his oxygen saturations and was as low as 81% today. He was seen at [facility] and found to have hypoxia is worsening with his COVID-19.  He was accepted and transferred to [facility] unit at [facility] and has now been transferred as a bed became available.  He denies having any fever this morning.  He states that when he woke up this morning he had a coughing fit and it was after that that he used his wife's pulse oximeter and noticed his oxygen saturation at 81% he continued to have shortness of breath that worsened with exertion to go to emergency room for evaluation.  He reports he did have some diarrhea but has not had any nausea or vomiting.  He denies any rash, syncope, chest pain, palpitations.  He denies any urinary symptoms. He has history of smoking but quit 6 years ago.  Drinks alcohol socially.  Denies illicit drug use   Review of Systems:  General: Denies weakness, fever, weight loss, night sweats.  States he did have chills this morning.  Denies dizziness.  Denies change in appetite HENT: Denies head trauma, headache, denies change in hearing, tinnitus.  Denies nasal congestion or bleeding.  Denies sore throat, sores in mouth.  Denies difficulty swallowing Eyes: Denies blurry vision, pain in eye, drainage.  Denies discoloration of eyes. Neck: Denies pain.  Denies swelling.  Denies pain with movement. Cardiovascular: Denies chest pain, palpitations.  Denies edema.  Denies orthopnea Respiratory: Reports shortness of breath with nonproductive cough.  Denies  wheezing.  Denies sputum production Gastrointestinal: Denies abdominal pain, swelling.  Denies nausea, vomiting.  Reports diarrhea.  Denies melena.  Denies hematemesis. Musculoskeletal: Denies limitation of movement.  Denies deformity or swelling.  Denies pain.  Denies arthralgias or myalgias. Genitourinary: Denies pelvic pain.  Denies urinary frequency or hesitancy.  Denies dysuria.  Skin: Denies rash.  Denies petechiae, purpura, ecchymosis. Neurological: Denies headache.  Denies syncope.  Denies seizure activity.  Denies weakness or paresthesia.  Denies slurred speech, drooping face.  Denies visual change. Psychiatric: Denies depression, anxiety.  Denies suicidal thoughts or ideation.  Denies hallucinations.  Past Medical History:  Diagnosis Date  . Chronic kidney disease   . Diabetes mellitus without complication ([place]) 4174  . Fatty liver   . Hypertension 2012    Past Surgical History:  Procedure Laterality Date  . CHOLECYSTECTOMY, LAPAROSCOPIC  2011  . VASECTOMY  2016    Social History  reports that he quit smoking about 6 years ago. His smoking use included cigarettes. He started smoking about 25 years ago. He smoked 0.50 packs per day. His smokeless tobacco use includes chew. He reports that he does not use drugs. No history on file for alcohol use.  No Known Allergies  Family History  Problem Relation Age of Onset  . Breast cancer Mother   . Hypertension Father   . Heart attack Father 80  . Suicidality Sister 49  . Breast cancer Maternal Aunt 60  . Diabetes Paternal Uncle      Prior to Admission medications  Medication Sig Start Date End Date Taking? Authorizing Provider  APPLE CIDER VINEGAR PO Take 2 capsules by mouth daily.    Yes [provider]  ascorbic acid (VITAMIN C) 500 MG tablet Take 500-1,000 mg by mouth daily.   Yes [provider]  aspirin EC 81 MG tablet Take 81 mg by mouth in the morning. Swallow whole.   Yes [provider]    azithromycin (ZITHROMAX) 250 MG tablet Take 2 tablets (500 mg) on  Day 1,  followed by 1 tablet (250 mg) once daily on Days 2 through 5. Patient taking differently: Take 250-500 mg by mouth See admin instructions. Take 2 tablets (500 mg) on  Day 1,  followed by 1 tablet (250 mg) once daily on Days 2 through 5. 04/21/20 04/26/20 Yes McClanahan, Kyra, NP  Cholecalciferol (D3-50) 1.25 MG (50000 UT) capsule Take 1 tablet 3 x /week on Mon Wed & Fri for Severe Vitamin D Deficiency Patient taking differently: Take 50,000 Units by mouth every 3 (three) days.  02/07/20  Yes Liane Comber, NP  Cyanocobalamin (VITAMIN B-12) 5000 MCG SUBL Place 5,000 mcg under the tongue daily.   Yes [provider]  dexamethasone (DECADRON) 1 MG tablet Take 1 tablet PO TID for 3 days and then BID for 3 days and daily for 5 days. Patient taking differently: Take 1 mg by mouth See admin instructions. Take 1 mg by mouth three times a day for 3 days, 1 mg two times a day for 3 days, and 1 mg once a day for 5 days 04/20/20  Yes Liane Comber, NP  fexofenadine (ALLEGRA) 180 MG tablet TAKE 1 TABLET BY MOUTH EVERY DAY Patient taking differently: Take 180 mg by mouth daily as needed for allergies or rhinitis.  03/17/20  Yes Liane Comber, NP  lisinopril (ZESTRIL) 5 MG tablet TAKE 1 TABLET DAILY FOR BLOOD PRESSURE Patient taking differently: Take 5 mg by mouth daily.  04/15/20  Yes Liane Comber, NP  metFORMIN (GLUCOPHAGE-XR) 500 MG 24 hr tablet Take 2 tablets 2 x /day with Meals for Diabetes Patient taking differently: Take 1,000 mg by mouth 2 (two) times daily.  07/08/19  Yes Unk Pinto, MD  nitroGLYCERIN (NITROSTAT) 0.4 MG SL tablet Place 1 tablet (0.4 mg total) under the tongue every 5 (five) minutes as needed for chest pain. 05/16/19 05/15/20 Yes McClanahan, Danton Sewer, NP  promethazine-codeine (PHENERGAN WITH CODEINE) 6.25-10 MG/5ML syrup Take 5 mLs by mouth every 6 (six) hours as needed for cough. Max: 29mL per day Patient  taking differently: Take 5 mLs by mouth every 6 (six) hours as needed for cough (max of 20 ml's/day).  04/17/20  Yes Liane Comber, NP  traMADol (ULTRAM) 50 MG tablet Take 1 tablet (50 mg total) by mouth every 6 (six) hours as needed. Patient taking differently: Take 50 mg by mouth every 6 (six) hours as needed (for pain).  04/21/20 04/21/21 Yes McClanahan, Danton Sewer, NP  VITAMIN E PO Take 1 capsule by mouth daily.   Yes [provider]  zinc gluconate 50 MG tablet Take 200 mg by mouth daily.    Yes [provider]  Cinnamon 500 MG capsule Take 500 mg by mouth 2 (two) times daily.     [provider]  Cyanocobalamin (B-12 PO) Take by mouth. Patient not taking: Reported on 04/26/2020    [provider]  Omega-3 Fatty Acids (FISH OIL PO) Take by mouth. Patient not taking: Reported on 04/26/2020    [provider]  Physical Exam: Vitals:   04/26/20 1728 04/26/20 1800 04/26/20 1859 04/26/20 2000  BP: 123/75 118/68  118/71  Pulse: 80 87  83  Resp: (!) 28 (!) 24  (!) 21  Temp:    98.6 F (37 C)  TempSrc:    Oral  SpO2: 97% 96% 92% 94%  Weight:    98.3 kg  Height:    6' 2.5" (1.892 m)    Constitutional: NAD, calm, comfortable Vitals:   04/26/20 1728 04/26/20 1800 04/26/20 1859 04/26/20 2000  BP: 123/75 118/68  118/71  Pulse: 80 87  83  Resp: (!) 28 (!) 24  (!) 21  Temp:    98.6 F (37 C)  TempSrc:    Oral  SpO2: 97% 96% 92% 94%  Weight:    98.3 kg  Height:    6' 2.5" (1.892 m)   General: WDWN, Alert and oriented x3.  Eyes: EOMI, PERRL, lids and conjunctivae normal.  Sclera nonicteric HENT:  [place]/AT, external ears normal.  Nares patent without epistasis.  Mucous membranes are moist. Posterior pharynx clear of any exudate or lesions. Normal dentition.  Neck: Soft, normal range of motion, supple, no masses, no thyromegaly.  Trachea midline Respiratory: Equal but diminished breath sounds with diffuse scattered Rales.  No rhonchi.  No wheezing, no  crackles. Normal respiratory effort. No accessory muscle use.  Cardiovascular: Regular rate and rhythm, no murmurs / rubs / gallops. No extremity edema. 2+ pedal pulses. Abdomen: Soft, no tenderness, nondistended, no rebound or guarding.  No masses palpated.  Bowel sounds normoactive. No HSM. Musculoskeletal: FROM. no clubbing / cyanosis. No joint deformity upper and lower extremities. no contractures. Normal muscle tone.  Skin: Warm, dry, intact no rashes, lesions, ulcers. No induration Neurologic: CN 2-12 grossly intact.  Normal speech.  Sensation intact, patella DTR +1 bilaterally. Strength 5/5 in all extremities.   Psychiatric: Normal judgment and insight.  Normal mood.    Labs on Admission: I have personally reviewed following labs and imaging studies  CBC: Recent Labs  Lab 04/26/20 0834  WBC 13.5*  NEUTROABS 11.5*  HGB 16.4  HCT 46.8  MCV 85.7  PLT 865    Basic Metabolic Panel: Recent Labs  Lab 04/26/20 0834  NA 134*  K 3.9  CL 99  CO2 23  GLUCOSE 194*  BUN 17  CREATININE 0.92  CALCIUM 8.8*    GFR: Estimated Creatinine Clearance: 113.3 mL/min (by C-G formula based on SCr of 0.92 mg/dL).  Liver Function Tests: Recent Labs  Lab 04/26/20 0834  AST 36  ALT 84*  ALKPHOS 55  BILITOT 0.8  PROT 6.2*  ALBUMIN 2.9*    Urine analysis:    Component Value Date/Time   COLORURINE DARK YELLOW 04/16/2020 0851   APPEARANCEUR TURBID (A) 04/16/2020 0851   LABSPEC 1.038 (H) 04/16/2020 0851   PHURINE 5.5 04/16/2020 0851   GLUCOSEU 1+ (A) 04/16/2020 0851   HGBUR NEGATIVE 04/16/2020 0851   BILIRUBINUR NEGATIVE 01/19/2018 0846   KETONESUR 1+ (A) 04/16/2020 0851   PROTEINUR 1+ (A) 04/16/2020 0851   NITRITE POSITIVE (A) 01/19/2018 0846   LEUKOCYTESUR LARGE (A) 01/19/2018 0846    Radiological Exams on Admission: DG Chest Port 1 View  Result Date: 04/26/2020 CLINICAL DATA:  Positive test for Covid on 04/16/2020. recently diagnosed with pneumonia. Shortness of breath.  EXAM: PORTABLE CHEST 1 VIEW COMPARISON:  04/24/2020 FINDINGS: Better lung expansion compared with most recent exam. There are persistent patchy airspace filling opacities along the periphery of the LEFT  lung. There is persistent peripheral patchy opacity in the RIGHT mid to LOWER lung zone. No pulmonary edema or effusions. IMPRESSION: Stable multifocal infiltrates. Electronically Signed   By: Nolon Nations M.D.   On: 04/26/2020 09:37    EKG: Independently reviewed.  Normal sinus rhythm.  No acute ST changes.  Normal QTc  Assessment/Plan Principal Problem:   Pneumonia due to COVID-19 virus  Mr. Turnbaugh is admitted to [facility] unit.  He is started on remdesivir and Decadron per Covid protocol Supple oxygen provided to keep O2 sat between 92-96% Albuterol MDI as needed for cough, shortness of breath, wheezing.  Active Problems:   Hypoxia Supplemental oxygen as above.  Incentive spirometer 2 hours while awake.  RT following    Essential hypertension Continue home dose of lisinopril.  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  Monitor blood sugars before every meal nightly and sliding scale insulin provide as needed for glycemic control with Decadron    DVT prophylaxis: Lovenox for DVT prophylaxis per Covid protocol Code Status:   Full code Family Communication:  Diagnosis and plan discussed with patient.  Patient verbalizes understanding and agrees with plan.  Recommendation follow as clinically indicated Disposition Plan:   Patient is from:  Home  Anticipated DC to:  Home  Anticipated DC date:  Anticipated greater than 2 midnights in the hospital to treat medical condition  Anticipated DC barriers: No barriers to discharge identified at this time  Admission status:  Inpatient  Severity of Illness: The appropriate  patient status for this patient is INPATIENT. Inpatient status is judged to be reasonable and necessary in order to provide the required intensity of service to ensure the patient's safety. The patient's presenting symptoms, physical exam findings, and initial radiographic and laboratory data in the context of their chronic comorbidities is felt to place them at high risk for further clinical deterioration. Furthermore, it is not anticipated that the patient will be medically stable for discharge from the hospital within 2 midnights of admission. The following factors support the patient status of inpatient.   " The patient's presenting symptoms include hypoxia, cough, shortness of breath. " The worrisome physical exam findings include hypoxia, diminished breath sounds. " The initial radiographic and laboratory data are worrisome because of Covid positive with oxygen requirement to maintain O2 saturation. " The chronic co-morbidities include diabetes mellitus, hypertension.   * I certify that at the point of admission it is my clinical judgment that the patient will require inpatient hospital care spanning beyond 2 midnights from the point of admission due to high intensity of service, high risk for further deterioration and high frequency of surveillance required.Yevonne Aline Egan Sahlin MD [facility]  How to contact the [facility] Attending or Consulting provider [place] or covering provider during after hours [place], for this patient?   1. Check the care team in [facility] and look for a) attending/consulting TR[facility] provider listed and b) the [facility] team listed 2. Log into [email or web address] and use [place]'s universal password to access. If you do not have the password, please contact the hospital operator. 3. Locate the [facility] provider you are looking for under [facility] and page to a number that you can be directly reached. 4. If you still have difficulty reaching the provider, please page the  [facility] (  Director on Call) for the Hospitalists listed on amion for assistance.  04/26/2020, 10:04 PM

## 2020-04-26 NOTE — ED Triage Notes (Signed)
Pt reports tested positive for COVID on 04/16/2020. Pt was diagnosed with pneumonia recently as well. Pt c/o shortness of breath after waking up coughing.  Pt able to talk in short sentences. Reports pulse ox at home was 77-82.

## 2020-04-26 NOTE — ED Provider Notes (Signed)
[facility] EMERGENCY DEPARTMENT Provider Note   CSN: [id number] Arrival date & time: 04/26/20  1638     History Chief Complaint  Patient presents with  . Shortness of Breath  . COVID POSSITIVE AS OF 04/16/2020    Gregory Jacobs is a 49 y.o. male.  Non-smoker has a history of hypertension and diabetes.  Began getting sick 5 days ago with fevers chills myalgias cough shortness of breath fatigue.  Tested positive for Covid 4 days ago.  Has been checking his oxygen saturations and was as low as 81% today.  The history is provided by the patient.  Shortness of Breath Severity:  Moderate Onset quality:  Gradual Timing:  Intermittent Progression:  Worsening Chronicity:  New Context: activity   Relieved by:  Nothing Worsened by:  Activity Ineffective treatments:  Rest Associated symptoms: cough, fever and headaches   Associated symptoms: no abdominal pain, no chest pain, no hemoptysis, no rash, no sore throat and no vomiting   Influenza Presenting symptoms: cough, fatigue, fever, headache, myalgias and shortness of breath   Presenting symptoms: no sore throat and no vomiting   Progression:  Worsening Chronicity:  New      Past Medical History:  Diagnosis Date  . Diabetes mellitus without complication ([place]) 4665  . Fatty liver   . Hypertension 2012    Patient Active Problem List   Diagnosis Date Noted  . Pneumonia due to COVID-19 virus 04/24/2020  . COVID-19 (04/16/2020) 04/17/2020  . Aortic atherosclerosis ([place]) 01/16/2020  . Pityriasis rosea 06/25/2018  . Abnormal EKG 06/25/2018  . Prostate nodule 06/22/2018  . Stage 2 chronic kidney disease due to type 2 diabetes mellitus ([place]) 06/22/2018  . Overweight (BMI 25.0-29.9) 02/08/2018  . Essential hypertension 08/25/2017  . Hyperlipidemia associated with type 2 diabetes mellitus ([place]) 08/25/2017  . Type 2 diabetes mellitus with kidney complication ([place]) 99/35/7017  . Vitamin D deficiency 08/25/2017  . Fatty  liver     Past Surgical History:  Procedure Laterality Date  . CHOLECYSTECTOMY, LAPAROSCOPIC  2011  . VASECTOMY  2016       Family History  Problem Relation Age of Onset  . Breast cancer Mother   . Hypertension Father   . Heart attack Father 53  . Suicidality Sister 39  . Breast cancer Maternal Aunt 60  . Diabetes Paternal Uncle     Social History   Tobacco Use  . Smoking status: Former Smoker    Packs/day: 0.50    Types: Cigarettes    Start date: 1996    Quit date: 2015    Years since quitting: 6.6  . Smokeless tobacco: Current User    Types: Chew  Vaping Use  . Vaping Use: Never used  Substance Use Topics  . Alcohol use: Not on file    Comment: rarely  . Drug use: No    Home Medications Prior to Admission medications   Medication Sig Start Date End Date Taking? Authorizing Provider  APPLE CIDER VINEGAR PO Take 1 Dose by mouth daily.    [provider]  azithromycin (ZITHROMAX) 250 MG tablet Take 2 tablets (500 mg) on  Day 1,  followed by 1 tablet (250 mg) once daily on Days 2 through 5. 04/21/20 04/26/20  Garnet Sierras, NP  Cholecalciferol (D3-50) 1.25 MG (50000 UT) capsule Take 1 tablet 3 x /week on Mon Wed & Fri for Severe Vitamin D Deficiency 02/07/20   Liane Comber, NP  Cinnamon 500 MG capsule Take 1,000 mg by  mouth 2 (two) times daily.    [provider]  Cyanocobalamin (B-12 PO) Take by mouth.    [provider]  dexamethasone (DECADRON) 1 MG tablet Take 1 tablet PO TID for 3 days and then BID for 3 days and daily for 5 days. 04/20/20   Liane Comber, NP  fexofenadine (ALLEGRA) 180 MG tablet TAKE 1 TABLET BY MOUTH EVERY DAY 03/17/20   Liane Comber, NP  lisinopril (ZESTRIL) 5 MG tablet TAKE 1 TABLET DAILY FOR BLOOD PRESSURE 04/15/20   Liane Comber, NP  metFORMIN (GLUCOPHAGE-XR) 500 MG 24 hr tablet Take 2 tablets 2 x /day with Meals for Diabetes 07/08/19   Unk Pinto, MD  nitroGLYCERIN (NITROSTAT) 0.4 MG SL tablet Place 1  tablet (0.4 mg total) under the tongue every 5 (five) minutes as needed for chest pain. 05/16/19 05/15/20  Garnet Sierras, NP  Omega-3 Fatty Acids (FISH OIL PO) Take by mouth.    [provider]  promethazine-codeine (PHENERGAN WITH CODEINE) 6.25-10 MG/5ML syrup Take 5 mLs by mouth every 6 (six) hours as needed for cough. Max: 60mL per day 04/17/20   Liane Comber, NP  traMADol (ULTRAM) 50 MG tablet Take 1 tablet (50 mg total) by mouth every 6 (six) hours as needed. 04/21/20 04/21/21  Garnet Sierras, NP  zinc gluconate 50 MG tablet Take 50 mg by mouth daily.    [provider]    Allergies    Patient has no known allergies.  Review of Systems   Review of Systems  Constitutional: Positive for fatigue and fever.  HENT: Negative for sore throat.   Eyes: Negative for visual disturbance.  Respiratory: Positive for cough and shortness of breath. Negative for hemoptysis.   Cardiovascular: Negative for chest pain.  Gastrointestinal: Negative for abdominal pain and vomiting.  Genitourinary: Negative for dysuria.  Musculoskeletal: Positive for myalgias.  Skin: Negative for rash.  Neurological: Positive for headaches.    Physical Exam Updated Vital Signs BP (!) 141/85 (BP Location: Right Arm)   Pulse 85   Temp 100 F (37.8 C) (Oral)   Resp (!) 24   Ht 6' 2.5" (1.892 m)   Wt 93.4 kg   SpO2 90%   BMI 26.10 kg/m   Physical Exam Vitals and nursing note reviewed.  Constitutional:      Appearance: He is well-developed. He is ill-appearing.  HENT:     Head: Normocephalic and atraumatic.  Eyes:     Conjunctiva/sclera: Conjunctivae normal.  Cardiovascular:     Rate and Rhythm: Normal rate and regular rhythm.     Heart sounds: No murmur heard.   Pulmonary:     Effort: Pulmonary effort is normal. Tachypnea present. No respiratory distress.     Breath sounds: Normal breath sounds.  Abdominal:     Palpations: Abdomen is soft.     Tenderness: There is no abdominal  tenderness.  Musculoskeletal:        General: Normal range of motion.     Cervical back: Neck supple.     Right lower leg: No tenderness. No edema.     Left lower leg: No tenderness. No edema.  Skin:    General: Skin is warm and dry.     Capillary Refill: Capillary refill takes less than 2 seconds.     Findings: No rash.  Neurological:     General: No focal deficit present.     Mental Status: He is alert.     ED Results / Procedures / Treatments  Labs (all labs ordered are listed, but only abnormal results are displayed) Labs Reviewed  SARS CORONAVIRUS 2 BY RT PCR (HOSPITAL ORDER, [facility])  CULTURE, BLOOD (ROUTINE X 2)  CULTURE, BLOOD (ROUTINE X 2)  LACTIC ACID, PLASMA  LACTIC ACID, PLASMA  CBC WITH DIFFERENTIAL/PLATELET  COMPREHENSIVE METABOLIC PANEL  D-DIMER, QUANTITATIVE (NOT AT [facility])  PROCALCITONIN  LACTATE DEHYDROGENASE  FERRITIN  TRIGLYCERIDES  FIBRINOGEN  C-REACTIVE PROTEIN    EKG None  Radiology DG Chest 2 View  Result Date: 04/24/2020 CLINICAL DATA:  COVID EXAM: CHEST - 2 VIEW COMPARISON:  December 21, 2018 FINDINGS: The cardiomediastinal silhouette is normal in contour. No pleural effusion. No pneumothorax. There are bilateral peripheral predominant heterogeneous opacities. Status post cholecystectomy. No acute osseous abnormality noted. IMPRESSION: Bilateral peripheral predominant heterogeneous opacities consistent with COVID-19 pneumonia. Electronically Signed   By: Valentino Saxon MD   On: 04/24/2020 14:40    Procedures .Critical Care Performed by: Hayden Rasmussen, MD Authorized by: Hayden Rasmussen, MD   Critical care provider statement:    Critical care time (minutes):  45   Critical care time was exclusive of:  Separately billable procedures and treating other patients   Critical care was necessary to treat or prevent imminent or life-threatening deterioration of the following conditions:  Respiratory failure    Critical care was time spent personally by me on the following activities:  Discussions with consultants, evaluation of patient's response to treatment, examination of patient, ordering and performing treatments and interventions, ordering and review of laboratory studies, ordering and review of radiographic studies, pulse oximetry, re-evaluation of patient's condition, obtaining history from patient or surrogate, review of old charts and development of treatment plan with patient or surrogate   I assumed direction of critical care for this patient from another provider in my specialty: no     (including critical care time)  Medications Ordered in ED Medications  albuterol (VENTOLIN HFA) 108 (90 Base) MCG/ACT inhaler 2 puff (2 puffs Inhalation Given 04/26/20 1409)  methylPREDNISolone sodium succinate (SOLU-MEDROL) 125 mg/2 mL injection 46.875 mg (has no administration in time range)  guaiFENesin-dextromethorphan (ROBITUSSIN DM) 100-10 MG/5ML syrup 10 mL (has no administration in time range)  chlorpheniramine-HYDROcodone (TUSSIONEX) 10-8 MG/5ML suspension 5 mL (5 mLs Oral Given 04/26/20 1034)  ascorbic acid (VITAMIN C) tablet 500 mg (500 mg Oral Not Given 04/26/20 1044)  zinc sulfate capsule 220 mg (220 mg Oral Not Given 04/26/20 1045)  insulin aspart (novoLOG) injection 0-9 Units (7 Units Subcutaneous Given 04/26/20 1711)  insulin aspart (novoLOG) injection 0-5 Units (has no administration in time range)  famotidine (PEPCID) tablet 20 mg (20 mg Oral Given 04/26/20 1035)  remdesivir 100 mg in sodium chloride 0.9 % 100 mL IVPB (0 mg Intravenous Stopped 04/26/20 1322)    And  remdesivir 100 mg in sodium chloride 0.9 % 100 mL IVPB (0 mg Intravenous Stopped 04/26/20 1229)  remdesivir 100 mg in sodium chloride 0.9 % 100 mL IVPB (has no administration in time range)  0.9 %  sodium chloride infusion ( Intravenous Stopped 04/26/20 1317)  dexamethasone (DECADRON) injection 10 mg (10 mg Intravenous Given 04/26/20 0852)    acetaminophen (TYLENOL) tablet 650 mg (650 mg Oral Given 04/26/20 0093)    ED Course  I have reviewed the triage vital signs and the nursing notes.  Pertinent labs & imaging results that were available during my care of the patient were reviewed by me and considered in my medical decision making (see  chart for details).  Clinical Course as of Apr 26 1853  Nancy Fetter Apr 26, 2020  5427 Chest x-ray interpreted by me as bibasilar pneumonia.   [MB]  [phone] Discussed with Dr. Tamala Julian [facility] hospital[facility] who will accept the patient to progressive bed at [facility].  He said he would be writing orders for Covid treatment remotely.   [MB]  7628 Patient lactate is elevated, I think this is related more to his viral Covid infection and not sepsis.  We will start some judicious fluids.   [MB]    Clinical Course User Index [MB] Hayden Rasmussen, MD   MDM Rules/Calculators/A&P                         Elisandro Jarrett was evaluated in Emergency Department on 04/26/2020 for the symptoms described in the history of present illness. He was evaluated in the context of the global COVID-19 pandemic, which necessitated consideration that the patient might be at risk for infection with the SARS-CoV-2 virus that causes COVID-19. Institutional protocols and algorithms that pertain to the evaluation of patients at risk for COVID-19 are in a state of rapid change based on information released by regulatory bodies including the CDC and federal and state organizations. These policies and algorithms were followed during the patient's care in the ED.  This patient complains of increased shortness of breath in the setting of Covid infection; this involves an extensive number of treatment Options and is a complaint that carries with it a high risk of complications and Morbidity. The differential includes Covid, hypoxia, pneumonia, pneumothorax, PE, metabolic derangement  I ordered, reviewed and interpreted labs, which included CBC with mildly  elevated white blood cell count, normal hemoglobin and platelets, chemistry showing elevated glucose, D-dimer elevated, inflammatory markers elevated I ordered medication IV Decadron I ordered imaging studies which included chest x-ray and I independently    visualized and interpreted imaging which showed multifocal pneumonia Previous records obtained and reviewed in epic, including recent Covid testing at [facility] and discussions with primary care provider I consulted Dr. Tamala Julian [facility] hospital[facility] and discussed lab and imaging findings  Critical Interventions: Identification and management of Covid hypoxia with early initiation of steroids.  After the interventions stated above, I reevaluated the patient and found patient to be requiring 8 L of nasal cannula oxygen.  Will need admission to the hospital for further management of Covid and hypoxia.  Patient in agreement with plan.   Final Clinical Impression(s) / ED Diagnoses Final diagnoses:  Acute respiratory failure with hypoxia ([place])  Pneumonia due to COVID-19 virus    Rx / DC Orders ED Discharge Orders    None       Hayden Rasmussen, MD 04/26/20 [phone]

## 2020-04-26 NOTE — ED Notes (Signed)
Pt given peanutbutter crackers and diet coke

## 2020-04-27 DIAGNOSIS — N182 Chronic kidney disease, stage 2 (mild): Secondary | ICD-10-CM

## 2020-04-27 DIAGNOSIS — J9601 Acute respiratory failure with hypoxia: Secondary | ICD-10-CM

## 2020-04-27 DIAGNOSIS — E1122 Type 2 diabetes mellitus with diabetic chronic kidney disease: Secondary | ICD-10-CM

## 2020-04-27 DIAGNOSIS — I10 Essential (primary) hypertension: Secondary | ICD-10-CM

## 2020-04-27 LAB — COMPREHENSIVE METABOLIC PANEL
ALT: 69 U/L — ABNORMAL HIGH (ref 0–44)
AST: 28 U/L (ref 15–41)
Albumin: 2.7 g/dL — ABNORMAL LOW (ref 3.5–5.0)
Alkaline Phosphatase: 55 U/L (ref 38–126)
Anion gap: 12 (ref 5–15)
BUN: 21 mg/dL — ABNORMAL HIGH (ref 6–20)
CO2: 23 mmol/L (ref 22–32)
Calcium: 9.4 mg/dL (ref 8.9–10.3)
Chloride: 102 mmol/L (ref 98–111)
Creatinine, Ser: 0.96 mg/dL (ref 0.61–1.24)
GFR calc Af Amer: >60 mL/min (ref >60)
GFR calc non Af Amer: >60 mL/min (ref >60)
Glucose, Bld: 287 mg/dL — ABNORMAL HIGH (ref 70–99)
Potassium: 3.7 mmol/L (ref 3.5–5.1)
Sodium: 137 mmol/L (ref 135–145)
Total Bilirubin: 0.5 mg/dL (ref 0.3–1.2)
Total Protein: 6.1 g/dL — ABNORMAL LOW (ref 6.5–8.1)

## 2020-04-27 LAB — FERRITIN: Ferritin: 1206 ng/mL — ABNORMAL HIGH (ref 24–336)

## 2020-04-27 LAB — CBC WITH DIFFERENTIAL/PLATELET
Abs Immature Granulocytes: 0.03 10*3/uL (ref 0.00–0.07)
Basophils Absolute: 0 10*3/uL (ref 0.0–0.1)
Basophils Relative: 0 %
Eosinophils Absolute: 0 10*3/uL (ref 0.0–0.5)
Eosinophils Relative: 0 %
HCT: 46.6 % (ref 39.0–52.0)
Hemoglobin: 15.8 g/dL (ref 13.0–17.0)
Immature Granulocytes: 0 %
Lymphocytes Relative: 11 %
Lymphs Abs: 0.9 10*3/uL (ref 0.7–4.0)
MCH: 29.6 pg (ref 26.0–34.0)
MCHC: 33.9 g/dL (ref 30.0–36.0)
MCV: 87.4 fL (ref 80.0–100.0)
Monocytes Absolute: 0.5 10*3/uL (ref 0.1–1.0)
Monocytes Relative: 6 %
Neutro Abs: 7.2 10*3/uL (ref 1.7–7.7)
Neutrophils Relative %: 83 %
Platelets: 137 10*3/uL — ABNORMAL LOW (ref 150–400)
RBC: 5.33 MIL/uL (ref 4.22–5.81)
RDW: 11.5 % (ref 11.5–15.5)
WBC: 8.7 10*3/uL (ref 4.0–10.5)
nRBC: 0 % (ref 0.0–0.2)

## 2020-04-27 LAB — C-REACTIVE PROTEIN: CRP: 6.7 mg/dL — ABNORMAL HIGH (ref <1.0)

## 2020-04-27 LAB — D-DIMER, QUANTITATIVE: D-Dimer, Quant: 1.76 ug/mL-FEU — ABNORMAL HIGH (ref 0.00–0.50)

## 2020-04-27 LAB — GLUCOSE, CAPILLARY
Glucose-Capillary: 206 mg/dL — ABNORMAL HIGH (ref 70–99)
Glucose-Capillary: 222 mg/dL — ABNORMAL HIGH (ref 70–99)
Glucose-Capillary: 269 mg/dL — ABNORMAL HIGH (ref 70–99)
Glucose-Capillary: 228 mg/dL — ABNORMAL HIGH (ref 70–99)

## 2020-04-27 LAB — HEPATITIS B SURFACE ANTIGEN: Hepatitis B Surface Ag: NONREACTIVE

## 2020-04-27 LAB — ABO/RH: ABO/RH(D): A POS

## 2020-04-27 MED ORDER — SODIUM CHLORIDE 0.9 % IV SOLN: 100.0000 mg | Freq: Every day | INTRAVENOUS | Status: DC

## 2020-04-27 MED ORDER — ENOXAPARIN SODIUM 40 MG/0.4ML ~~LOC~~ SOLN
40.0000 mg | SUBCUTANEOUS | Status: DC
  Administered 2020-04-27 – 2020-04-28 (×2): 40 mg via SUBCUTANEOUS
  Filled 2020-04-27 (×2): qty 0.4

## 2020-04-27 MED ORDER — INSULIN ASPART 100 UNIT/ML ~~LOC~~ SOLN: 0.0000 [IU] | Freq: Three times a day (TID) | SUBCUTANEOUS | Status: DC

## 2020-04-27 MED ORDER — SODIUM CHLORIDE 0.9 % IV SOLN: 200.0000 mg | Freq: Once | INTRAVENOUS | Status: DC

## 2020-04-27 MED ORDER — LISINOPRIL 5 MG PO TABS
5.0000 mg | ORAL_TABLET | Freq: Every day | ORAL | Status: DC
  Administered 2020-04-27 – 2020-04-28 (×2): 5 mg via ORAL
  Filled 2020-04-27 (×2): qty 1

## 2020-04-27 MED ORDER — TRAMADOL HCL 50 MG PO TABS
50.0000 mg | ORAL_TABLET | Freq: Four times a day (QID) | ORAL | Status: DC | PRN
  Administered 2020-04-27: 50 mg via ORAL
  Filled 2020-04-27: qty 1

## 2020-04-27 MED ORDER — DEXAMETHASONE SODIUM PHOSPHATE 10 MG/ML IJ SOLN
6.0000 mg | INTRAMUSCULAR | Status: DC
  Administered 2020-04-27: 6 mg via INTRAVENOUS
  Filled 2020-04-27: qty 1

## 2020-04-27 MED ORDER — METFORMIN HCL ER 500 MG PO TB24
1000.0000 mg | ORAL_TABLET | Freq: Two times a day (BID) | ORAL | Status: DC
  Administered 2020-04-27: 1000 mg via ORAL
  Filled 2020-04-27: qty 2

## 2020-04-27 MED ORDER — HYDROCOD POLST-CPM POLST ER 10-8 MG/5ML PO SUER: 5.0000 mL | Freq: Two times a day (BID) | ORAL | Status: DC | PRN

## 2020-04-27 MED ORDER — ZINC SULFATE 220 (50 ZN) MG PO CAPS: 220.0000 mg | ORAL_CAPSULE | Freq: Every day | ORAL | Status: DC

## 2020-04-27 MED ORDER — INSULIN ASPART 100 UNIT/ML ~~LOC~~ SOLN
0.0000 [IU] | Freq: Every day | SUBCUTANEOUS | Status: DC
  Administered 2020-04-27: 2 [IU] via SUBCUTANEOUS

## 2020-04-27 MED ORDER — INSULIN GLARGINE 100 UNIT/ML ~~LOC~~ SOLN
12.0000 [IU] | Freq: Every day | SUBCUTANEOUS | Status: DC
  Administered 2020-04-27 – 2020-04-28 (×2): 12 [IU] via SUBCUTANEOUS
  Filled 2020-04-27 (×2): qty 0.12

## 2020-04-27 MED ORDER — ASCORBIC ACID 500 MG PO TABS: 500.0000 mg | ORAL_TABLET | Freq: Every day | ORAL | Status: DC

## 2020-04-27 MED ORDER — ASPIRIN EC 81 MG PO TBEC
81.0000 mg | DELAYED_RELEASE_TABLET | Freq: Every morning | ORAL | Status: DC
  Administered 2020-04-27 – 2020-04-28 (×2): 81 mg via ORAL
  Filled 2020-04-27 (×2): qty 1

## 2020-04-27 MED ORDER — GUAIFENESIN-DM 100-10 MG/5ML PO SYRP: 10.0000 mL | ORAL_SOLUTION | ORAL | Status: DC | PRN

## 2020-04-27 MED ORDER — ALBUTEROL SULFATE HFA 108 (90 BASE) MCG/ACT IN AERS: 2.0000 | INHALATION_SPRAY | Freq: Four times a day (QID) | RESPIRATORY_TRACT | Status: DC

## 2020-04-27 MED ORDER — INSULIN ASPART 100 UNIT/ML ~~LOC~~ SOLN
0.0000 [IU] | Freq: Three times a day (TID) | SUBCUTANEOUS | Status: DC
  Administered 2020-04-27: 7 [IU] via SUBCUTANEOUS
  Administered 2020-04-27: 11 [IU] via SUBCUTANEOUS
  Administered 2020-04-27: 7 [IU] via SUBCUTANEOUS
  Administered 2020-04-28: 11 [IU] via SUBCUTANEOUS
  Administered 2020-04-28: 7 [IU] via SUBCUTANEOUS

## 2020-04-27 MED ORDER — BENZONATATE 100 MG PO CAPS
200.0000 mg | ORAL_CAPSULE | Freq: Three times a day (TID) | ORAL | Status: DC
  Administered 2020-04-27 – 2020-04-28 (×4): 200 mg via ORAL
  Filled 2020-04-27 (×4): qty 2

## 2020-04-27 MED ORDER — SODIUM CHLORIDE 0.9 % IV SOLN: INTRAVENOUS | Status: DC

## 2020-04-27 NOTE — Progress Notes (Signed)
PROGRESS NOTE                                                                                                                                                                                                             Patient Demographics:    Gregory Jacobs, is a 49 y.o. male, DOB - [birth year], JOI:[id number]  Outpatient Primary MD for the patient is Unk Pinto, MD   Admit date - 04/26/2020   LOS - 1  Chief Complaint  Patient presents with  . Shortness of Breath  . COVID POSSITIVE AS OF 04/16/2020       Brief Narrative: Patient is a 49 y.o. male with PMHx of HTN, DM-tested positive for Covid on 7/29 (unvaccinated)-presented to the hospital with a 5-day history of fever, chills, myalgias and worsening shortness of breath-he was found to have COVID-19 pneumonia and admitted to the hospitalist service.  Significant Events: 7/29>> Covid positive (results available in care everywhere) 8/6>> diagnosed with bilateral pneumonia on x-ray-presented to [facility] left due to significant wait time. 8/8>> presented to [facility] with worsening cough/shortness of breath-hypoxic requiring O2 supplementation.  Pneumonia on x-ray.  Significant studies: 8/6>>Chest x-ray: Bilateral peripheral opacities consistent with COVID-19 pneumonia 8/8>> chest x-ray: Stable multifocal infiltrates.  COVID-19 medications: Steroids: 8/8>> Remdesivir:8/8>>  Antibiotics:  None  Microbiology data: 8/8 >>blood culture: No growth  Procedures: None  Consults: None  DVT prophylaxis: enoxaparin (LOVENOX) injection 40 mg Start: 04/27/20 0600    Subjective:    Gregory Jacobs today feels better-titrated off oxygen this morning.  Continues to have some coughing spells.   Assessment  & Plan :   Acute Hypoxic Resp Failure due to Covid 19 Viral pneumonia: Clinically improved-titrated off oxygen this morning-O2 sats stable on room air.  Plans are to  continue with steroids and remdesivir.  Follow inflammatory markers and clinical course.  However if clinical improvement continues-suspect patient could be discharged home on 8/10 to continue remdesivir infusion in the outpatient setting-we will reassess on 8/10.  Fever: afebrile O2 requirements:  SpO2: 100 % O2 Flow Rate (L/min): 3 L/min   COVID-19 Labs: Recent Labs    04/26/20 0834 04/27/20 0157  DDIMER 0.58* 1.76*  FERRITIN 1,063* 1,206*  LDH 251*  --   CRP 6.1* 6.7*    No results found for: BNP  Recent Labs  Lab 04/26/20  9381  PROCALCITON 0.13    Lab Results  Component Value Date   SARSCOV2NAA POSITIVE (A) 04/26/2020   [place] Not Detected 08/19/2019     Prone/Incentive Spirometry: encouraged  incentive spirometry use 3-4/hour.  HTN: BP stable-continue lisinopril.  DM-2 with uncontrolled hyperglycemia secondary to steroids: Hold Metformin-start 12 units of Lantus, resistant SSI and follow.  Recent Labs    04/26/20 1652 04/26/20 2122 04/27/20 0810  GLUCAP 315* 319* 206*    ABG: No results found for: PHART, PCO2ART, PO2ART, HCO3, TCO2, ACIDBASEDEF, O2SAT  Vent Settings: N/A  Condition - Stable  Family Communication  :  Spouse updated over the phone on 8/8  Code Status :  Full Code  Diet :  Diet Order            Diet heart healthy/carb modified Room service appropriate? Yes; Fluid consistency: Thin  Diet effective now                  Disposition Plan  :   Status is: Inpatient  Remains inpatient appropriate because:Inpatient level of care appropriate due to severity of illness  Dispo: The patient is from: Home              Anticipated d/c is to: Home              Anticipated d/c date is: 1 days-potential discharge on 8/10 if clinical improvement continues               Patient currently is not medically stable to d/c.   Barriers to discharge: Hypoxia requiring O2 supplementation/complete 5 days of IV Remdesivir  Antimicorbials  :      Anti-infectives (From admission, onward)   Start     Dose/Rate Route Frequency Ordered Stop   04/27/20 1000  remdesivir 100 mg in sodium chloride 0.9 % 100 mL IVPB  Status:  Discontinued       "Followed by" Linked Group Details   100 mg 200 mL/hr over 30 Minutes Intravenous Daily 04/26/20 0949 04/26/20 0955   04/27/20 1000  remdesivir 100 mg in sodium chloride 0.9 % 100 mL IVPB     Discontinue     100 mg 200 mL/hr over 30 Minutes Intravenous Daily 04/26/20 0955 05/01/20 0959   04/27/20 1000  remdesivir 100 mg in sodium chloride 0.9 % 100 mL IVPB  Status:  Discontinued       "Followed by" Linked Group Details   100 mg 200 mL/hr over 30 Minutes Intravenous Daily 04/27/20 0024 04/27/20 0037   04/27/20 0030  remdesivir 200 mg in sodium chloride 0.9% 250 mL IVPB  Status:  Discontinued       "Followed by" Linked Group Details   200 mg 580 mL/hr over 30 Minutes Intravenous Once 04/27/20 0024 04/27/20 0037   04/26/20 1030  remdesivir 100 mg in sodium chloride 0.9 % 100 mL IVPB  Status:  Discontinued       "And" Linked Group Details   100 mg 200 mL/hr over 30 Minutes Intravenous Daily 04/26/20 0955 04/26/20 2214   04/26/20 1000  remdesivir 200 mg in sodium chloride 0.9% 250 mL IVPB  Status:  Discontinued       "Followed by" Linked Group Details   200 mg 580 mL/hr over 30 Minutes Intravenous Once 04/26/20 0949 04/26/20 0955   04/26/20 1000  remdesivir 100 mg in sodium chloride 0.9 % 100 mL IVPB  Status:  Discontinued       "And" Linked Group Details  100 mg 200 mL/hr over 30 Minutes Intravenous Daily 04/26/20 0955 04/26/20 2214      Inpatient Medications  Scheduled Meds: . albuterol  2 puff Inhalation Q6H  . vitamin C  500 mg Oral Daily  . aspirin EC  81 mg Oral q AM  . dexamethasone (DECADRON) injection  6 mg Intravenous Q24H  . enoxaparin (LOVENOX) injection  40 mg Subcutaneous Q24H  . famotidine  20 mg Oral BID  . insulin aspart  0-20 Units Subcutaneous TID WC  . insulin  aspart  0-5 Units Subcutaneous QHS  . insulin glargine  12 Units Subcutaneous Daily  . lisinopril  5 mg Oral Daily  . methylPREDNISolone (SOLU-MEDROL) injection  0.5 mg/kg Intravenous Q12H  . zinc sulfate  220 mg Oral Daily   Continuous Infusions: . sodium chloride Stopped (04/26/20 1316)  . sodium chloride 10 mL/hr at 04/27/20 0741  . remdesivir 100 mg in NS 100 mL 100 mg (04/27/20 0933)   PRN Meds:.sodium chloride, chlorpheniramine-HYDROcodone, guaiFENesin-dextromethorphan, traMADol   Time Spent in minutes  25  See all Orders from today for further details   Oren Binet M.D on 04/27/2020 at 10:50 AM  To page go to [email or web address] - use universal password  [facility] -  Office  [phone]    Objective:   Vitals:   04/27/20 0500 04/27/20 0537 04/27/20 0929 04/27/20 1042  BP:  118/70 116/82   Pulse:  (!) 58  78  Resp:  20  19  Temp:      TempSrc:      SpO2: 96%   100%  Weight:      Height:        Wt Readings from Last 3 Encounters:  04/26/20 98.3 kg  04/15/20 (!) 96.6 kg  01/15/20 101.6 kg     Intake/Output Summary (Last 24 hours) at 04/27/2020 1050 Last data filed at 04/27/2020 0900 Gross per 24 hour  Intake 984.66 ml  Output 2410 ml  Net -1425.34 ml     Physical Exam Gen Exam:Alert awake-not in any distress HEENT:atraumatic, normocephalic Chest: B/L clear to auscultation anteriorly CVS:S1S2 regular Abdomen:soft non tender, non distended Extremities:no edema Neurology: Non focal Skin: no rash   Data Review:    CBC Recent Labs  Lab 04/26/20 0834 04/27/20 0157  WBC 13.5* 8.7  HGB 16.4 15.8  HCT 46.8 46.6  PLT 161 137*  MCV 85.7 87.4  MCH 30.0 29.6  MCHC 35.0 33.9  RDW 11.6 11.5  LYMPHSABS 1.3 0.9  MONOABS 0.5 0.5  EOSABS 0.0 0.0  BASOSABS 0.0 0.0    Chemistries  Recent Labs  Lab 04/26/20 0834 04/27/20 0157  NA 134* 137  K 3.9 3.7  CL 99 102  CO2 23 23  GLUCOSE 194* 287*  BUN 17 21*  CREATININE 0.92 0.96  CALCIUM  8.8* 9.4  AST 36 28  ALT 84* 69*  ALKPHOS 55 55  BILITOT 0.8 0.5   ------------------------------------------------------------------------------------------------------------------ Recent Labs    04/26/20 0834  TRIG 112    Lab Results  Component Value Date   HGBA1C 6.2 (H) 04/15/2020   ------------------------------------------------------------------------------------------------------------------ No results for input(s): TSH, T4TOTAL, T3FREE, THYROIDAB in the last 72 hours.  Invalid input(s): FREET3 ------------------------------------------------------------------------------------------------------------------ Recent Labs    04/26/20 0834 04/27/20 0157  FERRITIN 1,063* 1,206*    Coagulation profile No results for input(s): INR, PROTIME in the last 168 hours.  Recent Labs    04/26/20 0834 04/27/20 0157  DDIMER 0.58* 1.76*    Cardiac Enzymes No results  for input(s): CKMB, TROPONINI, MYOGLOBIN in the last 168 hours.  Invalid input(s): CK ------------------------------------------------------------------------------------------------------------------ No results found for: BNP  Micro Results Recent Results (from the past 240 hour(s))  Blood Culture (routine x 2)     Status: None (Preliminary result)   Collection Time: 04/26/20  8:30 AM   Specimen: BLOOD  Result Value Ref Range Status   Specimen Description   Final    BLOOD RIGHT ANTECUBITAL Performed at [facility], [place] [address] [place], [place] [address]    Special Requests   Final    BOTTLES DRAWN AEROBIC AND ANAEROBIC Blood Culture adequate volume Performed at [facility], [address] [place], [place] [address]    Culture   Final    NO GROWTH < 24 HOURS Performed at [facility], [place] [address], [place], [place] [address]    Report Status PENDING  Incomplete  SARS Coronavirus 2 by RT PCR (hospital order, performed in [facility] hospital lab) Nasopharyngeal  Nasopharyngeal Swab     Status: Abnormal   Collection Time: 04/26/20  8:58 AM   Specimen: Nasopharyngeal Swab  Result Value Ref Range Status   SARS Coronavirus 2 POSITIVE (A) NEGATIVE Final    Comment: RESULT CALLED TO, READ BACK BY AND VERIFIED WITH: CALLED TO S.GOUGE AT 1008 ON [id number] BY SROY (NOTE) SARS-CoV-2 target nucleic acids are DETECTED  SARS-CoV-2 RNA is generally detectable in upper respiratory specimens  during the acute phase of infection.  Positive results are indicative  of the presence of the identified virus, but do not rule out bacterial infection or co-infection with other pathogens not detected by the test.  Clinical correlation with patient history and  other diagnostic information is necessary to determine patient infection status.  The expected result is negative.  Fact Sheet for Patients:   StrictlyIdeas.no   Fact Sheet for Healthcare Providers:   BankingDealers.co.za    This test is not yet approved or cleared by the [place] FDA and  has been authorized for detection and/or diagnosis of SARS-CoV-2 by FDA under an Emergency Use Authorization (EUA).  This EUA will remain in effect (meanin g this test can be used) for the duration of  the COVID-19 declaration under Section 564(b)(1) of the Act, 21 U.S.C. section 360-bbb-3(b)(1), unless the authorization is terminated or revoked sooner.  Performed at [facility], [address] [place], [place] [address]   Blood Culture (routine x 2)     Status: None (Preliminary result)   Collection Time: 04/26/20  8:59 AM   Specimen: BLOOD  Result Value Ref Range Status   Specimen Description   Final    BLOOD LEFT ANTECUBITAL Performed at [facility], [place] [address] [place], [place] [address]    Special Requests   Final    BOTTLES DRAWN AEROBIC ONLY Blood Culture results may not be optimal due to an inadequate volume of blood received in culture  bottles Performed at [facility], [address] [place], [place] [address]    Culture   Final    NO GROWTH < 24 HOURS Performed at [facility], [place] [address], [place],  [address]    Report Status PENDING  Incomplete    Radiology Reports DG Chest 2 View  Result Date: 04/24/2020 CLINICAL DATA:  COVID EXAM: CHEST - 2 VIEW COMPARISON:  December 21, 2018 FINDINGS: The cardiomediastinal silhouette is normal in contour. No pleural effusion.  No pneumothorax. There are bilateral peripheral predominant heterogeneous opacities. Status post cholecystectomy. No acute osseous abnormality noted. IMPRESSION: Bilateral peripheral predominant heterogeneous opacities consistent with COVID-19 pneumonia. Electronically Signed   By: Valentino Saxon MD   On: 04/24/2020 14:40   DG Chest Port 1 View  Result Date: 04/26/2020 CLINICAL DATA:  Positive test for Covid on 04/16/2020. recently diagnosed with pneumonia. Shortness of breath. EXAM: PORTABLE CHEST 1 VIEW COMPARISON:  04/24/2020 FINDINGS: Better lung expansion compared with most recent exam. There are persistent patchy airspace filling opacities along the periphery of the LEFT lung. There is persistent peripheral patchy opacity in the RIGHT mid to LOWER lung zone. No pulmonary edema or effusions. IMPRESSION: Stable multifocal infiltrates. Electronically Signed   By: Nolon Nations M.D.   On: 04/26/2020 09:37

## 2020-04-27 NOTE — Progress Notes (Signed)
SATURATION QUALIFICATIONS: (This note is used to comply with regulatory documentation for home oxygen)  Patient Saturations on Room Air at Rest = 100%  Patient Saturations on Room Air while Ambulating = 97%    Please briefly explain why patient needs home oxygen: Patient ambulated down the hall 50 ft, without any complaints of SOB or discomfort.

## 2020-04-27 NOTE — Progress Notes (Signed)
Heart rate has been brady. Low 50s. Patient denies any symptoms. Vital signs are stable. Dr. Tonie Griffith notified. Will continue to assess.

## 2020-04-28 ENCOUNTER — Other Ambulatory Visit: Payer: Self-pay | Admitting: Internal Medicine

## 2020-04-28 ENCOUNTER — Other Ambulatory Visit: Payer: Self-pay | Admitting: Adult Health

## 2020-04-28 DIAGNOSIS — J9601 Acute respiratory failure with hypoxia: Secondary | ICD-10-CM

## 2020-04-28 DIAGNOSIS — E119 Type 2 diabetes mellitus without complications: Secondary | ICD-10-CM

## 2020-04-28 DIAGNOSIS — Z794 Long term (current) use of insulin: Secondary | ICD-10-CM

## 2020-04-28 LAB — COMPREHENSIVE METABOLIC PANEL
ALT: 72 U/L — ABNORMAL HIGH (ref 0–44)
AST: 35 U/L (ref 15–41)
Albumin: 2.5 g/dL — ABNORMAL LOW (ref 3.5–5.0)
Alkaline Phosphatase: 45 U/L (ref 38–126)
Anion gap: 15 (ref 5–15)
BUN: 21 mg/dL — ABNORMAL HIGH (ref 6–20)
CO2: 23 mmol/L (ref 22–32)
Calcium: 9.3 mg/dL (ref 8.9–10.3)
Chloride: 100 mmol/L (ref 98–111)
Creatinine, Ser: 0.87 mg/dL (ref 0.61–1.24)
GFR calc Af Amer: >60 mL/min (ref >60)
GFR calc non Af Amer: >60 mL/min (ref >60)
Glucose, Bld: 275 mg/dL — ABNORMAL HIGH (ref 70–99)
Potassium: 4.7 mmol/L (ref 3.5–5.1)
Sodium: 138 mmol/L (ref 135–145)
Total Bilirubin: 0.9 mg/dL (ref 0.3–1.2)
Total Protein: 6.1 g/dL — ABNORMAL LOW (ref 6.5–8.1)

## 2020-04-28 LAB — CBC WITH DIFFERENTIAL/PLATELET
Abs Immature Granulocytes: 0.04 10*3/uL (ref 0.00–0.07)
Basophils Absolute: 0 10*3/uL (ref 0.0–0.1)
Basophils Relative: 0 %
Eosinophils Absolute: 0 10*3/uL (ref 0.0–0.5)
Eosinophils Relative: 0 %
HCT: 43.9 % (ref 39.0–52.0)
Hemoglobin: 14.9 g/dL (ref 13.0–17.0)
Immature Granulocytes: 1 %
Lymphocytes Relative: 10 %
Lymphs Abs: 0.7 10*3/uL (ref 0.7–4.0)
MCH: 29.4 pg (ref 26.0–34.0)
MCHC: 33.9 g/dL (ref 30.0–36.0)
MCV: 86.6 fL (ref 80.0–100.0)
Monocytes Absolute: 0.2 10*3/uL (ref 0.1–1.0)
Monocytes Relative: 3 %
Neutro Abs: 6.4 10*3/uL (ref 1.7–7.7)
Neutrophils Relative %: 86 %
Platelets: 163 10*3/uL (ref 150–400)
RBC: 5.07 MIL/uL (ref 4.22–5.81)
RDW: 11.5 % (ref 11.5–15.5)
WBC: 7.4 10*3/uL (ref 4.0–10.5)
nRBC: 0 % (ref 0.0–0.2)

## 2020-04-28 LAB — C-REACTIVE PROTEIN: CRP: 2.2 mg/dL — ABNORMAL HIGH (ref <1.0)

## 2020-04-28 LAB — GLUCOSE, CAPILLARY
Glucose-Capillary: 243 mg/dL — ABNORMAL HIGH (ref 70–99)
Glucose-Capillary: 276 mg/dL — ABNORMAL HIGH (ref 70–99)

## 2020-04-28 LAB — FERRITIN: Ferritin: 943 ng/mL — ABNORMAL HIGH (ref 24–336)

## 2020-04-28 LAB — D-DIMER, QUANTITATIVE: D-Dimer, Quant: 0.41 ug/mL-FEU (ref 0.00–0.50)

## 2020-04-28 MED ORDER — ALBUTEROL SULFATE HFA 108 (90 BASE) MCG/ACT IN AERS: 2.0000 | INHALATION_SPRAY | RESPIRATORY_TRACT | 0 refills | Status: DC | PRN

## 2020-04-28 MED ORDER — BENZONATATE 200 MG PO CAPS: 200.0000 mg | ORAL_CAPSULE | Freq: Three times a day (TID) | ORAL | 0 refills | Status: DC

## 2020-04-28 MED ORDER — HYDROCOD POLST-CPM POLST ER 10-8 MG/5ML PO SUER: 5.0000 mL | Freq: Two times a day (BID) | ORAL | 0 refills | Status: DC | PRN

## 2020-04-28 MED ORDER — PREDNISONE 10 MG PO TABS
ORAL_TABLET | ORAL | 0 refills | Status: DC
Start: 2020-04-28 — End: 2020-05-03

## 2020-04-28 MED ORDER — NOVOLIN 70/30 (70-30) 100 UNIT/ML ~~LOC~~ SUSP: SUBCUTANEOUS | 3 refills | Status: DC

## 2020-04-28 NOTE — Plan of Care (Signed)
  Problem: Respiratory: Goal: Will maintain a patent airway Outcome: Progressing Goal: Complications related to the disease process, condition or treatment will be avoided or minimized Outcome: Progressing   Problem: Education: Goal: Knowledge of General Education information will improve Description: Including pain rating scale, medication(s)/side effects and non-pharmacologic comfort measures Outcome: Progressing   Problem: Health Behavior/Discharge Planning: Goal: Ability to manage health-related needs will improve Outcome: Progressing   Problem: Clinical Measurements: Goal: Will remain free from infection Outcome: Progressing Goal: Diagnostic test results will improve Outcome: Progressing Goal: Respiratory complications will improve Outcome: Progressing Goal: Cardiovascular complication will be avoided Outcome: Progressing

## 2020-04-28 NOTE — Progress Notes (Signed)
Gregory Jacobs to be D/C'd Home per MD order.  Discussed with the patient and all questions fully answered.  VSS, Skin clean, dry and intact without evidence of skin break down, no evidence of skin tears noted. IV catheter discontinued intact. Site without signs and symptoms of complications. Dressing and pressure applied.  An After Visit Summary was printed and given to the patient. Patient received prescription.  D/c education completed with patient/family including follow up instructions, medication list, d/c activities limitations if indicated, with other d/c instructions as indicated by MD - patient able to verbalize understanding, all questions fully answered.   Patient instructed to return to ED, call 911, or call MD for any changes in condition.   Patient escorted via [place], and D/C home via private auto.   Lorenza Evangelist [facility] 04/28/2020 12:14 PM

## 2020-04-28 NOTE — Discharge Instructions (Signed)
Person Under Monitoring Name: Gregory Jacobs  Location: [place] [address]   Infection Prevention Recommendations for Individuals Confirmed to have, or Being Evaluated for, 2019 Novel Coronavirus (COVID-19) Infection Who Receive Care at Home  Individuals who are confirmed to have, or are being evaluated for, COVID-19 should follow the prevention steps below until a healthcare provider or local or state health department says they can return to normal activities.  Stay home except to get medical care You should restrict activities outside your home, except for getting medical care. Do not go to work, school, or public areas, and do not use public transportation or taxis.  Call ahead before visiting your doctor Before your medical appointment, call the healthcare provider and tell them that you have, or are being evaluated for, COVID-19 infection. This will help the healthcare provider's office take steps to keep other people from getting infected. Ask your healthcare provider to call the local or state health department.  Monitor your symptoms Seek prompt medical attention if your illness is worsening (e.g., difficulty breathing). Before going to your medical appointment, call the healthcare provider and tell them that you have, or are being evaluated for, COVID-19 infection. Ask your healthcare provider to call the local or state health department.  Wear a facemask You should wear a facemask that covers your nose and mouth when you are in the same room with other people and when you visit a healthcare provider. People who live with or visit you should also wear a facemask while they are in the same room with you.  Separate yourself from other people in your home As much as possible, you should stay in a different room from other people in your home. Also, you should use a separate bathroom, if available.  Avoid sharing household items You should not  share dishes, drinking glasses, cups, eating utensils, towels, bedding, or other items with other people in your home. After using these items, you should wash them thoroughly with soap and water.  Cover your coughs and sneezes Cover your mouth and nose with a tissue when you cough or sneeze, or you can cough or sneeze into your sleeve. Throw used tissues in a lined trash can, and immediately wash your hands with soap and water for at least 20 seconds or use an alcohol-based hand rub.  Wash your [facility] your hands often and thoroughly with soap and water for at least 20 seconds. You can use an alcohol-based hand sanitizer if soap and water are not available and if your hands are not visibly dirty. Avoid touching your eyes, nose, and mouth with unwashed hands.   Prevention Steps for Caregivers and Household Members of Individuals Confirmed to have, or Being Evaluated for, COVID-19 Infection Being Cared for in the Home  If you live with, or provide care at home for, a person confirmed to have, or being evaluated for, COVID-19 infection please follow these guidelines to prevent infection:  Follow healthcare provider's instructions Make sure that you understand and can help the patient follow any healthcare provider instructions for all care.  Provide for the patient's basic needs You should help the patient with basic needs in the home and provide support for getting groceries, prescriptions, and other personal needs.  Monitor the patient's symptoms If they are getting sicker, call his or her medical provider and tell them that the patient has, or is being evaluated for, COVID-19 infection. This will help the healthcare provider's office take  steps to keep other people from getting infected. Ask the healthcare provider to call the local or state health department.  Limit the number of people who have contact with the patient  If possible, have only one caregiver for the  patient.  Other household members should stay in another home or place of residence. If this is not possible, they should stay  in another room, or be separated from the patient as much as possible. Use a separate bathroom, if available.  Restrict visitors who do not have an essential need to be in the home.  Keep older adults, very young children, and other sick people away from the patient Keep older adults, very young children, and those who have compromised immune systems or chronic health conditions away from the patient. This includes people with chronic heart, lung, or kidney conditions, diabetes, and cancer.  Ensure good ventilation Make sure that shared spaces in the home have good air flow, such as from an air conditioner or an opened window, weather permitting.  Wash your hands often  Wash your hands often and thoroughly with soap and water for at least 20 seconds. You can use an alcohol based hand sanitizer if soap and water are not available and if your hands are not visibly dirty.  Avoid touching your eyes, nose, and mouth with unwashed hands.  Use disposable paper towels to dry your hands. If not available, use dedicated cloth towels and replace them when they become wet.  Wear a facemask and gloves  Wear a disposable facemask at all times in the room and gloves when you touch or have contact with the patient's blood, body fluids, and/or secretions or excretions, such as sweat, saliva, sputum, nasal mucus, vomit, urine, or feces.  Ensure the mask fits over your nose and mouth tightly, and do not touch it during use.  Throw out disposable facemasks and gloves after using them. Do not reuse.  Wash your hands immediately after removing your facemask and gloves.  If your personal clothing becomes contaminated, carefully remove clothing and launder. Wash your hands after handling contaminated clothing.  Place all used disposable facemasks, gloves, and other waste in a lined  container before disposing them with other household waste.  Remove gloves and wash your hands immediately after handling these items.  Do not share dishes, glasses, or other household items with the patient  Avoid sharing household items. You should not share dishes, drinking glasses, cups, eating utensils, towels, bedding, or other items with a patient who is confirmed to have, or being evaluated for, COVID-19 infection.  After the person uses these items, you should wash them thoroughly with soap and water.  Wash laundry thoroughly  Immediately remove and wash clothes or bedding that have blood, body fluids, and/or secretions or excretions, such as sweat, saliva, sputum, nasal mucus, vomit, urine, or feces, on them.  Wear gloves when handling laundry from the patient.  Read and follow directions on labels of laundry or clothing items and detergent. In general, wash and dry with the warmest temperatures recommended on the label.  Clean all areas the individual has used often  Clean all touchable surfaces, such as counters, tabletops, doorknobs, bathroom fixtures, toilets, phones, keyboards, tablets, and bedside tables, every day. Also, clean any surfaces that may have blood, body fluids, and/or secretions or excretions on them.  Wear gloves when cleaning surfaces the patient has come in contact with.  Use a diluted bleach solution (e.g., dilute bleach with 1 part bleach  and 10 parts water) or a household disinfectant with a label that says EPA-registered for coronaviruses. To make a bleach solution at home, add 1 tablespoon of bleach to 1 quart (4 cups) of water. For a larger supply, add  cup of bleach to 1 gallon (16 cups) of water.  Read labels of cleaning products and follow recommendations provided on product labels. Labels contain instructions for safe and effective use of the cleaning product including precautions you should take when applying the product, such as wearing gloves or  eye protection and making sure you have good ventilation during use of the product.  Remove gloves and wash hands immediately after cleaning.  Monitor yourself for signs and symptoms of illness Caregivers and household members are considered close contacts, should monitor their health, and will be asked to limit movement outside of the home to the extent possible. Follow the monitoring steps for close contacts listed on the symptom monitoring form.   ? If you have additional questions, contact your local health department or call the epidemiologist on call at [phone] (available 24/7). ? This guidance is subject to change. For the most up-to-date guidance from [facility], please refer to their website: [email or web address]       Patient scheduled for outpatient Remdesivir infusions at 11am on Wednesday 8/11 and Thursday 8/12 at [facility]. Please inform the patient to park at [place], as staff will be escorting the patient through the [place] entrance of the hospital.    There is a wave flag banner located near the entrance on [address]. Turn into this entrance and immediately turn left and park in 1 of the 5 designated Covid Infusion Parking spots. There is a phone number on the sign, please call and let the staff know what spot you are in and we will come out and get you. For questions call [phone].  Thanks.   * If patient is getting dropped off for their appointment they may pull up to the main entrance of [facility] hospital. Please instruct the patient to wait in their car, call [phone] to inform staff they have arrived, staff will come to escort the patient through the hospital.

## 2020-04-28 NOTE — Discharge Planning (Deleted)
PATIENT DETAILS Name: Gregory Jacobs Age: 49 y.o. Sex: male Date of Birth: 1970-10-05 MRN: [id number]. Admitting Physician: Eben Burow, MD [id number], Gwyndolyn Saxon, MD  Admit Date: 04/26/2020 Discharge date: 04/28/2020  Recommendations for Outpatient Follow-up:  1. Follow up with PCP in 1-2 weeks 2. Please obtain CMP/CBC in one week 3. Repeat Chest Xray in 4-6 week  Admitted From:  Home  Disposition: Jay: No  Equipment/Devices: None  Discharge Condition: Stable  CODE STATUS: FULL CODE  Diet recommendation:  Diet Order            Diet - low sodium heart healthy           Diet Carb Modified           Diet heart healthy/carb modified Room service appropriate? Yes; Fluid consistency: Thin  Diet effective now                  Brief Narrative: Patient is a 49 y.o. male with PMHx of HTN, DM-tested positive for Covid on 7/29 (unvaccinated)-presented to the hospital with a 5-day history of fever, chills, myalgias and worsening shortness of breath-he was found to have COVID-19 pneumonia and admitted to the hospitalist service.  Significant Events: 7/29>> Covid positive (results available in care everywhere) 8/6>> diagnosed with bilateral pneumonia on x-ray-presented to [facility] left due to significant wait time. 8/8>> presented to [facility] with worsening cough/shortness of breath-hypoxic requiring O2 supplementation.  Pneumonia on x-ray.  Significant studies: 8/6>>Chest x-ray: Bilateral peripheral opacities consistent with COVID-19 pneumonia 8/8>> chest x-ray: Stable multifocal infiltrates.  COVID-19 medications: Steroids: 8/8>> Remdesivir:8/8>>  Antibiotics:  None  Microbiology data: 8/8 >>blood culture: No growth  Procedures: None  Consults: None   Brief Hospital Course: Acute Hypoxic Resp Failure due to Covid 19 Viral pneumonia: Clinically improved-titrated off oxygen yesterday-O2 sats have remained stable overnight-he is  still on room air.  Inflammatory markers have down trended.  RN ambulated the patient yesterday-does not require oxygen.  He will complete 2 more doses of remdesivir in the infusion center-he will be on tapering prednisone for a few more days.  Stable for discharge with close follow-up with PCP.  COVID-19 Labs:  Recent Labs    04/26/20 0834 04/27/20 0157 04/28/20 0710  DDIMER 0.58* 1.76* 0.41  FERRITIN 1,063* 1,206* 943*  LDH 251*  --   --   CRP 6.1* 6.7* 2.2*    Lab Results  Component Value Date   SARSCOV2NAA POSITIVE (A) 04/26/2020   [place] Not Detected 08/19/2019     HTN: BP stable-continue lisinopril.  DM-2 with uncontrolled hyperglycemia secondary to steroids:  Resume Metformin on discharge-was on sliding scale insulin and Lantus as he was on higher doses of steroids.  Suspect this steroids get tapered down-his sugars will improve over the next few days.    Discharge Diagnoses:  Principal Problem:   Pneumonia due to COVID-19 virus Active Problems:   Essential hypertension   Hyperlipidemia associated with type 2 diabetes mellitus (HCC)   Type 2 diabetes mellitus with kidney complication [facility])   Hypoxia   Discharge Instructions:    Person Under Monitoring Name: Gregory Jacobs  Location: [place] [address]   Infection Prevention Recommendations for Individuals Confirmed to have, or Being Evaluated for, 2019 Novel Coronavirus (COVID-19) Infection Who Receive Care at Home  Individuals who are confirmed to have, or are being evaluated for, COVID-19 should follow the prevention steps below until a healthcare provider or local or state health  department says they can return to normal activities.  Stay home except to get medical care You should restrict activities outside your home, except for getting medical care. Do not go to work, school, or public areas, and do not use public transportation or taxis.  Call ahead before visiting  your doctor Before your medical appointment, call the healthcare provider and tell them that you have, or are being evaluated for, COVID-19 infection. This will help the healthcare provider's office take steps to keep other people from getting infected. Ask your healthcare provider to call the local or state health department.  Monitor your symptoms Seek prompt medical attention if your illness is worsening (e.g., difficulty breathing). Before going to your medical appointment, call the healthcare provider and tell them that you have, or are being evaluated for, COVID-19 infection. Ask your healthcare provider to call the local or state health department.  Wear a facemask You should wear a facemask that covers your nose and mouth when you are in the same room with other people and when you visit a healthcare provider. People who live with or visit you should also wear a facemask while they are in the same room with you.  Separate yourself from other people in your home As much as possible, you should stay in a different room from other people in your home. Also, you should use a separate bathroom, if available.  Avoid sharing household items You should not share dishes, drinking glasses, cups, eating utensils, towels, bedding, or other items with other people in your home. After using these items, you should wash them thoroughly with soap and water.  Cover your coughs and sneezes Cover your mouth and nose with a tissue when you cough or sneeze, or you can cough or sneeze into your sleeve. Throw used tissues in a lined trash can, and immediately wash your hands with soap and water for at least 20 seconds or use an alcohol-based hand rub.  Wash your [facility] your hands often and thoroughly with soap and water for at least 20 seconds. You can use an alcohol-based hand sanitizer if soap and water are not available and if your hands are not visibly dirty. Avoid touching your eyes, nose,  and mouth with unwashed hands.   Prevention Steps for Caregivers and Household Members of Individuals Confirmed to have, or Being Evaluated for, COVID-19 Infection Being Cared for in the Home  If you live with, or provide care at home for, a person confirmed to have, or being evaluated for, COVID-19 infection please follow these guidelines to prevent infection:  Follow healthcare provider's instructions Make sure that you understand and can help the patient follow any healthcare provider instructions for all care.  Provide for the patient's basic needs You should help the patient with basic needs in the home and provide support for getting groceries, prescriptions, and other personal needs.  Monitor the patient's symptoms If they are getting sicker, call his or her medical provider and tell them that the patient has, or is being evaluated for, COVID-19 infection. This will help the healthcare provider's office take steps to keep other people from getting infected. Ask the healthcare provider to call the local or state health department.  Limit the number of people who have contact with the patient  If possible, have only one caregiver for the patient.  Other household members should stay in another home or place of residence. If this is not possible, they should stay  in another room,  or be separated from the patient as much as possible. Use a separate bathroom, if available.  Restrict visitors who do not have an essential need to be in the home.  Keep older adults, very young children, and other sick people away from the patient Keep older adults, very young children, and those who have compromised immune systems or chronic health conditions away from the patient. This includes people with chronic heart, lung, or kidney conditions, diabetes, and cancer.  Ensure good ventilation Make sure that shared spaces in the home have good air flow, such as from an air conditioner or an  opened window, weather permitting.  Wash your hands often  Wash your hands often and thoroughly with soap and water for at least 20 seconds. You can use an alcohol based hand sanitizer if soap and water are not available and if your hands are not visibly dirty.  Avoid touching your eyes, nose, and mouth with unwashed hands.  Use disposable paper towels to dry your hands. If not available, use dedicated cloth towels and replace them when they become wet.  Wear a facemask and gloves  Wear a disposable facemask at all times in the room and gloves when you touch or have contact with the patient's blood, body fluids, and/or secretions or excretions, such as sweat, saliva, sputum, nasal mucus, vomit, urine, or feces.  Ensure the mask fits over your nose and mouth tightly, and do not touch it during use.  Throw out disposable facemasks and gloves after using them. Do not reuse.  Wash your hands immediately after removing your facemask and gloves.  If your personal clothing becomes contaminated, carefully remove clothing and launder. Wash your hands after handling contaminated clothing.  Place all used disposable facemasks, gloves, and other waste in a lined container before disposing them with other household waste.  Remove gloves and wash your hands immediately after handling these items.  Do not share dishes, glasses, or other household items with the patient  Avoid sharing household items. You should not share dishes, drinking glasses, cups, eating utensils, towels, bedding, or other items with a patient who is confirmed to have, or being evaluated for, COVID-19 infection.  After the person uses these items, you should wash them thoroughly with soap and water.  Wash laundry thoroughly  Immediately remove and wash clothes or bedding that have blood, body fluids, and/or secretions or excretions, such as sweat, saliva, sputum, nasal mucus, vomit, urine, or feces, on them.  Wear gloves  when handling laundry from the patient.  Read and follow directions on labels of laundry or clothing items and detergent. In general, wash and dry with the warmest temperatures recommended on the label.  Clean all areas the individual has used often  Clean all touchable surfaces, such as counters, tabletops, doorknobs, bathroom fixtures, toilets, phones, keyboards, tablets, and bedside tables, every day. Also, clean any surfaces that may have blood, body fluids, and/or secretions or excretions on them.  Wear gloves when cleaning surfaces the patient has come in contact with.  Use a diluted bleach solution (e.g., dilute bleach with 1 part bleach and 10 parts water) or a household disinfectant with a label that says EPA-registered for coronaviruses. To make a bleach solution at home, add 1 tablespoon of bleach to 1 quart (4 cups) of water. For a larger supply, add  cup of bleach to 1 gallon (16 cups) of water.  Read labels of cleaning products and follow recommendations provided on product labels. Labels contain instructions  for safe and effective use of the cleaning product including precautions you should take when applying the product, such as wearing gloves or eye protection and making sure you have good ventilation during use of the product.  Remove gloves and wash hands immediately after cleaning.  Monitor yourself for signs and symptoms of illness Caregivers and household members are considered close contacts, should monitor their health, and will be asked to limit movement outside of the home to the extent possible. Follow the monitoring steps for close contacts listed on the symptom monitoring form.   ? If you have additional questions, contact your local health department or call the epidemiologist on call at [phone] (available 24/7). ? This guidance is subject to change. For the most up-to-date guidance from CDC, please refer to their  website: YouBlogs.pl    Activity:  As tolerated   Discharge Instructions    Call MD for:  difficulty breathing, headache or visual disturbances   Complete by: As directed    Call MD for:  extreme fatigue   Complete by: As directed    Call MD for:  persistant dizziness or light-headedness   Complete by: As directed    Call MD for:  persistant nausea and vomiting   Complete by: As directed    Diet - low sodium heart healthy   Complete by: As directed    Diet Carb Modified   Complete by: As directed    Discharge instructions   Complete by: As directed    Follow with Primary MD  Unk Pinto, MD in 1-2 weeks  Please get a complete blood count and chemistry panel checked by your Primary MD at your next visit, and again as instructed by your Primary MD.  Get Medicines reviewed and adjusted: Please take all your medications with you for your next visit with your Primary MD  Laboratory/radiological data: Please request your Primary MD to go over all hospital tests and procedure/radiological results at the follow up, please ask your Primary MD to get all Hospital records sent to his/her office.  In some cases, they will be blood work, cultures and biopsy results pending at the time of your discharge. Please request that your primary care M.D. follows up on these results.  Also Note the following: If you experience worsening of your admission symptoms, develop shortness of breath, life threatening emergency, suicidal or homicidal thoughts you must seek medical attention immediately by calling 911 or calling your MD immediately  if symptoms less severe.  You must read complete instructions/literature along with all the possible adverse reactions/side effects for all the Medicines you take and that have been prescribed to you. Take any new Medicines after you have completely understood and accpet all the possible adverse  reactions/side effects.   Do not drive when taking Pain medications or sleeping medications (Benzodaizepines)  Do not take more than prescribed Pain, Sleep and Anxiety Medications. It is not advisable to combine anxiety,sleep and pain medications without talking with your primary care practitioner  Special Instructions: If you have smoked or chewed Tobacco  in the last 2 yrs please stop smoking, stop any regular Alcohol  and or any Recreational drug use.  Wear Seat belts while driving.  Please note: You were cared for by a hospitalist during your hospital stay. Once you are discharged, your primary care physician will handle any further medical issues. Please note that NO REFILLS for any discharge medications will be authorized once you are discharged, as it is imperative that  you return to your primary care physician (or establish a relationship with a primary care physician if you do not have one) for your post hospital discharge needs so that they can reassess your need for medications and monitor your lab values.   1.) 3 weeks of isolation from 7/29   2.)You have been scheduled for outpatient Remdesivir infusions at 11am onWednesday 8/11 and Thursday 8/12 at [facility]. Please inform the patient to park [place], [place], as staff will be escorting the patient through the [facility] entrance of the hospital.  There is a wave flag banner located near the entrance on [address]. Turn into this entranceand immediatelyturn left and park in 1 of the 5 designated Covid Infusion Parking spots. There is a phone number on the sign, please call and let the staff know what spot you are in and we will come out and get you. For questions call [phone]. Thanks.   Increase activity slowly   Complete by: As directed      Allergies as of 04/28/2020   No Known Allergies     Medication List    STOP taking these medications   azithromycin 250 MG tablet Commonly known as:  Zithromax   B-12 PO   dexamethasone 1 MG tablet Commonly known as: DECADRON   FISH OIL PO   promethazine-codeine 6.25-10 MG/5ML syrup Commonly known as: PHENERGAN with CODEINE     TAKE these medications   albuterol 108 (90 Base) MCG/ACT inhaler Commonly known as: VENTOLIN HFA Inhale 2 puffs into the lungs every 4 (four) hours as needed for wheezing or shortness of breath.   APPLE CIDER VINEGAR PO Take 2 capsules by mouth daily.   ascorbic acid 500 MG tablet Commonly known as: VITAMIN C Take 500-1,000 mg by mouth daily.   aspirin EC 81 MG tablet Take 81 mg by mouth in the morning. Swallow whole.   benzonatate 200 MG capsule Commonly known as: TESSALON Take 1 capsule (200 mg total) by mouth 3 (three) times daily.   chlorpheniramine-HYDROcodone 10-8 MG/5ML Suer Commonly known as: TUSSIONEX Take 5 mLs by mouth every 12 (twelve) hours as needed for cough.   Cinnamon 500 MG capsule Take 500 mg by mouth 2 (two) times daily.   D3-50 1.25 MG (50000 UT) capsule Generic drug: Cholecalciferol Take 1 tablet 3 x /week on Mon Wed & Fri for Severe Vitamin D Deficiency What changed:   how much to take  how to take this  when to take this  additional instructions   fexofenadine 180 MG tablet Commonly known as: ALLEGRA TAKE 1 TABLET BY MOUTH EVERY DAY What changed:   when to take this  reasons to take this   lisinopril 5 MG tablet Commonly known as: ZESTRIL TAKE 1 TABLET DAILY FOR BLOOD PRESSURE What changed:   how much to take  how to take this  when to take this  additional instructions   metFORMIN 500 MG 24 hr tablet Commonly known as: GLUCOPHAGE-XR Take 2 tablets 2 x /day with Meals for Diabetes What changed:   how much to take  how to take this  when to take this  additional instructions   nitroGLYCERIN 0.4 MG SL tablet Commonly known as: Nitrostat Place 1 tablet (0.4 mg total) under the tongue every 5 (five) minutes as needed for chest  pain.   predniSONE 10 MG tablet Commonly known as: DELTASONE Take 4 tablets (40 mg) daily for 2 days, then, Take 3 tablets (  30 mg) daily for 2 days, then, Take 2 tablets (20 mg) daily for 2 days, then, Take 1 tablets (10 mg) daily for 1 days, then stop   traMADol 50 MG tablet Commonly known as: Ultram Take 1 tablet (50 mg total) by mouth every 6 (six) hours as needed. What changed: reasons to take this   Vitamin B-12 5000 MCG Subl Place 5,000 mcg under the tongue daily.   VITAMIN E PO Take 1 capsule by mouth daily.   zinc gluconate 50 MG tablet Take 200 mg by mouth daily.       Follow-up Information    Unk Pinto, MD. Schedule an appointment as soon as possible for a visit in 1 week(s).   Specialty: Internal Medicine Contact information: [address] [place] [address] [phone]              No Known Allergies    Other Procedures/Studies: DG Chest 2 View  Result Date: 04/24/2020 CLINICAL DATA:  COVID EXAM: CHEST - 2 VIEW COMPARISON:  December 21, 2018 FINDINGS: The cardiomediastinal silhouette is normal in contour. No pleural effusion. No pneumothorax. There are bilateral peripheral predominant heterogeneous opacities. Status post cholecystectomy. No acute osseous abnormality noted. IMPRESSION: Bilateral peripheral predominant heterogeneous opacities consistent with COVID-19 pneumonia. Electronically Signed   By: Valentino Saxon MD   On: 04/24/2020 14:40   DG Chest Port 1 View  Result Date: 04/26/2020 CLINICAL DATA:  Positive test for Covid on 04/16/2020. recently diagnosed with pneumonia. Shortness of breath. EXAM: PORTABLE CHEST 1 VIEW COMPARISON:  04/24/2020 FINDINGS: Better lung expansion compared with most recent exam. There are persistent patchy airspace filling opacities along the periphery of the LEFT lung. There is persistent peripheral patchy opacity in the RIGHT mid to LOWER lung zone. No pulmonary edema or effusions. IMPRESSION:  Stable multifocal infiltrates. Electronically Signed   By: Nolon Nations M.D.   On: 04/26/2020 09:37     TODAY-DAY OF DISCHARGE:  Subjective:   Gregory Jacobs today has no headache,no chest abdominal pain,no new weakness tingling or numbness, feels much better wants to go home today.   Objective:   Blood pressure 111/64, pulse (!) 57, temperature 98 F (36.7 C), temperature source Oral, resp. rate (!) 25, height 6' 2.5" (1.892 m), weight 98.3 kg, SpO2 95 %.  Intake/Output Summary (Last 24 hours) at 04/28/2020 1014 Last data filed at 04/28/2020 0851 Gross per 24 hour  Intake 1015 ml  Output 1401 ml  Net -386 ml   Filed Weights   04/26/20 0822 04/26/20 2000  Weight: 93.4 kg 98.3 kg    Exam: Awake Alert, Oriented *3, No new F.N deficits, Normal affect [place].AT,PERRAL Supple Neck,No JVD, No cervical lymphadenopathy appriciated.  Symmetrical Chest wall movement, Good air movement bilaterally, CTAB RRR,No Gallops,Rubs or new Murmurs, No Parasternal Heave +ve B.Sounds, Abd Soft, Non tender, No organomegaly appriciated, No rebound -guarding or rigidity. No Cyanosis, Clubbing or edema, No new Rash or bruise   PERTINENT RADIOLOGIC STUDIES: DG Chest 2 View  Result Date: 04/24/2020 CLINICAL DATA:  COVID EXAM: CHEST - 2 VIEW COMPARISON:  December 21, 2018 FINDINGS: The cardiomediastinal silhouette is normal in contour. No pleural effusion. No pneumothorax. There are bilateral peripheral predominant heterogeneous opacities. Status post cholecystectomy. No acute osseous abnormality noted. IMPRESSION: Bilateral peripheral predominant heterogeneous opacities consistent with COVID-19 pneumonia. Electronically Signed   By: Valentino Saxon MD   On: 04/24/2020 14:40   DG Chest Port 1 View  Result Date: 04/26/2020 CLINICAL DATA:  Positive test for Covid on 04/16/2020. recently diagnosed with pneumonia. Shortness of breath. EXAM: PORTABLE CHEST 1 VIEW COMPARISON:  04/24/2020 FINDINGS: Better lung  expansion compared with most recent exam. There are persistent patchy airspace filling opacities along the periphery of the LEFT lung. There is persistent peripheral patchy opacity in the RIGHT mid to LOWER lung zone. No pulmonary edema or effusions. IMPRESSION: Stable multifocal infiltrates. Electronically Signed   By: Nolon Nations M.D.   On: 04/26/2020 09:37     PERTINENT LAB RESULTS: CBC: Recent Labs    04/27/20 0157 04/28/20 0710  WBC 8.7 7.4  HGB 15.8 14.9  HCT 46.6 43.9  PLT 137* 163   CMET CMP     Component Value Date/Time   NA 138 04/28/2020 0710   K 4.7 04/28/2020 0710   CL 100 04/28/2020 0710   CO2 23 04/28/2020 0710   GLUCOSE 275 (H) 04/28/2020 0710   BUN 21 (H) 04/28/2020 0710   CREATININE 0.87 04/28/2020 0710   CREATININE 1.13 04/15/2020 1204   CALCIUM 9.3 04/28/2020 0710   PROT 6.1 (L) 04/28/2020 0710   ALBUMIN 2.5 (L) 04/28/2020 0710   AST 35 04/28/2020 0710   ALT 72 (H) 04/28/2020 0710   ALKPHOS 45 04/28/2020 0710   BILITOT 0.9 04/28/2020 0710   GFRNONAA >60 04/28/2020 0710   GFRNONAA 75 04/15/2020 1204   GFRAA >60 04/28/2020 0710   GFRAA 87 04/15/2020 1204    GFR Estimated Creatinine Clearance: 119.8 mL/min (by C-G formula based on SCr of 0.87 mg/dL). No results for input(s): LIPASE, AMYLASE in the last 72 hours. No results for input(s): CKTOTAL, CKMB, CKMBINDEX, TROPONINI in the last 72 hours. Invalid input(s): POCBNP Recent Labs    04/27/20 0157 04/28/20 0710  DDIMER 1.76* 0.41   No results for input(s): HGBA1C in the last 72 hours. Recent Labs    04/26/20 0834  TRIG 112   No results for input(s): TSH, T4TOTAL, T3FREE, THYROIDAB in the last 72 hours.  Invalid input(s): FREET3 Recent Labs    04/27/20 0157 04/28/20 0710  FERRITIN 1,206* 943*   Coags: No results for input(s): INR in the last 72 hours.  Invalid input(s): PT Microbiology: Recent Results (from the past 240 hour(s))  Blood Culture (routine x 2)     Status: None  (Preliminary result)   Collection Time: 04/26/20  8:30 AM   Specimen: BLOOD  Result Value Ref Range Status   Specimen Description   Final    BLOOD RIGHT ANTECUBITAL Performed at [facility], [place] [address] [place], [place] [address]    Special Requests   Final    BOTTLES DRAWN AEROBIC AND ANAEROBIC Blood Culture adequate volume Performed at [facility], [address] [place], [place] [address]    Culture   Final    NO GROWTH < 24 HOURS Performed at [facility], [place] [address] [place], [place] [address]    Report Status PENDING  Incomplete  SARS Coronavirus 2 by RT PCR (hospital order, performed in [facility] hospital lab) Nasopharyngeal Nasopharyngeal Swab     Status: Abnormal   Collection Time: 04/26/20  8:58 AM   Specimen: Nasopharyngeal Swab  Result Value Ref Range Status   SARS Coronavirus 2 POSITIVE (A) NEGATIVE Final    Comment: RESULT CALLED TO, READ BACK BY AND VERIFIED WITH: CALLED TO S.GOUGE AT 1008 ON [id number] BY SROY (NOTE) SARS-CoV-2 target nucleic acids are DETECTED  SARS-CoV-2 RNA is generally detectable in upper  respiratory specimens  during the acute phase of infection.  Positive results are indicative  of the presence of the identified virus, but do not rule out bacterial infection or co-infection with other pathogens not detected by the test.  Clinical correlation with patient history and  other diagnostic information is necessary to determine patient infection status.  The expected result is negative.  Fact Sheet for Patients:   StrictlyIdeas.no   Fact Sheet for Healthcare Providers:   BankingDealers.co.za    This test is not yet approved or cleared by the [place] FDA and  has been authorized for detection and/or diagnosis of SARS-CoV-2 by FDA under an Emergency Use Authorization (EUA).  This EUA will remain in effect (meanin g this test can be used) for the duration of    the COVID-19 declaration under Section 564(b)(1) of the Act, 21 U.S.C. section 360-bbb-3(b)(1), unless the authorization is terminated or revoked sooner.  Performed at [facility], [address] [place], [place] [address]   Blood Culture (routine x 2)     Status: None (Preliminary result)   Collection Time: 04/26/20  8:59 AM   Specimen: BLOOD  Result Value Ref Range Status   Specimen Description   Final    BLOOD LEFT ANTECUBITAL Performed at [facility], [place] [address], [place], [place] [address]    Special Requests   Final    BOTTLES DRAWN AEROBIC ONLY Blood Culture results may not be optimal due to an inadequate volume of blood received in culture bottles Performed at [facility], [address] [place], [place] [address]    Culture   Final    NO GROWTH < 24 HOURS Performed at [facility], [place] [address], [place], [place] [address]    Report Status PENDING  Incomplete    FURTHER DISCHARGE INSTRUCTIONS:  Get Medicines reviewed and adjusted: Please take all your medications with you for your next visit with your Primary MD  Laboratory/radiological data: Please request your Primary MD to go over all hospital tests and procedure/radiological results at the follow up, please ask your Primary MD to get all Hospital records sent to his/her office.  In some cases, they will be blood work, cultures and biopsy results pending at the time of your discharge. Please request that your primary care M.D. goes through all the records of your hospital data and follows up on these results.  Also Note the following: If you experience worsening of your admission symptoms, develop shortness of breath, life threatening emergency, suicidal or homicidal thoughts you must seek medical attention immediately by calling 911 or calling your MD immediately  if symptoms less severe.  You must read complete instructions/literature along with all the possible adverse  reactions/side effects for all the Medicines you take and that have been prescribed to you. Take any new Medicines after you have completely understood and accpet all the possible adverse reactions/side effects.   Do not drive when taking Pain medications or sleeping medications (Benzodaizepines)  Do not take more than prescribed Pain, Sleep and Anxiety Medications. It is not advisable to combine anxiety,sleep and pain medications without talking with your primary care practitioner  Special Instructions: If you have smoked or chewed Tobacco  in the last 2 yrs please stop smoking, stop any regular Alcohol  and or any Recreational drug use.  Wear Seat belts while driving.  Please note: You were cared for by a hospitalist during your hospital stay. Once  you are discharged, your primary care physician will handle any further medical issues. Please note that NO REFILLS for any discharge medications will be authorized once you are discharged, as it is imperative that you return to your primary care physician (or establish a relationship with a primary care physician if you do not have one) for your post hospital discharge needs so that they can reassess your need for medications and monitor your lab values.  Total Time spent coordinating discharge including counseling, education and face to face time equals 35 minutes.  Signed: Shannette Tabares 04/28/2020 10:14 AM

## 2020-04-28 NOTE — Discharge Summary (Signed)
PATIENT DETAILS Name: Gregory Jacobs Age: 49 y.o. Sex: male Date of Birth: 1971/05/15 MRN: [id number]. Admitting Physician: Eben Burow, MD QIW:[id number], Gwyndolyn Saxon, MD  Admit Date: 04/26/2020 Discharge date: 04/28/2020  Recommendations for Outpatient Follow-up:  1. Follow up with PCP in 1-2 weeks 2. Please obtain CMP/CBC in one week 3. Repeat Chest Xray in 4-6 week  Admitted From:  Home  Disposition: [place]: No  Equipment/Devices: None  Discharge Condition: Stable  CODE STATUS: FULL CODE  Diet recommendation:  Diet Order            Diet - low sodium heart healthy           Diet Carb Modified           Diet heart healthy/carb modified Room service appropriate? Yes; Fluid consistency: Thin  Diet effective now                  Brief Narrative: Patient is a 49 y.o. male with PMHx of HTN, DM-tested positive for Covid on 7/29 (unvaccinated)-presented to the hospital with a 5-day history of fever, chills, myalgias and worsening shortness of breath-he was found to have COVID-19 pneumonia and admitted to the hospitalist service.  Significant Events: 7/29>> Covid positive (results available in care everywhere) 8/6>> diagnosed with bilateral pneumonia on x-ray-presented to [facility] left due to significant wait time. 8/8>> presented to [facility] with worsening cough/shortness of breath-hypoxic requiring O2 supplementation.  Pneumonia on x-ray.  Significant studies: 8/6>>Chest x-ray: Bilateral peripheral opacities consistent with COVID-19 pneumonia 8/8>> chest x-ray: Stable multifocal infiltrates.  COVID-19 medications: Steroids: 8/8>> Remdesivir:8/8>>  Antibiotics:  None  Microbiology data: 8/8 >>blood culture: No growth  Procedures: None  Consults: None   Brief Hospital Course: Acute Hypoxic Resp Failure due to Covid 19 Viral pneumonia: Clinically improved-titrated off oxygen yesterday-O2 sats have remained stable overnight-he is  still on room air.  Inflammatory markers have down trended.  RN ambulated the patient yesterday-does not require oxygen.  He will complete 2 more doses of remdesivir in the infusion center-he will be on tapering prednisone for a few more days.  Stable for discharge with close follow-up with PCP.  COVID-19 Labs:  Recent Labs    04/26/20 0834 04/27/20 0157 04/28/20 0710  DDIMER 0.58* 1.76* 0.41  FERRITIN 1,063* 1,206* 943*  LDH 251*  --   --   CRP 6.1* 6.7* 2.2*    Lab Results  Component Value Date   SARSCOV2NAA POSITIVE (A) 04/26/2020   [place] Not Detected 08/19/2019     HTN: BP stable-continue lisinopril.  DM-2 with uncontrolled hyperglycemia secondary to steroids:  Resume Metformin on discharge-was on sliding scale insulin and Lantus as he was on higher doses of steroids.  Suspect this steroids get tapered down-his sugars will improve over the next few days.    Note-left a voicemail for spouse on the day of discharge.   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  Stay home except to get medical care You should restrict activities outside your home, except for getting medical care. Do not go to  work, school, or public areas, and do not use public transportation or taxis.  Call ahead before visiting your doctor Before your medical appointment, call the healthcare provider and tell them that you have, or are being evaluated for, COVID-19 infection. This will help the healthcare provider's office take steps to keep other people from getting infected. Ask your healthcare provider to call the local or state health department.  Monitor your symptoms Seek prompt medical attention if your illness is worsening (e.g., difficulty breathing). Before going to your medical appointment, call the healthcare provider and tell them that you have, or are being evaluated for, COVID-19 infection. Ask your healthcare provider to call the local or state health department.  Wear a facemask You should wear a facemask that covers your nose and mouth when you are in the same room with other people and when you visit a healthcare provider. People who live with or visit you should also wear a facemask while they are in the same room with you.  Separate yourself from other people in your home As much as possible, you should stay in a different room from other people in your home. Also, you should use a separate bathroom, if available.  Avoid sharing household items You should not share dishes, drinking glasses, cups, eating utensils, towels, bedding, or other items with other people in your home. After using these items, you should wash them thoroughly with soap and water.  Cover your coughs and sneezes Cover your mouth and nose with a tissue when you cough or sneeze, or you can cough or sneeze into your sleeve. Throw used tissues in a lined trash can, and immediately wash your hands with soap and water for at least 20 seconds or use an alcohol-based hand rub.  Wash your [facility] your hands often and thoroughly with soap and water for at least 20 seconds. You can use an alcohol-based hand sanitizer if  soap and water are not available and if your hands are not visibly dirty. Avoid touching your eyes, nose, and mouth with unwashed hands.   Prevention Steps for Caregivers and Household Members of Individuals Confirmed to have, or Being Evaluated for, COVID-19 Infection Being Cared for in the Home  If you live with, or provide care at home for, a person confirmed to have, or being evaluated for, COVID-19 infection please follow these guidelines to prevent infection:  Follow healthcare provider's instructions Make sure that you understand and can help the patient follow any healthcare provider instructions for all care.  Provide for the patient's basic needs You should help the patient with basic needs in the home and provide support for getting groceries, prescriptions, and other personal needs.  Monitor the patient's symptoms If they are getting sicker, call his or her medical provider and tell them that the patient has, or is being evaluated for, COVID-19 infection. This will help the healthcare provider's office take steps to keep other people from getting infected. Ask the healthcare provider to call the local or state health department.  Limit the number of people who have contact with the patient  If possible, have only one caregiver for the patient.  Other household members should  stay in another home or place of residence. If this is not possible, they should stay  in another room, or be separated from the patient as much as possible. Use a separate bathroom, if available.  Restrict visitors who do not have an essential need to be in the home.  Keep older adults, very young children, and other sick people away from the patient Keep older adults, very young children, and those who have compromised immune systems or chronic health conditions away from the patient. This includes people with chronic heart, lung, or kidney conditions, diabetes, and cancer.  Ensure good  ventilation Make sure that shared spaces in the home have good air flow, such as from an air conditioner or an opened window, weather permitting.  Wash your hands often  Wash your hands often and thoroughly with soap and water for at least 20 seconds. You can use an alcohol based hand sanitizer if soap and water are not available and if your hands are not visibly dirty.  Avoid touching your eyes, nose, and mouth with unwashed hands.  Use disposable paper towels to dry your hands. If not available, use dedicated cloth towels and replace them when they become wet.  Wear a facemask and gloves  Wear a disposable facemask at all times in the room and gloves when you touch or have contact with the patient's blood, body fluids, and/or secretions or excretions, such as sweat, saliva, sputum, nasal mucus, vomit, urine, or feces.  Ensure the mask fits over your nose and mouth tightly, and do not touch it during use.  Throw out disposable facemasks and gloves after using them. Do not reuse.  Wash your hands immediately after removing your facemask and gloves.  If your personal clothing becomes contaminated, carefully remove clothing and launder. Wash your hands after handling contaminated clothing.  Place all used disposable facemasks, gloves, and other waste in a lined container before disposing them with other household waste.  Remove gloves and wash your hands immediately after handling these items.  Do not share dishes, glasses, or other household items with the patient  Avoid sharing household items. You should not share dishes, drinking glasses, cups, eating utensils, towels, bedding, or other items with a patient who is confirmed to have, or being evaluated for, COVID-19 infection.  After the person uses these items, you should wash them thoroughly with soap and water.  Wash laundry thoroughly  Immediately remove and wash clothes or bedding that have blood, body fluids, and/or  secretions or excretions, such as sweat, saliva, sputum, nasal mucus, vomit, urine, or feces, on them.  Wear gloves when handling laundry from the patient.  Read and follow directions on labels of laundry or clothing items and detergent. In general, wash and dry with the warmest temperatures recommended on the label.  Clean all areas the individual has used often  Clean all touchable surfaces, such as counters, tabletops, doorknobs, bathroom fixtures, toilets, phones, keyboards, tablets, and bedside tables, every day. Also, clean any surfaces that may have blood, body fluids, and/or secretions or excretions on them.  Wear gloves when cleaning surfaces the patient has come in contact with.  Use a diluted bleach solution (e.g., dilute bleach with 1 part bleach and 10 parts water) or a household disinfectant with a label that says EPA-registered for coronaviruses. To make a bleach solution at home, add 1 tablespoon of bleach to 1 quart (4 cups) of water. For a larger supply, add  cup of bleach to 1 gallon (  16 cups) of water.  Read labels of cleaning products and follow recommendations provided on product labels. Labels contain instructions for safe and effective use of the cleaning product including precautions you should take when applying the product, such as wearing gloves or eye protection and making sure you have good ventilation during use of the product.  Remove gloves and wash hands immediately after cleaning.  Monitor yourself for signs and symptoms of illness Caregivers and household members are considered close contacts, should monitor their health, and will be asked to limit movement outside of the home to the extent possible. Follow the monitoring steps for close contacts listed on the symptom monitoring form.   ? If you have additional questions, contact your local health department or call the epidemiologist on call at [phone] (available 24/7). ? This guidance is subject  to change. For the most up-to-date guidance from CDC, please refer to their website: YouBlogs.pl    Activity:  As tolerated   Discharge Instructions    Call MD for:  difficulty breathing, headache or visual disturbances   Complete by: As directed    Call MD for:  extreme fatigue   Complete by: As directed    Call MD for:  persistant dizziness or light-headedness   Complete by: As directed    Call MD for:  persistant nausea and vomiting   Complete by: As directed    Diet - low sodium heart healthy   Complete by: As directed    Diet Carb Modified   Complete by: As directed    Discharge instructions   Complete by: As directed    Follow with Primary MD  Unk Pinto, MD in 1-2 weeks  Please get a complete blood count and chemistry panel checked by your Primary MD at your next visit, and again as instructed by your Primary MD.  Get Medicines reviewed and adjusted: Please take all your medications with you for your next visit with your Primary MD  Laboratory/radiological data: Please request your Primary MD to go over all hospital tests and procedure/radiological results at the follow up, please ask your Primary MD to get all Hospital records sent to his/her office.  In some cases, they will be blood work, cultures and biopsy results pending at the time of your discharge. Please request that your primary care M.D. follows up on these results.  Also Note the following: If you experience worsening of your admission symptoms, develop shortness of breath, life threatening emergency, suicidal or homicidal thoughts you must seek medical attention immediately by calling 911 or calling your MD immediately  if symptoms less severe.  You must read complete instructions/literature along with all the possible adverse reactions/side effects for all the Medicines you take and that have been prescribed to you. Take any new Medicines  after you have completely understood and accpet all the possible adverse reactions/side effects.   Do not drive when taking Pain medications or sleeping medications (Benzodaizepines)  Do not take more than prescribed Pain, Sleep and Anxiety Medications. It is not advisable to combine anxiety,sleep and pain medications without talking with your primary care practitioner  Special Instructions: If you have smoked or chewed Tobacco  in the last 2 yrs please stop smoking, stop any regular Alcohol  and or any Recreational drug use.  Wear Seat belts while driving.  Please note: You were cared for by a hospitalist during your hospital stay. Once you are discharged, your primary care physician will handle any further medical issues. Please  note that NO REFILLS for any discharge medications will be authorized once you are discharged, as it is imperative that you return to your primary care physician (or establish a relationship with a primary care physician if you do not have one) for your post hospital discharge needs so that they can reassess your need for medications and monitor your lab values.   1.) 3 weeks of isolation from 7/29   2.)You have been scheduled for outpatient Remdesivir infusions at 11am onWednesday 8/11 and Thursday 8/12 at [facility]. Please inform the patient to park [place], [place], as staff will be escorting the patient through the [place] entrance of the hospital.  There is a wave flag banner located near the entrance on [address]. Turn into this entranceand immediatelyturn left and park in 1 of the 5 designated Covid Infusion Parking spots. There is a phone number on the sign, please call and let the staff know what spot you are in and we will come out and get you. For questions call [phone]. Thanks.   Increase activity slowly   Complete by: As directed      Allergies as of 04/28/2020   No Known Allergies     Medication List    STOP taking  these medications   azithromycin 250 MG tablet Commonly known as: Zithromax   B-12 PO   dexamethasone 1 MG tablet Commonly known as: DECADRON   FISH OIL PO   promethazine-codeine 6.25-10 MG/5ML syrup Commonly known as: PHENERGAN with CODEINE     TAKE these medications   albuterol 108 (90 Base) MCG/ACT inhaler Commonly known as: VENTOLIN HFA Inhale 2 puffs into the lungs every 4 (four) hours as needed for wheezing or shortness of breath.   APPLE CIDER VINEGAR PO Take 2 capsules by mouth daily.   ascorbic acid 500 MG tablet Commonly known as: VITAMIN C Take 500-1,000 mg by mouth daily.   aspirin EC 81 MG tablet Take 81 mg by mouth in the morning. Swallow whole.   benzonatate 200 MG capsule Commonly known as: TESSALON Take 1 capsule (200 mg total) by mouth 3 (three) times daily.   chlorpheniramine-HYDROcodone 10-8 MG/5ML Suer Commonly known as: TUSSIONEX Take 5 mLs by mouth every 12 (twelve) hours as needed for cough.   Cinnamon 500 MG capsule Take 500 mg by mouth 2 (two) times daily.   D3-50 1.25 MG (50000 UT) capsule Generic drug: Cholecalciferol Take 1 tablet 3 x /week on Mon Wed & Fri for Severe Vitamin D Deficiency What changed:   how much to take  how to take this  when to take this  additional instructions   fexofenadine 180 MG tablet Commonly known as: ALLEGRA TAKE 1 TABLET BY MOUTH EVERY DAY What changed:   when to take this  reasons to take this   lisinopril 5 MG tablet Commonly known as: ZESTRIL TAKE 1 TABLET DAILY FOR BLOOD PRESSURE What changed:   how much to take  how to take this  when to take this  additional instructions   metFORMIN 500 MG 24 hr tablet Commonly known as: GLUCOPHAGE-XR Take 2 tablets 2 x /day with Meals for Diabetes What changed:   how much to take  how to take this  when to take this  additional instructions   nitroGLYCERIN 0.4 MG SL tablet Commonly known as: Nitrostat Place 1 tablet (0.4 mg  total) under the tongue every 5 (five) minutes as needed for chest pain.   predniSONE  10 MG tablet Commonly known as: DELTASONE Take 4 tablets (40 mg) daily for 2 days, then, Take 3 tablets (30 mg) daily for 2 days, then, Take 2 tablets (20 mg) daily for 2 days, then, Take 1 tablets (10 mg) daily for 1 days, then stop   traMADol 50 MG tablet Commonly known as: Ultram Take 1 tablet (50 mg total) by mouth every 6 (six) hours as needed. What changed: reasons to take this   Vitamin B-12 5000 MCG Subl Place 5,000 mcg under the tongue daily.   VITAMIN E PO Take 1 capsule by mouth daily.   zinc gluconate 50 MG tablet Take 200 mg by mouth daily.       Follow-up Information    Unk Pinto, MD. Schedule an appointment as soon as possible for a visit in 1 week(s).   Specialty: Internal Medicine Contact information: [address] [place] [address] [phone]              No Known Allergies    Other Procedures/Studies: DG Chest 2 View  Result Date: 04/24/2020 CLINICAL DATA:  COVID EXAM: CHEST - 2 VIEW COMPARISON:  December 21, 2018 FINDINGS: The cardiomediastinal silhouette is normal in contour. No pleural effusion. No pneumothorax. There are bilateral peripheral predominant heterogeneous opacities. Status post cholecystectomy. No acute osseous abnormality noted. IMPRESSION: Bilateral peripheral predominant heterogeneous opacities consistent with COVID-19 pneumonia. Electronically Signed   By: Valentino Saxon MD   On: 04/24/2020 14:40   DG Chest Port 1 View  Result Date: 04/26/2020 CLINICAL DATA:  Positive test for Covid on 04/16/2020. recently diagnosed with pneumonia. Shortness of breath. EXAM: PORTABLE CHEST 1 VIEW COMPARISON:  04/24/2020 FINDINGS: Better lung expansion compared with most recent exam. There are persistent patchy airspace filling opacities along the periphery of the LEFT lung. There is persistent peripheral patchy opacity in the RIGHT mid  to LOWER lung zone. No pulmonary edema or effusions. IMPRESSION: Stable multifocal infiltrates. Electronically Signed   By: Nolon Nations M.D.   On: 04/26/2020 09:37     TODAY-DAY OF DISCHARGE:  Subjective:   [name] today has no headache,no chest abdominal pain,no new weakness tingling or numbness, feels much better wants to go home today.   Objective:   Blood pressure 111/64, pulse 75, temperature 98 F (36.7 C), temperature source Oral, resp. rate 14, height 6' 2.5" (1.892 m), weight 98.3 kg, SpO2 98 %.  Intake/Output Summary (Last 24 hours) at 04/28/2020 1629 Last data filed at 04/28/2020 0851 Gross per 24 hour  Intake 775 ml  Output 801 ml  Net -26 ml   Filed Weights   04/26/20 0822 04/26/20 2000  Weight: 93.4 kg 98.3 kg    Exam: Awake Alert, Oriented *3, No new F.N deficits, Normal affect [place].AT,PERRAL Supple Neck,No JVD, No cervical lymphadenopathy appriciated.  Symmetrical Chest wall movement, Good air movement bilaterally, CTAB RRR,No Gallops,Rubs or new Murmurs, No Parasternal Heave +ve B.Sounds, Abd Soft, Non tender, No organomegaly appriciated, No rebound -guarding or rigidity. No Cyanosis, Clubbing or edema, No new Rash or bruise   PERTINENT RADIOLOGIC STUDIES: DG Chest 2 View  Result Date: 04/24/2020 CLINICAL DATA:  COVID EXAM: CHEST - 2 VIEW COMPARISON:  December 21, 2018 FINDINGS: The cardiomediastinal silhouette is normal in contour. No pleural effusion. No pneumothorax. There are bilateral peripheral predominant heterogeneous opacities. Status post cholecystectomy. No acute osseous abnormality noted. IMPRESSION: Bilateral peripheral predominant heterogeneous opacities consistent with COVID-19 pneumonia. Electronically Signed   By: Valentino Saxon MD  On: 04/24/2020 14:40   DG Chest Port 1 View  Result Date: 04/26/2020 CLINICAL DATA:  Positive test for Covid on 04/16/2020. recently diagnosed with pneumonia. Shortness of breath. EXAM: PORTABLE CHEST 1  VIEW COMPARISON:  04/24/2020 FINDINGS: Better lung expansion compared with most recent exam. There are persistent patchy airspace filling opacities along the periphery of the LEFT lung. There is persistent peripheral patchy opacity in the RIGHT mid to LOWER lung zone. No pulmonary edema or effusions. IMPRESSION: Stable multifocal infiltrates. Electronically Signed   By: Nolon Nations M.D.   On: 04/26/2020 09:37     PERTINENT LAB RESULTS: CBC: Recent Labs    04/27/20 0157 04/28/20 0710  WBC 8.7 7.4  HGB 15.8 14.9  HCT 46.6 43.9  PLT 137* 163   CMET CMP     Component Value Date/Time   NA 138 04/28/2020 0710   K 4.7 04/28/2020 0710   CL 100 04/28/2020 0710   CO2 23 04/28/2020 0710   GLUCOSE 275 (H) 04/28/2020 0710   BUN 21 (H) 04/28/2020 0710   CREATININE 0.87 04/28/2020 0710   CREATININE 1.13 04/15/2020 1204   CALCIUM 9.3 04/28/2020 0710   PROT 6.1 (L) 04/28/2020 0710   ALBUMIN 2.5 (L) 04/28/2020 0710   AST 35 04/28/2020 0710   ALT 72 (H) 04/28/2020 0710   ALKPHOS 45 04/28/2020 0710   BILITOT 0.9 04/28/2020 0710   GFRNONAA >60 04/28/2020 0710   GFRNONAA 75 04/15/2020 1204   GFRAA >60 04/28/2020 0710   GFRAA 87 04/15/2020 1204    GFR Estimated Creatinine Clearance: 119.8 mL/min (by C-G formula based on SCr of 0.87 mg/dL). No results for input(s): LIPASE, AMYLASE in the last 72 hours. No results for input(s): CKTOTAL, CKMB, CKMBINDEX, TROPONINI in the last 72 hours. Invalid input(s): POCBNP Recent Labs    04/27/20 0157 04/28/20 0710  DDIMER 1.76* 0.41   No results for input(s): HGBA1C in the last 72 hours. Recent Labs    04/26/20 0834  TRIG 112   No results for input(s): TSH, T4TOTAL, T3FREE, THYROIDAB in the last 72 hours.  Invalid input(s): FREET3 Recent Labs    04/27/20 0157 04/28/20 0710  FERRITIN 1,206* 943*   Coags: No results for input(s): INR in the last 72 hours.  Invalid input(s): PT Microbiology: Recent Results (from the past 240  hour(s))  Blood Culture (routine x 2)     Status: None (Preliminary result)   Collection Time: 04/26/20  8:30 AM   Specimen: BLOOD  Result Value Ref Range Status   Specimen Description   Final    BLOOD RIGHT ANTECUBITAL Performed at [facility], [place] [address], [place], [place] [address]    Special Requests   Final    BOTTLES DRAWN AEROBIC AND ANAEROBIC Blood Culture adequate volume Performed at [facility], [place] [place], [place] [address]    Culture   Final    NO GROWTH 2 DAYS Performed at [facility], [place] [address], [place], [place] [address]    Report Status PENDING  Incomplete  SARS Coronavirus 2 by RT PCR (hospital order, performed in [facility] hospital lab) Nasopharyngeal Nasopharyngeal Swab     Status: Abnormal   Collection Time: 04/26/20  8:58 AM   Specimen: Nasopharyngeal Swab  Result Value Ref Range Status   SARS Coronavirus 2 POSITIVE (A) NEGATIVE Final    Comment: RESULT CALLED TO, READ BACK BY AND VERIFIED WITH: CALLED TO S.GOUGE AT [place] ON [id number] BY  SROY (NOTE) SARS-CoV-2 target nucleic acids are DETECTED  SARS-CoV-2 RNA is generally detectable in upper respiratory specimens  during the acute phase of infection.  Positive results are indicative  of the presence of the identified virus, but do not rule out bacterial infection or co-infection with other pathogens not detected by the test.  Clinical correlation with patient history and  other diagnostic information is necessary to determine patient infection status.  The expected result is negative.  Fact Sheet for Patients:   StrictlyIdeas.no   Fact Sheet for Healthcare Providers:   BankingDealers.co.za    This test is not yet approved or cleared by the [place] FDA and  has been authorized for detection and/or diagnosis of SARS-CoV-2 by FDA under an Emergency Use Authorization (EUA).  This EUA will remain in effect  (meanin g this test can be used) for the duration of  the COVID-19 declaration under Section 564(b)(1) of the Act, 21 U.S.C. section 360-bbb-3(b)(1), unless the authorization is terminated or revoked sooner.  Performed at [facility] [address] [place], [place] [address]   Blood Culture (routine x 2)     Status: None (Preliminary result)   Collection Time: 04/26/20  8:59 AM   Specimen: BLOOD  Result Value Ref Range Status   Specimen Description   Final    BLOOD LEFT ANTECUBITAL Performed at [facility], [place] [address] [place],  [address]    Special Requests   Final    BOTTLES DRAWN AEROBIC ONLY Blood Culture results may not be optimal due to an inadequate volume of blood received in culture bottles Performed at [facility], [address] [place], [place] [address]    Culture   Final    NO GROWTH 2 DAYS Performed at [facility], [place] [address], [place],  [address]    Report Status PENDING  Incomplete    FURTHER DISCHARGE INSTRUCTIONS:  Get Medicines reviewed and adjusted: Please take all your medications with you for your next visit with your Primary MD  Laboratory/radiological data: Please request your Primary MD to go over all hospital tests and procedure/radiological results at the follow up, please ask your Primary MD to get all Hospital records sent to his/her office.  In some cases, they will be blood work, cultures and biopsy results pending at the time of your discharge. Please request that your primary care M.D. goes through all the records of your hospital data and follows up on these results.  Also Note the following: If you experience worsening of your admission symptoms, develop shortness of breath, life threatening emergency, suicidal or homicidal thoughts you must seek medical attention immediately by calling 911 or calling your MD immediately  if symptoms less severe.  You must read complete  instructions/literature along with all the possible adverse reactions/side effects for all the Medicines you take and that have been prescribed to you. Take any new Medicines after you have completely understood and accpet all the possible adverse reactions/side effects.   Do not drive when taking Pain medications or sleeping medications (Benzodaizepines)  Do not take more than prescribed Pain, Sleep and Anxiety Medications. It is not advisable to combine anxiety,sleep and pain medications without talking with your primary care practitioner  Special Instructions: If you have smoked or chewed Tobacco  in the last 2 yrs please stop smoking, stop any regular Alcohol  and or any Recreational drug use.  Wear Seat belts while driving.  Please note: You were cared for by a hospitalist during your hospital stay. Once you are discharged, your primary care physician will handle any further medical issues. Please note that NO REFILLS for any discharge medications will be authorized once you are discharged, as it is imperative that you return to your primary care physician (or establish a relationship with a primary care physician if you do not have one) for your post hospital discharge needs so that they can reassess your need for medications and monitor your lab values.  Total Time spent coordinating discharge including counseling, education and face to face time equals 35 minutes.  SignedOren Binet 04/28/2020 4:29 PM

## 2020-04-28 NOTE — Progress Notes (Signed)
Patient scheduled for outpatient Remdesivir infusions at 11am on Wednesday 8/11 and Thursday 8/12 at [facility]. Please inform the patient to park at [place], as staff will be escorting the patient through the [place] entrance of the hospital.    There is a wave flag banner located near the entrance on [address]. Turn into this entrance and immediately turn left and park in 1 of the 5 designated Covid Infusion Parking spots. There is a phone number on the sign, please call and let the staff know what spot you are in and we will come out and get you. For questions call [phone].  Thanks.  * If patient is getting dropped off for their appointment they may pull up to the main entrance of [facility] hospital. Please instruct the patient to wait in their car, call [phone], until staff comes to escort the patient through the hospital.

## 2020-04-29 ENCOUNTER — Ambulatory Visit (HOSPITAL_COMMUNITY)
Admit: 2020-04-29 | Discharge: 2020-04-29 | Disposition: A | Discharge status: Home / Self Care | Payer: Managed Care, Other (non HMO) | Source: Ambulatory Visit | Attending: Pulmonary Disease | Admitting: Pulmonary Disease

## 2020-04-29 DIAGNOSIS — J1289 Other viral pneumonia: Secondary | ICD-10-CM | POA: Insufficient documentation

## 2020-04-29 DIAGNOSIS — U071 COVID-19: Secondary | ICD-10-CM | POA: Insufficient documentation

## 2020-04-29 MED ORDER — ALBUTEROL SULFATE HFA 108 (90 BASE) MCG/ACT IN AERS: 2.0000 | INHALATION_SPRAY | Freq: Once | RESPIRATORY_TRACT | Status: DC | PRN

## 2020-04-29 MED ORDER — SODIUM CHLORIDE 0.9 % IV SOLN
100.0000 mg | Freq: Once | INTRAVENOUS | Status: AC
  Administered 2020-04-29: 100 mg via INTRAVENOUS
  Filled 2020-04-29: qty 20

## 2020-04-29 MED ORDER — SODIUM CHLORIDE 0.9 % IV SOLN: INTRAVENOUS | Status: DC | PRN

## 2020-04-29 MED ORDER — METHYLPREDNISOLONE SODIUM SUCC 125 MG IJ SOLR: 125.0000 mg | Freq: Once | INTRAMUSCULAR | Status: DC | PRN

## 2020-04-29 MED ORDER — SODIUM CHLORIDE 0.9 % IV SOLN: 100.0000 mg | Freq: Once | INTRAVENOUS | Status: DC

## 2020-04-29 MED ORDER — DIPHENHYDRAMINE HCL 50 MG/ML IJ SOLN: 50.0000 mg | Freq: Once | INTRAMUSCULAR | Status: DC | PRN

## 2020-04-29 MED ORDER — EPINEPHRINE 0.3 MG/0.3ML IJ SOAJ: 0.3000 mg | Freq: Once | INTRAMUSCULAR | Status: DC | PRN

## 2020-04-29 MED ORDER — FAMOTIDINE IN NACL 20-0.9 MG/50ML-% IV SOLN: 20.0000 mg | Freq: Once | INTRAVENOUS | Status: DC | PRN

## 2020-04-29 NOTE — Discharge Instructions (Signed)
10 Things You Can Do to Manage Your COVID-19 Symptoms at Home If you have possible or confirmed COVID-19: 1. Stay home from work and school. And stay away from other public places. If you must go out, avoid using any kind of public transportation, ridesharing, or taxis. 2. Monitor your symptoms carefully. If your symptoms get worse, call your healthcare provider immediately. 3. Get rest and stay hydrated. 4. If you have a medical appointment, call the healthcare provider ahead of time and tell them that you have or may have COVID-19. 5. For medical emergencies, call 911 and notify the dispatch personnel that you have or may have COVID-19. 6. Cover your cough and sneezes with a tissue or use the inside of your elbow. 7. Wash your hands often with soap and water for at least 20 seconds or clean your hands with an alcohol-based hand sanitizer that contains at least 60% alcohol. 8. As much as possible, stay in a specific room and away from other people in your home. Also, you should use a separate bathroom, if available. If you need to be around other people in or outside of the home, wear a mask. 9. Avoid sharing personal items with other people in your household, like dishes, towels, and bedding. 10. Clean all surfaces that are touched often, like counters, tabletops, and doorknobs. Use household cleaning sprays or wipes according to the label instructions. cdc.gov/coronavirus 03/20/2019 This information is not intended to replace advice given to you by your health care provider. Make sure you discuss any questions you have with your health care provider. Document Revised: 08/22/2019 Document Reviewed: 08/22/2019 Elsevier Patient Education  2020 Elsevier Inc.  

## 2020-04-29 NOTE — Progress Notes (Signed)
  Diagnosis: COVID-19  Physician:patrick wright   Procedure: Covid Infusion Clinic Med: remdesivir infusion - Provided patient with remdesivir fact sheet for patients, parents and caregivers prior to infusion.  Complications: No immediate complications noted.  Discharge: Discharged home   Heide Scales 04/29/2020

## 2020-04-30 ENCOUNTER — Inpatient Hospital Stay (HOSPITAL_COMMUNITY)
Admission: EM | Admit: 2020-04-30 | Discharge: 2020-05-03 | DRG: 177 | Disposition: A | Discharge status: Home / Self Care | Payer: Managed Care, Other (non HMO) | Attending: Internal Medicine | Admitting: Internal Medicine

## 2020-04-30 ENCOUNTER — Encounter (HOSPITAL_COMMUNITY): Payer: Self-pay | Admitting: Emergency Medicine

## 2020-04-30 ENCOUNTER — Ambulatory Visit (HOSPITAL_COMMUNITY)
Admit: 2020-04-30 | Discharge: 2020-04-30 | Disposition: A | Discharge status: Home / Self Care | Payer: Managed Care, Other (non HMO) | Attending: Pulmonary Disease | Admitting: Pulmonary Disease

## 2020-04-30 ENCOUNTER — Inpatient Hospital Stay (HOSPITAL_COMMUNITY): Payer: Managed Care, Other (non HMO)

## 2020-04-30 ENCOUNTER — Emergency Department (HOSPITAL_COMMUNITY): Payer: Managed Care, Other (non HMO)

## 2020-04-30 ENCOUNTER — Other Ambulatory Visit: Payer: Self-pay

## 2020-04-30 DIAGNOSIS — J1282 Pneumonia due to coronavirus disease 2019: Secondary | ICD-10-CM | POA: Diagnosis present

## 2020-04-30 DIAGNOSIS — E1122 Type 2 diabetes mellitus with diabetic chronic kidney disease: Secondary | ICD-10-CM | POA: Diagnosis present

## 2020-04-30 DIAGNOSIS — J9601 Acute respiratory failure with hypoxia: Secondary | ICD-10-CM | POA: Diagnosis not present

## 2020-04-30 DIAGNOSIS — I10 Essential (primary) hypertension: Secondary | ICD-10-CM | POA: Diagnosis present

## 2020-04-30 DIAGNOSIS — R7989 Other specified abnormal findings of blood chemistry: Secondary | ICD-10-CM

## 2020-04-30 DIAGNOSIS — I129 Hypertensive chronic kidney disease with stage 1 through stage 4 chronic kidney disease, or unspecified chronic kidney disease: Secondary | ICD-10-CM | POA: Diagnosis present

## 2020-04-30 DIAGNOSIS — Z7982 Long term (current) use of aspirin: Secondary | ICD-10-CM | POA: Diagnosis not present

## 2020-04-30 DIAGNOSIS — E785 Hyperlipidemia, unspecified: Secondary | ICD-10-CM | POA: Diagnosis not present

## 2020-04-30 DIAGNOSIS — Z79899 Other long term (current) drug therapy: Secondary | ICD-10-CM

## 2020-04-30 DIAGNOSIS — T380X5A Adverse effect of glucocorticoids and synthetic analogues, initial encounter: Secondary | ICD-10-CM | POA: Diagnosis present

## 2020-04-30 DIAGNOSIS — N182 Chronic kidney disease, stage 2 (mild): Secondary | ICD-10-CM | POA: Diagnosis not present

## 2020-04-30 DIAGNOSIS — E1169 Type 2 diabetes mellitus with other specified complication: Secondary | ICD-10-CM | POA: Diagnosis present

## 2020-04-30 DIAGNOSIS — E871 Hypo-osmolality and hyponatremia: Secondary | ICD-10-CM | POA: Diagnosis present

## 2020-04-30 DIAGNOSIS — Z794 Long term (current) use of insulin: Secondary | ICD-10-CM

## 2020-04-30 DIAGNOSIS — E861 Hypovolemia: Secondary | ICD-10-CM | POA: Diagnosis present

## 2020-04-30 DIAGNOSIS — Z9049 Acquired absence of other specified parts of digestive tract: Secondary | ICD-10-CM

## 2020-04-30 DIAGNOSIS — U071 COVID-19: Principal | ICD-10-CM | POA: Diagnosis present

## 2020-04-30 DIAGNOSIS — Z87891 Personal history of nicotine dependence: Secondary | ICD-10-CM

## 2020-04-30 LAB — HEPATITIS PANEL, ACUTE
HCV Ab: NONREACTIVE
Hep A IgM: NONREACTIVE
Hep B C IgM: NONREACTIVE
Hepatitis B Surface Ag: NONREACTIVE

## 2020-04-30 LAB — COMPREHENSIVE METABOLIC PANEL
ALT: 111 U/L — ABNORMAL HIGH (ref 0–44)
AST: 54 U/L — ABNORMAL HIGH (ref 15–41)
Albumin: 2.9 g/dL — ABNORMAL LOW (ref 3.5–5.0)
Alkaline Phosphatase: 55 U/L (ref 38–126)
Anion gap: 8 (ref 5–15)
BUN: 22 mg/dL — ABNORMAL HIGH (ref 6–20)
CO2: 23 mmol/L (ref 22–32)
Calcium: 8.2 mg/dL — ABNORMAL LOW (ref 8.9–10.3)
Chloride: 100 mmol/L (ref 98–111)
Creatinine, Ser: 0.89 mg/dL (ref 0.61–1.24)
GFR calc Af Amer: >60 mL/min (ref >60)
GFR calc non Af Amer: >60 mL/min (ref >60)
Glucose, Bld: 263 mg/dL — ABNORMAL HIGH (ref 70–99)
Potassium: 4 mmol/L (ref 3.5–5.1)
Sodium: 131 mmol/L — ABNORMAL LOW (ref 135–145)
Total Bilirubin: 1.3 mg/dL — ABNORMAL HIGH (ref 0.3–1.2)
Total Protein: 6.3 g/dL — ABNORMAL LOW (ref 6.5–8.1)

## 2020-04-30 LAB — CBC WITH DIFFERENTIAL/PLATELET
Abs Immature Granulocytes: 0.09 10*3/uL — ABNORMAL HIGH (ref 0.00–0.07)
Basophils Absolute: 0 10*3/uL (ref 0.0–0.1)
Basophils Relative: 0 %
Eosinophils Absolute: 0 10*3/uL (ref 0.0–0.5)
Eosinophils Relative: 0 %
HCT: 43.6 % (ref 39.0–52.0)
Hemoglobin: 15 g/dL (ref 13.0–17.0)
Immature Granulocytes: 1 %
Lymphocytes Relative: 6 %
Lymphs Abs: 0.8 10*3/uL (ref 0.7–4.0)
MCH: 30.1 pg (ref 26.0–34.0)
MCHC: 34.4 g/dL (ref 30.0–36.0)
MCV: 87.4 fL (ref 80.0–100.0)
Monocytes Absolute: 1.1 10*3/uL — ABNORMAL HIGH (ref 0.1–1.0)
Monocytes Relative: 8 %
Neutro Abs: 11.7 10*3/uL — ABNORMAL HIGH (ref 1.7–7.7)
Neutrophils Relative %: 85 %
Platelets: 159 10*3/uL (ref 150–400)
RBC: 4.99 MIL/uL (ref 4.22–5.81)
RDW: 11.7 % (ref 11.5–15.5)
WBC: 13.6 10*3/uL — ABNORMAL HIGH (ref 4.0–10.5)
nRBC: 0 % (ref 0.0–0.2)

## 2020-04-30 LAB — PROCALCITONIN: Procalcitonin: <0.1 ng/mL

## 2020-04-30 LAB — LACTIC ACID, PLASMA
Lactic Acid, Venous: 2.2 mmol/L (ref 0.5–1.9)
Lactic Acid, Venous: 2.2 mmol/L (ref 0.5–1.9)

## 2020-04-30 LAB — FIBRINOGEN: Fibrinogen: 567 mg/dL — ABNORMAL HIGH (ref 210–475)

## 2020-04-30 LAB — FERRITIN: Ferritin: 1014 ng/mL — ABNORMAL HIGH (ref 24–336)

## 2020-04-30 LAB — CBG MONITORING, ED
Glucose-Capillary: 306 mg/dL — ABNORMAL HIGH (ref 70–99)
Glucose-Capillary: 294 mg/dL — ABNORMAL HIGH (ref 70–99)

## 2020-04-30 LAB — ABO/RH: ABO/RH(D): A POS

## 2020-04-30 LAB — D-DIMER, QUANTITATIVE: D-Dimer, Quant: 0.49 ug/mL-FEU (ref 0.00–0.50)

## 2020-04-30 LAB — TRIGLYCERIDES: Triglycerides: 81 mg/dL (ref <150)

## 2020-04-30 LAB — C-REACTIVE PROTEIN: CRP: 4.6 mg/dL — ABNORMAL HIGH (ref <1.0)

## 2020-04-30 LAB — LACTATE DEHYDROGENASE: LDH: 189 U/L (ref 98–192)

## 2020-04-30 MED ORDER — FAMOTIDINE IN NACL 20-0.9 MG/50ML-% IV SOLN: 20.0000 mg | Freq: Once | INTRAVENOUS | Status: DC | PRN

## 2020-04-30 MED ORDER — SODIUM CHLORIDE 0.9 % IV SOLN: 200.0000 mg | Freq: Once | INTRAVENOUS | Status: DC

## 2020-04-30 MED ORDER — ONDANSETRON HCL 4 MG PO TABS: 4.0000 mg | ORAL_TABLET | Freq: Four times a day (QID) | ORAL | Status: DC | PRN

## 2020-04-30 MED ORDER — PREDNISONE 20 MG PO TABS
60.0000 mg | ORAL_TABLET | Freq: Every day | ORAL | Status: DC
  Administered 2020-05-01 – 2020-05-03 (×3): 60 mg via ORAL
  Filled 2020-04-30 (×3): qty 3

## 2020-04-30 MED ORDER — BENZONATATE 100 MG PO CAPS
200.0000 mg | ORAL_CAPSULE | Freq: Three times a day (TID) | ORAL | Status: DC
  Administered 2020-04-30 – 2020-05-02 (×6): 200 mg via ORAL
  Filled 2020-04-30 (×7): qty 2

## 2020-04-30 MED ORDER — SODIUM CHLORIDE 0.9 % IV SOLN
100.0000 mg | Freq: Once | INTRAVENOUS | Status: AC
  Administered 2020-04-30: 100 mg via INTRAVENOUS
  Filled 2020-04-30: qty 20

## 2020-04-30 MED ORDER — DEXAMETHASONE SODIUM PHOSPHATE 10 MG/ML IJ SOLN
10.0000 mg | Freq: Once | INTRAMUSCULAR | Status: AC
  Administered 2020-04-30: 10 mg via INTRAVENOUS
  Filled 2020-04-30: qty 1

## 2020-04-30 MED ORDER — ENOXAPARIN SODIUM 40 MG/0.4ML ~~LOC~~ SOLN
40.0000 mg | SUBCUTANEOUS | Status: DC
  Administered 2020-04-30 – 2020-05-02 (×2): 40 mg via SUBCUTANEOUS
  Filled 2020-04-30 (×2): qty 0.4

## 2020-04-30 MED ORDER — INSULIN ASPART 100 UNIT/ML ~~LOC~~ SOLN
0.0000 [IU] | Freq: Every day | SUBCUTANEOUS | Status: DC
  Administered 2020-04-30 – 2020-05-02 (×2): 3 [IU] via SUBCUTANEOUS
  Filled 2020-04-30: qty 0.05

## 2020-04-30 MED ORDER — SODIUM CHLORIDE 0.9 % IV SOLN
100.0000 mg | Freq: Every day | INTRAVENOUS | Status: DC
  Administered 2020-05-01 – 2020-05-03 (×3): 100 mg via INTRAVENOUS
  Filled 2020-04-30 (×3): qty 20

## 2020-04-30 MED ORDER — DIPHENHYDRAMINE HCL 50 MG/ML IJ SOLN: 50.0000 mg | Freq: Once | INTRAMUSCULAR | Status: DC | PRN

## 2020-04-30 MED ORDER — SODIUM CHLORIDE 0.9 % IV SOLN
1000.0000 mL | INTRAVENOUS | Status: DC
  Administered 2020-04-30: 1000 mL via INTRAVENOUS

## 2020-04-30 MED ORDER — ONDANSETRON HCL 4 MG/2ML IJ SOLN: 4.0000 mg | Freq: Four times a day (QID) | INTRAMUSCULAR | Status: DC | PRN

## 2020-04-30 MED ORDER — HYDROCOD POLST-CPM POLST ER 10-8 MG/5ML PO SUER: 5.0000 mL | Freq: Two times a day (BID) | ORAL | Status: DC | PRN

## 2020-04-30 MED ORDER — INSULIN ASPART 100 UNIT/ML ~~LOC~~ SOLN
0.0000 [IU] | Freq: Three times a day (TID) | SUBCUTANEOUS | Status: DC
  Administered 2020-04-30: 15 [IU] via SUBCUTANEOUS
  Administered 2020-05-01 (×2): 7 [IU] via SUBCUTANEOUS
  Administered 2020-05-01: 11 [IU] via SUBCUTANEOUS
  Administered 2020-05-02 (×2): 7 [IU] via SUBCUTANEOUS
  Administered 2020-05-02: 4 [IU] via SUBCUTANEOUS
  Administered 2020-05-03: 7 [IU] via SUBCUTANEOUS
  Filled 2020-04-30: qty 0.2

## 2020-04-30 MED ORDER — INSULIN ASPART 100 UNIT/ML ~~LOC~~ SOLN
6.0000 [IU] | Freq: Three times a day (TID) | SUBCUTANEOUS | Status: DC
  Administered 2020-04-30 – 2020-05-03 (×8): 6 [IU] via SUBCUTANEOUS
  Filled 2020-04-30: qty 0.06

## 2020-04-30 MED ORDER — ALBUTEROL SULFATE HFA 108 (90 BASE) MCG/ACT IN AERS: 2.0000 | INHALATION_SPRAY | RESPIRATORY_TRACT | Status: DC | PRN

## 2020-04-30 MED ORDER — SODIUM CHLORIDE 0.9 % IV BOLUS
500.0000 mL | Freq: Once | INTRAVENOUS | Status: AC
  Administered 2020-04-30: 500 mL via INTRAVENOUS

## 2020-04-30 MED ORDER — SODIUM CHLORIDE 0.9 % IV SOLN: 100.0000 mg | Freq: Every day | INTRAVENOUS | Status: DC

## 2020-04-30 MED ORDER — INSULIN DETEMIR 100 UNIT/ML ~~LOC~~ SOLN
10.0000 [IU] | Freq: Two times a day (BID) | SUBCUTANEOUS | Status: DC
  Administered 2020-04-30 – 2020-05-03 (×4): 10 [IU] via SUBCUTANEOUS
  Filled 2020-04-30 (×8): qty 0.1

## 2020-04-30 MED ORDER — TRAMADOL HCL 50 MG PO TABS: 50.0000 mg | ORAL_TABLET | Freq: Four times a day (QID) | ORAL | Status: DC | PRN

## 2020-04-30 MED ORDER — SODIUM CHLORIDE 0.9 % IV SOLN: INTRAVENOUS | Status: DC | PRN

## 2020-04-30 MED ORDER — POLYETHYLENE GLYCOL 3350 17 G PO PACK: 17.0000 g | PACK | Freq: Every day | ORAL | Status: DC | PRN

## 2020-04-30 MED ORDER — METHYLPREDNISOLONE SODIUM SUCC 125 MG IJ SOLR: 125.0000 mg | Freq: Once | INTRAMUSCULAR | Status: DC | PRN

## 2020-04-30 MED ORDER — ALBUTEROL SULFATE HFA 108 (90 BASE) MCG/ACT IN AERS: 2.0000 | INHALATION_SPRAY | Freq: Once | RESPIRATORY_TRACT | Status: DC | PRN

## 2020-04-30 MED ORDER — EPINEPHRINE 0.3 MG/0.3ML IJ SOAJ: 0.3000 mg | Freq: Once | INTRAMUSCULAR | Status: DC | PRN

## 2020-04-30 MED ORDER — ASPIRIN EC 81 MG PO TBEC
81.0000 mg | DELAYED_RELEASE_TABLET | Freq: Every morning | ORAL | Status: DC
  Administered 2020-05-01 – 2020-05-03 (×3): 81 mg via ORAL
  Filled 2020-04-30 (×3): qty 1

## 2020-04-30 NOTE — ED Notes (Signed)
Date and time results received: 04/30/20 2:05 PM  (use smartphrase ".now" to insert current time)  Test: Lactic Critical Value: 2.2  Name of Provider Notified: Gertie Fey PA  Orders Received? Or Actions Taken?: Orders Received - See Orders for details

## 2020-04-30 NOTE — Discharge Instructions (Signed)
10 Things You Can Do to Manage Your COVID-19 Symptoms at Home If you have possible or confirmed COVID-19: 1. Stay home from work and school. And stay away from other public places. If you must go out, avoid using any kind of public transportation, ridesharing, or taxis. 2. Monitor your symptoms carefully. If your symptoms get worse, call your healthcare provider immediately. 3. Get rest and stay hydrated. 4. If you have a medical appointment, call the healthcare provider ahead of time and tell them that you have or may have COVID-19. 5. For medical emergencies, call 911 and notify the dispatch personnel that you have or may have COVID-19. 6. Cover your cough and sneezes with a tissue or use the inside of your elbow. 7. Wash your hands often with soap and water for at least 20 seconds or clean your hands with an alcohol-based hand sanitizer that contains at least 60% alcohol. 8. As much as possible, stay in a specific room and away from other people in your home. Also, you should use a separate bathroom, if available. If you need to be around other people in or outside of the home, wear a mask. 9. Avoid sharing personal items with other people in your household, like dishes, towels, and bedding. 10. Clean all surfaces that are touched often, like counters, tabletops, and doorknobs. Use household cleaning sprays or wipes according to the label instructions. cdc.gov/coronavirus 03/20/2019 This information is not intended to replace advice given to you by your health care provider. Make sure you discuss any questions you have with your health care provider. Document Revised: 08/22/2019 Document Reviewed: 08/22/2019 Elsevier Patient Education  2020 Elsevier Inc.  

## 2020-04-30 NOTE — H&P (Signed)
History and Physical  Gregory Jacobs ZGY:[id number] DOB: [birth year] DOA: 04/30/2020  PCP: Unk Pinto, MD Patient coming from: Infusion clinic  I have personally briefly reviewed patient's old medical records in [place]   Chief Complaint: Hypoxia  HPI: Gregory Jacobs is a 49 y.o. male past medical history significant for hypertension and diabetes who has been recently discharged from the hospital on 04/28/2020 during this time he was treated for SARS-CoV-2 pneumonia with IV steroids and remdesivir by the third day he was weaned to room air and was discharged home to continue prednisone and outpatient treatment at the infusion clinic with IV remdesivir, in the clinic he was found to be hypoxic in the 80s, when he was discharged home he was satting greater than 92% with ambulation on room air. He relates he has been coughing and every time especially in the morning he is having large copious amount of sputum production he denies any fever but has been feeling winded when he coughs. In the ED: He is found to be hypoxic satting 86 he was placed on 3 L of oxygen and saturations improved, afebrile with a white count of 13 likely due to steroids, 4.6 (when discharged from the hospital 2.2), blood glucose is 263 ferritin of 1014 (when discharged from the hospital 943), procalcitonin is less than 0.1 chest x-ray showed bilateral infiltrate worse on the right.   Review of Systems: All systems reviewed and apart from history of presenting illness, are negative.  Past Medical History:  Diagnosis Date  . Chronic kidney disease   . Diabetes mellitus without complication ([place]) 5916  . Fatty liver   . Hypertension 2012   Past Surgical History:  Procedure Laterality Date  . CHOLECYSTECTOMY, LAPAROSCOPIC  2011  . VASECTOMY  2016   Social History:  reports that he quit smoking about 6 years ago. His smoking use included cigarettes. He started smoking about 25 years ago. He smoked 0.50  packs per day. His smokeless tobacco use includes chew. He reports that he does not use drugs. No history on file for alcohol use.   No Known Allergies  Family History  Problem Relation Age of Onset  . Breast cancer Mother   . Hypertension Father   . Heart attack Father 69  . Suicidality Sister 37  . Breast cancer Maternal Aunt 60  . Diabetes Paternal Uncle     Prior to Admission medications   Medication Sig Start Date End Date Taking? Authorizing Provider  albuterol (VENTOLIN HFA) 108 (90 Base) MCG/ACT inhaler Inhale 2 puffs into the lungs every 4 (four) hours as needed for wheezing or shortness of breath. 04/28/20   Ghimire, Henreitta Leber, MD  APPLE CIDER VINEGAR PO Take 2 capsules by mouth daily.     [provider]  ascorbic acid (VITAMIN C) 500 MG tablet Take 500-1,000 mg by mouth daily.    [provider]  aspirin EC 81 MG tablet Take 81 mg by mouth in the morning. Swallow whole.    [provider]  benzonatate (TESSALON) 200 MG capsule Take 1 capsule (200 mg total) by mouth 3 (three) times daily. 04/28/20   Ghimire, Henreitta Leber, MD  chlorpheniramine-HYDROcodone (TUSSIONEX) 10-8 MG/5ML SUER Take 5 mLs by mouth every 12 (twelve) hours as needed for cough. 04/28/20   Ghimire, Henreitta Leber, MD  Cholecalciferol (D3-50) 1.25 MG (50000 UT) capsule Take 1 tablet 3 x /week on Mon Wed & Fri for Severe Vitamin D Deficiency Patient taking differently: Take  50,000 Units by mouth every 3 (three) days.  02/07/20   Liane Comber, NP  Cinnamon 500 MG capsule Take 500 mg by mouth 2 (two) times daily.     [provider]  Cyanocobalamin (VITAMIN B-12) 5000 MCG SUBL Place 5,000 mcg under the tongue daily.    [provider]  fexofenadine (ALLEGRA) 180 MG tablet TAKE 1 TABLET BY MOUTH EVERY DAY Patient taking differently: Take 180 mg by mouth daily as needed for allergies or rhinitis.  03/17/20   Liane Comber, NP  insulin NPH-regular Human (NOVOLIN 70/30) (70-30)  100 UNIT/ML injection 10-20 units 2 to 3 x /day as Directed 04/28/20   Unk Pinto, MD  lisinopril (ZESTRIL) 5 MG tablet TAKE 1 TABLET DAILY FOR BLOOD PRESSURE Patient taking differently: Take 5 mg by mouth daily.  04/15/20   Liane Comber, NP  metFORMIN (GLUCOPHAGE-XR) 500 MG 24 hr tablet Take 2 tablets 2 x /day with Meals for Diabetes Patient taking differently: Take 1,000 mg by mouth 2 (two) times daily.  07/08/19   Unk Pinto, MD  nitroGLYCERIN (NITROSTAT) 0.4 MG SL tablet Place 1 tablet (0.4 mg total) under the tongue every 5 (five) minutes as needed for chest pain. 05/16/19 05/15/20  Garnet Sierras, NP  predniSONE (DELTASONE) 10 MG tablet Take 4 tablets (40 mg) daily for 2 days, then, Take 3 tablets (30 mg) daily for 2 days, then, Take 2 tablets (20 mg) daily for 2 days, then, Take 1 tablets (10 mg) daily for 1 days, then stop 04/28/20   Ghimire, Henreitta Leber, MD  traMADol (ULTRAM) 50 MG tablet Take 1 tablet (50 mg total) by mouth every 6 (six) hours as needed. Patient taking differently: Take 50 mg by mouth every 6 (six) hours as needed (for pain).  04/21/20 04/21/21  Garnet Sierras, NP  VITAMIN E PO Take 1 capsule by mouth daily.    [provider]  zinc gluconate 50 MG tablet Take 200 mg by mouth daily.     [provider]   Physical Exam: Vitals:   04/30/20 1308 04/30/20 1310 04/30/20 1330  BP:  110/69 112/70  Pulse:  81 73  Resp:  (!) 21 (!) 21  Temp:  98.3 F (36.8 C)   TempSrc:  Oral   SpO2:  (!) 89% 92%  Weight: 98.3 kg    Height: 6' 2.5" (1.892 m)       General exam: Moderately built and nourished patient, lying comfortably supine on the gurney in no obvious distress.  Head, eyes and ENT: Nontraumatic and normocephalic. Pupils equally reacting to light and accommodation. Oral mucosa moist.  Neck: Supple. No JVD, carotid bruit or thyromegaly.  Lymphatics: No lymphadenopathy.  Respiratory system: Good air movement with diffuse crackles  bilaterally.  Cardiovascular system: S1 and S2 heard, RRR. No JVD, murmurs, gallops, clicks or pedal edema.  Gastrointestinal system: Abdomen is nondistended, soft and nontender. Normal bowel sounds heard. No organomegaly or masses appreciated.  Central nervous system: Alert and oriented. No focal neurological deficits.  Extremities: Symmetric 5 x 5 power. Peripheral pulses symmetrically felt.   Skin: No rashes or acute findings.  Musculoskeletal system: Negative exam.  Psychiatry: Pleasant and cooperative.   Labs on Admission:  Basic Metabolic Panel: Recent Labs  Lab 04/26/20 0834 04/27/20 0157 04/28/20 0710 04/30/20 1320  NA 134* 137 138 131*  K 3.9 3.7 4.7 4.0  CL 99 102 100 100  CO2 23 23 23 23   GLUCOSE 194* 287* 275* 263*  BUN 17  21* 21* 22*  CREATININE 0.92 0.96 0.87 0.89  CALCIUM 8.8* 9.4 9.3 8.2*   Liver Function Tests: Recent Labs  Lab 04/26/20 0834 04/27/20 0157 04/28/20 0710 04/30/20 1320  AST 36 28 35 54*  ALT 84* 69* 72* 111*  ALKPHOS 55 55 45 55  BILITOT 0.8 0.5 0.9 1.3*  PROT 6.2* 6.1* 6.1* 6.3*  ALBUMIN 2.9* 2.7* 2.5* 2.9*   No results for input(s): LIPASE, AMYLASE in the last 168 hours. No results for input(s): AMMONIA in the last 168 hours. CBC: Recent Labs  Lab 04/26/20 0834 04/27/20 0157 04/28/20 0710 04/30/20 1320  WBC 13.5* 8.7 7.4 13.6*  NEUTROABS 11.5* 7.2 6.4 11.7*  HGB 16.4 15.8 14.9 15.0  HCT 46.8 46.6 43.9 43.6  MCV 85.7 87.4 86.6 87.4  PLT 161 137* 163 159   Cardiac Enzymes: No results for input(s): CKTOTAL, CKMB, CKMBINDEX, TROPONINI in the last 168 hours.  BNP (last 3 results) No results for input(s): PROBNP in the last 8760 hours. CBG: Recent Labs  Lab 04/27/20 1142 04/27/20 1606 04/27/20 2117 04/28/20 0741 04/28/20 1117  GLUCAP 222* 269* 228* 243* 276*    Radiological Exams on Admission: DG Chest Port 1 View  Result Date: 04/30/2020 CLINICAL DATA:  Hypoxia.  COVID positive. EXAM: PORTABLE CHEST 1 VIEW  COMPARISON:  Chest x-rays dated 04/26/2020 and 04/24/2020 FINDINGS: Slightly denser opacity within the LEFT perihilar lung. Persistent patchy opacities along the periphery of the LEFT lung, consistent with pneumonia. Additional pneumonia versus scarring at the RIGHT lung base. No pleural effusion or pneumothorax is seen. Heart size and mediastinal contours are within normal limits. Osseous structures about the chest are unremarkable. IMPRESSION: 1. Multifocal pneumonia, slightly denser within the LEFT perihilar lung suggesting worsening. 2. Additional pneumonia versus scarring at the RIGHT lung base. Electronically Signed   By: Franki Cabot M.D.   On: 04/30/2020 14:33    EKG: Independently reviewed.  Sinus rhythm right axis PR interval will 200, no T wave abnormalities.  Assessment/Plan Acute respiratory failure due to COVID-19 viral pneumonia: Hypoxic with ambulation here in the ED, will retreat him with IV remdesivir and steroids. His procalcitonin is less than 0.1, has a mild leukocytosis of 13 with a left shift likely due to steroids, he has remained afebrile, chest x-ray shows worsening infiltrate but he has remained afebrile will not start antibiotic at this point.  We will continue to monitor closely recheck CBC tomorrow morning. Out of bed to chair, try to keep the patient prone for Lasix now sedated not prone out of bed to chair, continue symptom spirometry and flutter valve.  Hypovolemic hyponatremia: Start him on IV fluids recheck in the morning.  Elevated bilirubin/elevated LFTs: He denies any abdominal pain nausea or vomiting there trending up compared to previous admission question due to COVID-19, check an abdominal ultrasound and acute hepatitis panel.  Essential hypertension Blood pressure is borderline so hold lisinopril continue to follow closely.  Hyperlipidemia associated with type 2 diabetes mellitus ([place]) We will continue statins, he will be on steroids which will make  his blood glucose erratic, long-acting insulin plus sliding scale insulin. CBGs before meals and at bedtime his last A1c was 6.4.  Stage 2 chronic kidney disease due to type 2 diabetes mellitus ([place]) At baseline noted.    DVT Prophylaxis: lovenxo Code Status: full  Family Communication: none  Disposition Plan:  inpatient     It is my clinical opinion that admission to INPATIENT is reasonable and necessary in this 49  y.o. male past medical history of essential hypertension diabetes mellitus recently treated for COVID-19, came into the hospital was found to be hypoxic in the 80s placed on 2 L of oxygen and saturations improved to 94%, with rhonchi and crackles bilaterally, will start him on IV remdesivir and steroids.  Given the aforementioned, the predictability of an adverse outcome is felt to be significant. I expect that the patient will require at least 2 midnights in the hospital to treat this condition.  Charlynne Cousins MD [facility]   04/30/2020, 2:58 PM

## 2020-04-30 NOTE — Progress Notes (Signed)
1100 Pt arrived for remdesivir infusion, vital signs taken, O2 on finger probe reading 83-85% with good pleth. Pt SOB while speaking and on exertion. 4L-5L O2 applied and pt now at 92%.  Pt was seen at clinic yesterday and O2 was around 88%, with coaching and deep breathing O2 at 90%, but pt would desat with speech, coughing, exertion. Pt looking progressively worse today than yesterday. RN called Perrin Maltese, NP. RN informed to take to ED for evaluation after remdesivir infusion given since pt is stable on 4-5L of O2 via [place].   Pt informed RN that O2 reading on ear was 100% in hospital. RN attempted earlobe but not a good pleth. Pt stated his home O2 probe reading 83%-95% yesterday and throughout night. Pt reported feeling dizzy and light headed this morning.   1130 IV started and remdesivir infusion given. RN called rapid response RN to transport to ED.

## 2020-04-30 NOTE — ED Notes (Signed)
Date and time results received: 04/30/20 4:33 PM  (use smartphrase ".now" to insert current time)  Test: Lactic acid Critical Value: 2.2  Name of Provider Notified: Nurse [place]  Orders Received? Or Actions Taken?:

## 2020-04-30 NOTE — ED Provider Notes (Signed)
[facility] DEPT Provider Note   CSN: [id number] Arrival date & time: 04/30/20  1234     History Chief Complaint  Patient presents with  . hypoxia  . COVID positive    Josephine Rudnick is a 49 y.o. male.  The history is provided by the patient and medical records. No language interpreter was used.     49 year old male with history of hypertension, diabetes, obesity, sent here from the Covid infection infusion clinic for shortness of breath.  Patient is unvaccinated, tested positive for COVID-19 on 7/29, subsequently admitted to the hospital secondary to Covid pneumonia.  He has had Covid symptoms for 2 weeks.  Symptoms include fever, chills, myalgias, and worsened shortness of breath.  He received steroid, and remdesivir once hospitalized.  Patient states he has had an indolent course of infection which wax and wane.  Symptoms including fever, chills, body aches, decrease in appetite, loss of taste and smell, having recurrent diarrhea.  Also endorsed having some ribs and abdominal pain from persistent coughing.  He has had a total of 5 remdesivir treatments including the last one today.  He was hospitalized for 2 days several days ago.  Subsequently follow-up at the infusion center for his remdesivir.  However, today at the infusion center, his oxygen level was 84% on room air prompting this ER visit.    Past Medical History:  Diagnosis Date  . Chronic kidney disease   . Diabetes mellitus without complication ([place]) 6237  . Fatty liver   . Hypertension 2012    Patient Active Problem List   Diagnosis Date Noted  . Acute respiratory failure with hypoxia ([place])   . Hypoxia 04/26/2020  . Pneumonia due to COVID-19 virus 04/24/2020  . COVID-19 (04/16/2020) 04/17/2020  . Aortic atherosclerosis ([place]) 01/16/2020  . Pityriasis rosea 06/25/2018  . Abnormal EKG 06/25/2018  . Prostate nodule 06/22/2018  . Stage 2 chronic kidney disease due to type 2 diabetes  mellitus ([place]) 06/22/2018  . Overweight (BMI 25.0-29.9) 02/08/2018  . Essential hypertension 08/25/2017  . Hyperlipidemia associated with type 2 diabetes mellitus ([place]) 08/25/2017  . Type 2 diabetes mellitus with kidney complication ([place]) 62/83/1517  . Vitamin D deficiency 08/25/2017  . Fatty liver     Past Surgical History:  Procedure Laterality Date  . CHOLECYSTECTOMY, LAPAROSCOPIC  2011  . VASECTOMY  2016       Family History  Problem Relation Age of Onset  . Breast cancer Mother   . Hypertension Father   . Heart attack Father 54  . Suicidality Sister 31  . Breast cancer Maternal Aunt 60  . Diabetes Paternal Uncle     Social History   Tobacco Use  . Smoking status: Former Smoker    Packs/day: 0.50    Types: Cigarettes    Start date: 1996    Quit date: 2015    Years since quitting: 6.6  . Smokeless tobacco: Current User    Types: Chew  Vaping Use  . Vaping Use: Never used  Substance Use Topics  . Alcohol use: Not on file    Comment: rarely  . Drug use: No    Home Medications Prior to Admission medications   Medication Sig Start Date End Date Taking? Authorizing Provider  albuterol (VENTOLIN HFA) 108 (90 Base) MCG/ACT inhaler Inhale 2 puffs into the lungs every 4 (four) hours as needed for wheezing or shortness of breath. 04/28/20   Ghimire, Henreitta Leber, MD  APPLE CIDER VINEGAR PO Take 2 capsules  by mouth daily.     [provider]  ascorbic acid (VITAMIN C) 500 MG tablet Take 500-1,000 mg by mouth daily.    [provider]  aspirin EC 81 MG tablet Take 81 mg by mouth in the morning. Swallow whole.    [provider]  benzonatate (TESSALON) 200 MG capsule Take 1 capsule (200 mg total) by mouth 3 (three) times daily. 04/28/20   Ghimire, Henreitta Leber, MD  chlorpheniramine-HYDROcodone (TUSSIONEX) 10-8 MG/5ML SUER Take 5 mLs by mouth every 12 (twelve) hours as needed for cough. 04/28/20   Ghimire, Henreitta Leber, MD  Cholecalciferol (D3-50) 1.25 MG  (50000 UT) capsule Take 1 tablet 3 x /week on Mon Wed & Fri for Severe Vitamin D Deficiency Patient taking differently: Take 50,000 Units by mouth every 3 (three) days.  02/07/20   Liane Comber, NP  Cinnamon 500 MG capsule Take 500 mg by mouth 2 (two) times daily.     [provider]  Cyanocobalamin (VITAMIN B-12) 5000 MCG SUBL Place 5,000 mcg under the tongue daily.    [provider]  fexofenadine (ALLEGRA) 180 MG tablet TAKE 1 TABLET BY MOUTH EVERY DAY Patient taking differently: Take 180 mg by mouth daily as needed for allergies or rhinitis.  03/17/20   Liane Comber, NP  insulin NPH-regular Human (NOVOLIN 70/30) (70-30) 100 UNIT/ML injection 10-20 units 2 to 3 x /day as Directed 04/28/20   Unk Pinto, MD  lisinopril (ZESTRIL) 5 MG tablet TAKE 1 TABLET DAILY FOR BLOOD PRESSURE Patient taking differently: Take 5 mg by mouth daily.  04/15/20   Liane Comber, NP  metFORMIN (GLUCOPHAGE-XR) 500 MG 24 hr tablet Take 2 tablets 2 x /day with Meals for Diabetes Patient taking differently: Take 1,000 mg by mouth 2 (two) times daily.  07/08/19   Unk Pinto, MD  nitroGLYCERIN (NITROSTAT) 0.4 MG SL tablet Place 1 tablet (0.4 mg total) under the tongue every 5 (five) minutes as needed for chest pain. 05/16/19 05/15/20  Garnet Sierras, NP  predniSONE (DELTASONE) 10 MG tablet Take 4 tablets (40 mg) daily for 2 days, then, Take 3 tablets (30 mg) daily for 2 days, then, Take 2 tablets (20 mg) daily for 2 days, then, Take 1 tablets (10 mg) daily for 1 days, then stop 04/28/20   Ghimire, Henreitta Leber, MD  traMADol (ULTRAM) 50 MG tablet Take 1 tablet (50 mg total) by mouth every 6 (six) hours as needed. Patient taking differently: Take 50 mg by mouth every 6 (six) hours as needed (for pain).  04/21/20 04/21/21  Garnet Sierras, NP  VITAMIN E PO Take 1 capsule by mouth daily.    [provider]  zinc gluconate 50 MG tablet Take 200 mg by mouth daily.     [provider]     Allergies    Patient has no known allergies.  Review of Systems   Review of Systems  All other systems reviewed and are negative.   Physical Exam Updated Vital Signs BP 110/69 (BP Location: Left Arm)   Pulse 81   Temp 98.3 F (36.8 C) (Oral)   Resp (!) 21   Ht 6' 2.5" (1.892 m)   Wt 98.3 kg   SpO2 (!) 89%   BMI 27.46 kg/m   Physical Exam Vitals and nursing note reviewed.  Constitutional:      General: He is not in acute distress.    Appearance: He is well-developed.  HENT:     Head: Atraumatic.  Eyes:  Conjunctiva/sclera: Conjunctivae normal.  Cardiovascular:     Rate and Rhythm: Normal rate and regular rhythm.     Pulses: Normal pulses.     Heart sounds: Normal heart sounds.  Pulmonary:     Effort: Pulmonary effort is normal.     Breath sounds: Rhonchi present. No wheezing.  Abdominal:     Palpations: Abdomen is soft.     Tenderness: There is no abdominal tenderness.  Musculoskeletal:        General: No swelling.     Cervical back: Neck supple.  Skin:    General: Skin is warm.     Findings: No rash.  Neurological:     Mental Status: He is alert and oriented to person, place, and time.  Psychiatric:        Mood and Affect: Mood normal.     ED Results / Procedures / Treatments   Labs (all labs ordered are listed, but only abnormal results are displayed) Labs Reviewed  LACTIC ACID, PLASMA - Abnormal; Notable for the following components:      Result Value   Lactic Acid, Venous 2.2 (*)    All other components within normal limits  CBC WITH DIFFERENTIAL/PLATELET - Abnormal; Notable for the following components:   WBC 13.6 (*)    Neutro Abs 11.7 (*)    Monocytes Absolute 1.1 (*)    Abs Immature Granulocytes 0.09 (*)    All other components within normal limits  COMPREHENSIVE METABOLIC PANEL - Abnormal; Notable for the following components:   Sodium 131 (*)    Glucose, Bld 263 (*)    BUN 22 (*)    Calcium 8.2 (*)    Total Protein 6.3 (*)     Albumin 2.9 (*)    AST 54 (*)    ALT 111 (*)    Total Bilirubin 1.3 (*)    All other components within normal limits  FERRITIN - Abnormal; Notable for the following components:   Ferritin 1,014 (*)    All other components within normal limits  FIBRINOGEN - Abnormal; Notable for the following components:   Fibrinogen 567 (*)    All other components within normal limits  C-REACTIVE PROTEIN - Abnormal; Notable for the following components:   CRP 4.6 (*)    All other components within normal limits  CULTURE, BLOOD (ROUTINE X 2)  CULTURE, BLOOD (ROUTINE X 2)  D-DIMER, QUANTITATIVE (NOT AT [facility])  PROCALCITONIN  LACTATE DEHYDROGENASE  TRIGLYCERIDES  LACTIC ACID, PLASMA    EKG None  ED ECG REPORT   Date: 04/30/2020  Rate: 77  Rhythm: normal sinus rhythm  QRS Axis: right  Intervals: PR shortened  ST/T Wave abnormalities: normal  Conduction Disutrbances:none  Narrative Interpretation:   Old EKG Reviewed: unchanged  I have personally reviewed the EKG tracing and agree with the computerized printout as noted.   Radiology DG Chest Port 1 View  Result Date: 04/30/2020 CLINICAL DATA:  Hypoxia.  COVID positive. EXAM: PORTABLE CHEST 1 VIEW COMPARISON:  Chest x-rays dated 04/26/2020 and 04/24/2020 FINDINGS: Slightly denser opacity within the LEFT perihilar lung. Persistent patchy opacities along the periphery of the LEFT lung, consistent with pneumonia. Additional pneumonia versus scarring at the RIGHT lung base. No pleural effusion or pneumothorax is seen. Heart size and mediastinal contours are within normal limits. Osseous structures about the chest are unremarkable. IMPRESSION: 1. Multifocal pneumonia, slightly denser within the LEFT perihilar lung suggesting worsening. 2. Additional pneumonia versus scarring at the RIGHT lung base. Electronically Signed  By: Franki Cabot M.D.   On: 04/30/2020 14:33    Procedures .Critical Care Performed by: Domenic Moras, PA-C Authorized by: Domenic Moras, PA-C   Critical care provider statement:    Critical care time (minutes):  32   Critical care was time spent personally by me on the following activities:  Discussions with consultants, evaluation of patient's response to treatment, examination of patient, ordering and performing treatments and interventions, ordering and review of laboratory studies, ordering and review of radiographic studies, pulse oximetry, re-evaluation of patient's condition, obtaining history from patient or surrogate and review of old charts   (including critical care time)  Medications Ordered in ED Medications  0.9 %  sodium chloride infusion (1,000 mLs Intravenous New Bag/Given 04/30/20 1415)  dexamethasone (DECADRON) injection 10 mg (has no administration in time range)    ED Course  I have reviewed the triage vital signs and the nursing notes.  Pertinent labs & imaging results that were available during my care of the patient were reviewed by me and considered in my medical decision making (see chart for details).    MDM Rules/Calculators/A&P                          BP 112/70 (BP Location: Left Arm)   Pulse 73   Temp 98.3 F (36.8 C) (Oral)   Resp (!) 21   Ht 6' 2.5" (1.892 m)   Wt 98.3 kg   SpO2 92%   BMI 27.46 kg/m   Final Clinical Impression(s) / ED Diagnoses Final diagnoses:  Acute hypoxemic respiratory failure due to COVID-19 [facility])    Rx / DC Orders ED Discharge Orders    None     2:42 PM Patient here with Covid symptoms for approximately 2 weeks.  Was hospitalized several days ago for worsening shortness of breath and was treated in the hospital for 2 days, discharged home to follow-up with infusion clinic for remdesivir treatment.  He has had a total of 5 separate remdesivir treatment but presenting today with hypoxia with an initial O2 sats in the 80s.  Here, his O2 sats was 89% improved to 90-93% on supplemental oxygen.  He is symptomatic with exertion.  Chest x-ray obtained  showing multifocal pneumonia, slightly denser within the left perihilar lung suggesting worsening.  Additional pneumonia versus scarring at the right lung base.  Inflammatory markers are elevated.  Normal procalcitonin.  Lactic acid is 2.2.  Patient placed on supplemental oxygen, appreciate consultation from [facility] hospitalist, Dr. Venetia Constable who agrees to see and admit patient.  He recommend starting patient on remdesivir and Decadron.  Care discussed with Dr. Roderic Palau.    Vaiden Adames was evaluated in Emergency Department on 04/30/2020 for the symptoms described in the history of present illness. He was evaluated in the context of the global COVID-19 pandemic, which necessitated consideration that the patient might be at risk for infection with the SARS-CoV-2 virus that causes COVID-19. Institutional protocols and algorithms that pertain to the evaluation of patients at risk for COVID-19 are in a state of rapid change based on information released by regulatory bodies including the CDC and federal and state organizations. These policies and algorithms were followed during the patient's care in the ED.    Domenic Moras, PA-C 04/30/20 1445    Milton Ferguson, MD 05/01/20 [phone]

## 2020-04-30 NOTE — ED Triage Notes (Signed)
Sent from infusion clinic, stated his oxygen was in the low 80s on RA, pateint completed his infusion and they brought him over. Endorses R sided rib/abdominal pain with coughing. Alert and oriented.

## 2020-05-01 LAB — CULTURE, BLOOD (ROUTINE X 2)
Culture: NO GROWTH
Culture: NO GROWTH
Special Requests: ADEQUATE

## 2020-05-01 LAB — CBC WITH DIFFERENTIAL/PLATELET
Abs Immature Granulocytes: 0.06 10*3/uL (ref 0.00–0.07)
Basophils Absolute: 0 10*3/uL (ref 0.0–0.1)
Basophils Relative: 0 %
Eosinophils Absolute: 0 10*3/uL (ref 0.0–0.5)
Eosinophils Relative: 0 %
HCT: 43.4 % (ref 39.0–52.0)
Hemoglobin: 14.8 g/dL (ref 13.0–17.0)
Immature Granulocytes: 1 %
Lymphocytes Relative: 9 %
Lymphs Abs: 0.8 10*3/uL (ref 0.7–4.0)
MCH: 29.8 pg (ref 26.0–34.0)
MCHC: 34.1 g/dL (ref 30.0–36.0)
MCV: 87.5 fL (ref 80.0–100.0)
Monocytes Absolute: 0.4 10*3/uL (ref 0.1–1.0)
Monocytes Relative: 4 %
Neutro Abs: 7.7 10*3/uL (ref 1.7–7.7)
Neutrophils Relative %: 86 %
Platelets: 149 10*3/uL — ABNORMAL LOW (ref 150–400)
RBC: 4.96 MIL/uL (ref 4.22–5.81)
RDW: 11.5 % (ref 11.5–15.5)
WBC: 9 10*3/uL (ref 4.0–10.5)
nRBC: 0 % (ref 0.0–0.2)

## 2020-05-01 LAB — C-REACTIVE PROTEIN: CRP: 7.3 mg/dL — ABNORMAL HIGH (ref <1.0)

## 2020-05-01 LAB — COMPREHENSIVE METABOLIC PANEL
ALT: 87 U/L — ABNORMAL HIGH (ref 0–44)
AST: 23 U/L (ref 15–41)
Albumin: 2.6 g/dL — ABNORMAL LOW (ref 3.5–5.0)
Alkaline Phosphatase: 47 U/L (ref 38–126)
Anion gap: 9 (ref 5–15)
BUN: 24 mg/dL — ABNORMAL HIGH (ref 6–20)
CO2: 22 mmol/L (ref 22–32)
Calcium: 8.9 mg/dL (ref 8.9–10.3)
Chloride: 103 mmol/L (ref 98–111)
Creatinine, Ser: 0.74 mg/dL (ref 0.61–1.24)
GFR calc Af Amer: >60 mL/min (ref >60)
GFR calc non Af Amer: >60 mL/min (ref >60)
Glucose, Bld: 289 mg/dL — ABNORMAL HIGH (ref 70–99)
Potassium: 4.4 mmol/L (ref 3.5–5.1)
Sodium: 134 mmol/L — ABNORMAL LOW (ref 135–145)
Total Bilirubin: 0.9 mg/dL (ref 0.3–1.2)
Total Protein: 6 g/dL — ABNORMAL LOW (ref 6.5–8.1)

## 2020-05-01 LAB — D-DIMER, QUANTITATIVE: D-Dimer, Quant: 0.75 ug/mL-FEU — ABNORMAL HIGH (ref 0.00–0.50)

## 2020-05-01 LAB — CBG MONITORING, ED
Glucose-Capillary: 263 mg/dL — ABNORMAL HIGH (ref 70–99)
Glucose-Capillary: 219 mg/dL — ABNORMAL HIGH (ref 70–99)

## 2020-05-01 MED ORDER — SODIUM CHLORIDE 0.9 % IV SOLN: Freq: Once | INTRAVENOUS | Status: AC

## 2020-05-01 NOTE — Progress Notes (Signed)
PROGRESS NOTE    Gregory Jacobs  ZOX:[id number] DOB: [birth year] DOA: 04/30/2020 PCP: Unk Pinto, MD   Brief Narrative:  Gregory Jacobs is a 49 y.o. male past medical history significant for hypertension and diabetes who has been recently discharged from the hospital on 04/28/2020 during this time he was treated for SARS-CoV-2 pneumonia with IV steroids and remdesivir by the third day he was weaned to room air and was discharged home to continue prednisone and outpatient treatment at the infusion clinic with IV remdesivir, in the clinic he was found to be hypoxic in the 80s, when he was discharged home he was satting greater than 92% with ambulation on room air. He relates he has been coughing and every time especially in the morning he is having large copious amount of sputum production he denies any fever but has been feeling winded when he coughs. In the ED: He is found to be hypoxic satting 86 he was placed on 3 L of oxygen and saturations improved, afebrile with a white count of 13 likely due to steroids, 4.6 (when discharged from the hospital 2.2), blood glucose is 263 ferritin of 1014 (when discharged from the hospital 943), procalcitonin is less than 0.1 chest x-ray showed bilateral infiltrate worse on the right   Assessment & Plan:   Active Problems:   Essential hypertension   Hyperlipidemia associated with type 2 diabetes mellitus ([place])   Stage 2 chronic kidney disease due to type 2 diabetes mellitus ([place])   Pneumonia due to COVID-19 virus   Acute respiratory failure with hypoxia (HCC)   Acute, recurrent respiratory failure due to COVID-19 viral pneumonia, POA: Chest x-ray shows minimally progressive disease on the right lower lobe and left lower lobe opacifications without overt multifocal bilateral opacifications usually consistent with Covid.   We will restart IV remdesivir and steroid course given concern for failure of previous course. Procalcitonin negative, minimal  leukocytosis in the setting of steroids, afebrile Unlikely bacterial infection at this point although post viral pneumonia would correlate to this timeframe Currently holding Actemra/barcitinib given minimal symptoms, and prolonged infectious course at this time likely 10 days to 2 weeks out from initial Covid symptoms. Out of bed to chair, try to keep the patient prone for Lasix now sedated not prone out of bed to chair, continue symptom spirometry and flutter valve. SpO2: 90 % O2 Flow Rate (L/min): 3 L/min  Recent Labs    04/30/20 1320 05/01/20 0424  DDIMER 0.49  --   FERRITIN 1,014*  --   LDH 189  --   CRP 4.6* 7.3*    Lab Results  Component Value Date   SARSCOV2NAA POSITIVE (A) 04/26/2020   [place] Not Detected 08/19/2019   Hypovolemic hyponatremia: Start him on IV fluids -follow repeat labs  Elevated bilirubin/elevated LFTs: He denies any abdominal pain nausea or vomiting there trending up compared to previous admission question due to COVID-19, check an abdominal ultrasound and acute hepatitis panel. Hepatic Function Latest Ref Rng & Units 05/01/2020 04/30/2020 04/28/2020  Total Protein 6.5 - 8.1 g/dL 6.0(L) 6.3(L) 6.1(L)  Albumin 3.5 - 5.0 g/dL 2.6(L) 2.9(L) 2.5(L)  AST 15 - 41 U/L 23 54(H) 35  ALT 0 - 44 U/L 87(H) 111(H) 72(H)  Alk Phosphatase 38 - 126 U/L 47 55 45  Total Bilirubin 0.3 - 1.2 mg/dL 0.9 1.3(H) 0.9  Bilirubin, Direct 0.0 - 0.2 mg/dL - - -    Essential hypertension: Blood pressure is borderline so hold lisinopril continue to follow closely.  Hyperlipidemia associated with type 2 diabetes mellitus ([place]) We will continue statins, he will be on steroids which will make his blood glucose erratic, long-acting insulin plus sliding scale insulin. CBGs before meals and at bedtime his last A1c was 6.4.  Stage 2 chronic kidney disease due to type 2 diabetes mellitus ([place]) At baseline noted.    DVT Prophylaxis: lovenxo Code Status: full  Family  Communication: Patient to update  Status is: Inpatient  Dispo: The patient is from: Home              Anticipated d/c is to: Home              Anticipated d/c date is: 48 to 72 hours              Patient currently not medically stable for discharge given ongoing need for further evaluation, IV fluids IV steroids IV Remdesivir in the setting of acute recurrent hypoxia and COVID-19 pneumonia.  Consultants:   None  Procedures:   None planned  Antimicrobials:  Remdesivir  Subjective: No acute issues or events overnight, patient feels markedly improved on supplemental oxygen denies nausea, vomiting, diarrhea, constipation, headache, fevers, chills.  Objective: Vitals:   05/01/20 0427 05/01/20 0430 05/01/20 0700 05/01/20 0730  BP:  109/79 (!) 95/59 91/67  Pulse:  (!) 53 (!) 54 (!) 48  Resp:  18 (!) 22 18  Temp: (!) 97.4 F (36.3 C)     TempSrc: Oral     SpO2:  91% 92% 90%  Weight:      Height:        Intake/Output Summary (Last 24 hours) at 05/01/2020 0804 Last data filed at 04/30/2020 1955 Gross per 24 hour  Intake 648.8 ml  Output --  Net 648.8 ml   Filed Weights   04/30/20 1308  Weight: 98.3 kg    Examination:  General:  Pleasantly resting in bed, No acute distress. HEENT:  Normocephalic atraumatic.  Sclerae nonicteric, noninjected.  Extraocular movements intact bilaterally. Neck:  Without mass or deformity.  Trachea is midline. Lungs: Scant right lower lobe rhonchi otherwise without overt wheeze, or rales. Heart:  Regular rate and rhythm.  Without murmurs, rubs, or gallops. Abdomen:  Soft, nontender, nondistended.  Without guarding or rebound. Extremities: Without cyanosis, clubbing, edema, or obvious deformity. Vascular:  Dorsalis pedis and posterior tibial pulses palpable bilaterally. Skin:  Warm and dry, no erythema, no ulcerations.  Data Reviewed: I have personally reviewed following labs and imaging studies  CBC: Recent Labs  Lab 04/26/20 0834  04/27/20 0157 04/28/20 0710 04/30/20 1320 05/01/20 0424  WBC 13.5* 8.7 7.4 13.6* 9.0  NEUTROABS 11.5* 7.2 6.4 11.7* 7.7  HGB 16.4 15.8 14.9 15.0 14.8  HCT 46.8 46.6 43.9 43.6 43.4  MCV 85.7 87.4 86.6 87.4 87.5  PLT 161 137* 163 159 749*   Basic Metabolic Panel: Recent Labs  Lab 04/26/20 0834 04/27/20 0157 04/28/20 0710 04/30/20 1320 05/01/20 0424  NA 134* 137 138 131* 134*  K 3.9 3.7 4.7 4.0 4.4  CL 99 102 100 100 103  CO2 _0 GLUCOSE 194* 287* 275* 263* 289*  BUN 17 21* 21* 22* 24*  CREATININE 0.92 0.96 0.87 0.89 0.74  CALCIUM 8.8* 9.4 9.3 8.2* 8.9   GFR: Estimated Creatinine Clearance: 130.3 mL/min (by C-G formula based on SCr of 0.74 mg/dL). Liver Function Tests: Recent Labs  Lab 04/26/20 0834 04/27/20 0157 04/28/20 0710 04/30/20 1320 05/01/20 0424  AST 36 28 35  54* 23  ALT 84* 69* 72* 111* 87*  ALKPHOS 55 55 45 55 47  BILITOT 0.8 0.5 0.9 1.3* 0.9  PROT 6.2* 6.1* 6.1* 6.3* 6.0*  ALBUMIN 2.9* 2.7* 2.5* 2.9* 2.6*   No results for input(s): LIPASE, AMYLASE in the last 168 hours. No results for input(s): AMMONIA in the last 168 hours. Coagulation Profile: No results for input(s): INR, PROTIME in the last 168 hours. Cardiac Enzymes: No results for input(s): CKTOTAL, CKMB, CKMBINDEX, TROPONINI in the last 168 hours. BNP (last 3 results) No results for input(s): PROBNP in the last 8760 hours. HbA1C: No results for input(s): HGBA1C in the last 72 hours. CBG: Recent Labs  Lab 04/27/20 2117 04/28/20 0741 04/28/20 1117 04/30/20 1644 04/30/20 2146  GLUCAP 228* 243* 276* 306* 294*   Lipid Profile: Recent Labs    04/30/20 1320  TRIG 81   Thyroid Function Tests: No results for input(s): TSH, T4TOTAL, FREET4, T3FREE, THYROIDAB in the last 72 hours. Anemia Panel: Recent Labs    04/30/20 1320  FERRITIN 1,014*   Sepsis Labs: Recent Labs  Lab 04/26/20 0834 04/26/20 1040 04/30/20 1320 04/30/20 1544  PROCALCITON 0.13  --  <0.10  --     LATICACIDVEN 2.5* 1.6 2.2* 2.2*    Recent Results (from the past 240 hour(s))  Blood Culture (routine x 2)     Status: None (Preliminary result)   Collection Time: 04/26/20  8:30 AM   Specimen: BLOOD  Result Value Ref Jacobs Status   Specimen Description   Final    BLOOD RIGHT ANTECUBITAL Performed at [facility], [place] [address], [place], [place] [address]    Special Requests   Final    BOTTLES DRAWN AEROBIC AND ANAEROBIC Blood Culture adequate volume Performed at [facility], [address] [place], [place] [address]    Culture   Final    NO GROWTH 4 DAYS Performed at [facility], [place] [address] [place], [place] [address]    Report Status PENDING  Incomplete  SARS Coronavirus 2 by RT PCR (hospital order, performed in [facility] hospital lab) Nasopharyngeal Nasopharyngeal Swab     Status: Abnormal   Collection Time: 04/26/20  8:58 AM   Specimen: Nasopharyngeal Swab  Result Value Ref Jacobs Status   SARS Coronavirus 2 POSITIVE (A) NEGATIVE Final    Comment: RESULT CALLED TO, READ BACK BY AND VERIFIED WITH: CALLED TO S.GOUGE AT 1008 ON [id number] BY SROY (NOTE) SARS-CoV-2 target nucleic acids are DETECTED  SARS-CoV-2 RNA is generally detectable in upper respiratory specimens  during the acute phase of infection.  Positive results are indicative  of the presence of the identified virus, but do not rule out bacterial infection or co-infection with other pathogens not detected by the test.  Clinical correlation with patient history and  other diagnostic information is necessary to determine patient infection status.  The expected result is negative.  Fact Sheet for Patients:   StrictlyIdeas.no   Fact Sheet for Healthcare Providers:   BankingDealers.co.za    This test is not yet approved or cleared by the [place] FDA and  has been authorized for detection and/or diagnosis of SARS-CoV-2 by FDA under  an Emergency Use Authorization (EUA).  This EUA will remain in effect (meanin g this test can be used) for the duration of  the COVID-19 declaration under Section 564(b)(1) of the Act, 21 U.S.C. section 360-bbb-3(b)(1), unless the authorization is terminated or revoked sooner.  Performed at  [facility], [address] [place], [place] [address]   Blood Culture (routine x 2)     Status: None (Preliminary result)   Collection Time: 04/26/20  8:59 AM   Specimen: BLOOD  Result Value Ref Jacobs Status   Specimen Description   Final    BLOOD LEFT ANTECUBITAL Performed at [facility], [place] [address], [place],  [address]    Special Requests   Final    BOTTLES DRAWN AEROBIC ONLY Blood Culture results may not be optimal due to an inadequate volume of blood received in culture bottles Performed at [facility], [address] [place], [place] [address]    Culture   Final    NO GROWTH 4 DAYS Performed at [facility], [place] [address], [place],  [address]    Report Status PENDING  Incomplete    Radiology Studies: US Abdomen Limited  Result Date: 04/30/2020 CLINICAL DATA:  Increased LFTs EXAM: ULTRASOUND ABDOMEN LIMITED RIGHT UPPER QUADRANT COMPARISON:  None. FINDINGS: Gallbladder: Cholecystectomy. Common bile duct: Diameter: 4 mm, Liver: Enlarged, measuring 16 cm. No focal lesion identified. Increased parenchymal echogenicity. Portal vein is patent on color Doppler imaging with normal direction of blood flow towards the liver. Other: None. IMPRESSION: Hepatomegaly. Increased liver echogenicity likely reflects steatosis. Electronically Signed   By: Macy Mis M.D.   On: 04/30/2020 17:06   DG Chest Port 1 View  Result Date: 04/30/2020 CLINICAL DATA:  Hypoxia.  COVID positive. EXAM: PORTABLE CHEST 1 VIEW COMPARISON:  Chest x-rays dated 04/26/2020 and 04/24/2020 FINDINGS: Slightly denser opacity within the LEFT perihilar lung. Persistent patchy  opacities along the periphery of the LEFT lung, consistent with pneumonia. Additional pneumonia versus scarring at the RIGHT lung base. No pleural effusion or pneumothorax is seen. Heart size and mediastinal contours are within normal limits. Osseous structures about the chest are unremarkable. IMPRESSION: 1. Multifocal pneumonia, slightly denser within the LEFT perihilar lung suggesting worsening. 2. Additional pneumonia versus scarring at the RIGHT lung base. Electronically Signed   By: Franki Cabot M.D.   On: 04/30/2020 14:33    Scheduled Meds: . aspirin EC  81 mg Oral q AM  . benzonatate  200 mg Oral TID  . enoxaparin (LOVENOX) injection  40 mg Subcutaneous Q24H  . insulin aspart  0-20 Units Subcutaneous TID WC  . insulin aspart  0-5 Units Subcutaneous QHS  . insulin aspart  6 Units Subcutaneous TID WC  . insulin detemir  10 Units Subcutaneous BID  . predniSONE  60 mg Oral Q breakfast   Continuous Infusions: . remdesivir 100 mg in NS 100 mL       LOS: 1 day   Time spent: 39mn  Narayan Scull C Lomax Poehler, DO [facility]  If 7PM-7AM, please contact night-coverage [email or web address]  05/01/2020, 8:04 AM

## 2020-05-01 NOTE — ED Notes (Signed)
Admitting provider at bedside.

## 2020-05-02 LAB — CBC WITH DIFFERENTIAL/PLATELET
Abs Immature Granulocytes: 0.1 10*3/uL — ABNORMAL HIGH (ref 0.00–0.07)
Basophils Absolute: 0 10*3/uL (ref 0.0–0.1)
Basophils Relative: 0 %
Eosinophils Absolute: 0 10*3/uL (ref 0.0–0.5)
Eosinophils Relative: 0 %
HCT: 42.9 % (ref 39.0–52.0)
Hemoglobin: 14.9 g/dL (ref 13.0–17.0)
Immature Granulocytes: 1 %
Lymphocytes Relative: 12 %
Lymphs Abs: 1.7 10*3/uL (ref 0.7–4.0)
MCH: 30 pg (ref 26.0–34.0)
MCHC: 34.7 g/dL (ref 30.0–36.0)
MCV: 86.5 fL (ref 80.0–100.0)
Monocytes Absolute: 0.9 10*3/uL (ref 0.1–1.0)
Monocytes Relative: 6 %
Neutro Abs: 12 10*3/uL — ABNORMAL HIGH (ref 1.7–7.7)
Neutrophils Relative %: 81 %
Platelets: 182 10*3/uL (ref 150–400)
RBC: 4.96 MIL/uL (ref 4.22–5.81)
RDW: 11.7 % (ref 11.5–15.5)
WBC: 14.8 10*3/uL — ABNORMAL HIGH (ref 4.0–10.5)
nRBC: 0 % (ref 0.0–0.2)

## 2020-05-02 LAB — COMPREHENSIVE METABOLIC PANEL
ALT: 67 U/L — ABNORMAL HIGH (ref 0–44)
AST: 18 U/L (ref 15–41)
Albumin: 2.6 g/dL — ABNORMAL LOW (ref 3.5–5.0)
Alkaline Phosphatase: 47 U/L (ref 38–126)
Anion gap: 8 (ref 5–15)
BUN: 29 mg/dL — ABNORMAL HIGH (ref 6–20)
CO2: 22 mmol/L (ref 22–32)
Calcium: 8.6 mg/dL — ABNORMAL LOW (ref 8.9–10.3)
Chloride: 99 mmol/L (ref 98–111)
Creatinine, Ser: 0.8 mg/dL (ref 0.61–1.24)
GFR calc Af Amer: >60 mL/min (ref >60)
GFR calc non Af Amer: >60 mL/min (ref >60)
Glucose, Bld: 183 mg/dL — ABNORMAL HIGH (ref 70–99)
Potassium: 3.9 mmol/L (ref 3.5–5.1)
Sodium: 129 mmol/L — ABNORMAL LOW (ref 135–145)
Total Bilirubin: 1 mg/dL (ref 0.3–1.2)
Total Protein: 5.9 g/dL — ABNORMAL LOW (ref 6.5–8.1)

## 2020-05-02 LAB — C-REACTIVE PROTEIN: CRP: 2.9 mg/dL — ABNORMAL HIGH (ref <1.0)

## 2020-05-02 LAB — GLUCOSE, CAPILLARY: Glucose-Capillary: 251 mg/dL — ABNORMAL HIGH (ref 70–99)

## 2020-05-02 LAB — D-DIMER, QUANTITATIVE: D-Dimer, Quant: 0.32 ug/mL-FEU (ref 0.00–0.50)

## 2020-05-02 MED ORDER — BENZONATATE 100 MG PO CAPS
200.0000 mg | ORAL_CAPSULE | Freq: Three times a day (TID) | ORAL | Status: DC
  Administered 2020-05-02 – 2020-05-03 (×3): 200 mg via ORAL
  Filled 2020-05-02 (×2): qty 2

## 2020-05-02 MED ORDER — NICOTINE 14 MG/24HR TD PT24: 14.0000 mg | MEDICATED_PATCH | TRANSDERMAL | Status: DC

## 2020-05-02 NOTE — Progress Notes (Signed)
PROGRESS NOTE    Gregory Jacobs  QVZ:[id number] DOB: 11-16-1970 DOA: 04/30/2020 PCP: Unk Pinto, MD   Brief Narrative:  Gregory Jacobs is a 49 y.o. male past medical history significant for hypertension and diabetes who has been recently discharged from the hospital on 04/28/2020 during this time he was treated for SARS-CoV-2 pneumonia with IV steroids and remdesivir by the third day he was weaned to room air and was discharged home to continue prednisone and outpatient treatment at the infusion clinic with IV remdesivir, in the clinic he was found to be hypoxic in the 80s, when he was discharged home he was satting greater than 92% with ambulation on room air. He relates he has been coughing and every time especially in the morning he is having large copious amount of sputum production he denies any fever but has been feeling winded when he coughs. In the ED: He is found to be hypoxic satting 86 he was placed on 3 L of oxygen and saturations improved, afebrile with a white count of 13 likely due to steroids, 4.6 (when discharged from the hospital 2.2), blood glucose is 263 ferritin of 1014 (when discharged from the hospital 943), procalcitonin is less than 0.1 chest x-ray showed bilateral infiltrate worse on the right.   Assessment & Plan:   Active Problems:   Essential hypertension   Hyperlipidemia associated with type 2 diabetes mellitus ([place])   Stage 2 chronic kidney disease due to type 2 diabetes mellitus ([place])   Pneumonia due to COVID-19 virus   Acute respiratory failure with hypoxia (HCC)   Acute, recurrent respiratory failure due to COVID-19 viral pneumonia, POA: - Chest x-ray shows minimally progressive disease on the right lower lobe and left lower lobe opacifications without overt multifocal bilateral opacifications usually consistent with Covid.   - We will restart IV remdesivir and steroid course given concern for failure of previous course. - Procalcitonin negative, minimal  leukocytosis in the setting of steroids, afebrile - Unlikely bacterial infection at this point although post viral pneumonia would correlate to this timeframe - Currently holding Actemra/barcitinib given minimal symptoms, and prolonged infectious course at this time likely 10 days to 2 weeks out from initial Covid symptoms - Out of bed to chair, try to keep the patient prone for Lasix now sedated not prone out of bed to chair, continue symptom spirometry and flutter valve. SpO2: 93 % O2 Flow Rate (L/min): 2 L/min Recent Labs    04/30/20 1320 05/01/20 0424 05/01/20 0857 05/02/20 0500  DDIMER 0.49  --  0.75* 0.32  FERRITIN 1,014*  --   --   --   LDH 189  --   --   --   CRP 4.6* 7.3*  --  2.9*   Lab Results  Component Value Date   SARSCOV2NAA POSITIVE (A) 04/26/2020   [place] Not Detected 08/19/2019   Hypovolemic hyponatremia: Start him on IV fluids - follow repeat labs  Elevated bilirubin/elevated LFTs: He denies any abdominal pain nausea or vomiting there trending up compared to previous admission question due to COVID-19, check an abdominal ultrasound and acute hepatitis panel. Hepatic Function Latest Ref Rng & Units 05/02/2020 05/01/2020 04/30/2020  Total Protein 6.5 - 8.1 g/dL 5.9(L) 6.0(L) 6.3(L)  Albumin 3.5 - 5.0 g/dL 2.6(L) 2.6(L) 2.9(L)  AST 15 - 41 U/L 18 23 54(H)  ALT 0 - 44 U/L 67(H) 87(H) 111(H)  Alk Phosphatase 38 - 126 U/L 47 47 55  Total Bilirubin 0.3 - 1.2 mg/dL 1.0 0.9 1.3(H)  Bilirubin, Direct 0.0 - 0.2 mg/dL - - -    Essential hypertension: Blood pressure is borderline so hold lisinopril continue to follow closely   Hyperlipidemia associated with type 2 diabetes mellitus ([place]) We will continue statins, he will be on steroids which will make his blood glucose erratic, long-acting insulin plus sliding scale insulin. CBGs before meals and at bedtime his last A1c was 6.4.   Stage 2 chronic kidney disease due to type 2 diabetes mellitus (HCC) At baseline  noted    DVT Prophylaxis: lovenxo Code Status: full  Family Communication: Lengthy discussion with wife over the phone, updated on patient's prognosis, improvement, likely discharge and improving symptoms. We did discuss contraindications for Actemra/baricitinib as well as no acute indication for CTA given downtrending D-dimer and generally improving status.  Status is: Inpatient  Dispo: The patient is from: Home              Anticipated d/c is to: Home              Anticipated d/c date is: 24-48 hours              Patient currently not medically stable for discharge given ongoing need for further evaluation, IV fluids IV steroids IV Remdesivir in the setting of acute recurrent hypoxia and COVID-19 pneumonia.  Consultants:  None  Procedures:  None planned  Antimicrobials:  Remdesivir  Subjective: No acute issues or events overnight, patient feels markedly improved on supplemental oxygen denies nausea, vomiting, diarrhea, constipation, headache, fevers, chills. Patient continues to be somewhat concerned that he is not "better" and we discussed that this would likely be a prolonged improvement in symptoms over the next few weeks if not longer given his prolonged infectious state and clinical course.  Objective: Vitals:   05/02/20 0615 05/02/20 0910 05/02/20 1127 05/02/20 1356  BP: (!) 103/58 106/66 97/82 101/77  Pulse: 64 65 73 60  Resp: 19 (!) 25 19 (!) 24  Temp:  98.7 F (37.1 C)  98.7 F (37.1 C)  TempSrc:  Oral  Oral  SpO2: 90% 91% (!) 88% 93%  Weight:      Height:        Intake/Output Summary (Last 24 hours) at 05/02/2020 1457 Last data filed at 05/01/2020 2252 Gross per 24 hour  Intake --  Output 400 ml  Net -400 ml   Filed Weights   04/30/20 1308  Weight: 98.3 kg    Examination:  General:  Pleasantly resting in bed, No acute distress. HEENT:  Normocephalic atraumatic.  Sclerae nonicteric, noninjected.  Extraocular movements intact bilaterally. Neck:   Without mass or deformity.  Trachea is midline. Lungs: Scant right lower lobe rhonchi otherwise without overt wheeze, or rales. Heart:  Regular rate and rhythm.  Without murmurs, rubs, or gallops. Abdomen:  Soft, nontender, nondistended.  Without guarding or rebound. Extremities: Without cyanosis, clubbing, edema, or obvious deformity. Vascular:  Dorsalis pedis and posterior tibial pulses palpable bilaterally. Skin:  Warm and dry, no erythema, no ulcerations.  Data Reviewed: I have personally reviewed following labs and imaging studies  CBC: Recent Labs  Lab 04/27/20 0157 04/28/20 0710 04/30/20 1320 05/01/20 0424 05/02/20 0500  WBC 8.7 7.4 13.6* 9.0 14.8*  NEUTROABS 7.2 6.4 11.7* 7.7 12.0*  HGB 15.8 14.9 15.0 14.8 14.9  HCT 46.6 43.9 43.6 43.4 42.9  MCV 87.4 86.6 87.4 87.5 86.5  PLT 137* 163 159 149* 680   Basic Metabolic Panel: Recent Labs  Lab 04/27/20 0157 04/28/20 0710 04/30/20  1320 05/01/20 0424 05/02/20 0500  NA 137 138 131* 134* 129*  K 3.7 4.7 4.0 4.4 3.9  CL 102 100 100 103 99  CO2 23 23 23 22 22   GLUCOSE 287* 275* 263* 289* 183*  BUN 21* 21* 22* 24* 29*  CREATININE 0.96 0.87 0.89 0.74 0.80  CALCIUM 9.4 9.3 8.2* 8.9 8.6*   GFR: Estimated Creatinine Clearance: 130.3 mL/min (by C-G formula based on SCr of 0.8 mg/dL). Liver Function Tests: Recent Labs  Lab 04/27/20 0157 04/28/20 0710 04/30/20 1320 05/01/20 0424 05/02/20 0500  AST 28 35 54* 23 18  ALT 69* 72* 111* 87* 67*  ALKPHOS 55 45 55 47 47  BILITOT 0.5 0.9 1.3* 0.9 1.0  PROT 6.1* 6.1* 6.3* 6.0* 5.9*  ALBUMIN 2.7* 2.5* 2.9* 2.6* 2.6*   No results for input(s): LIPASE, AMYLASE in the last 168 hours. No results for input(s): AMMONIA in the last 168 hours. Coagulation Profile: No results for input(s): INR, PROTIME in the last 168 hours. Cardiac Enzymes: No results for input(s): CKTOTAL, CKMB, CKMBINDEX, TROPONINI in the last 168 hours. BNP (last 3 results) No results for input(s): PROBNP in the  last 8760 hours. HbA1C: No results for input(s): HGBA1C in the last 72 hours. CBG: Recent Labs  Lab 04/28/20 1117 04/30/20 1644 04/30/20 2146 05/01/20 0820 05/01/20 1138  GLUCAP 276* 306* 294* 263* 219*   Lipid Profile: Recent Labs    04/30/20 1320  TRIG 81   Thyroid Function Tests: No results for input(s): TSH, T4TOTAL, FREET4, T3FREE, THYROIDAB in the last 72 hours. Anemia Panel: Recent Labs    04/30/20 1320  FERRITIN 1,014*   Sepsis Labs: Recent Labs  Lab 04/26/20 0834 04/26/20 1040 04/30/20 1320 04/30/20 1544  PROCALCITON 0.13  --  <0.10  --   LATICACIDVEN 2.5* 1.6 2.2* 2.2*    Recent Results (from the past 240 hour(s))  Blood Culture (routine x 2)     Status: None   Collection Time: 04/26/20  8:30 AM   Specimen: BLOOD  Result Value Ref Range Status   Specimen Description   Final    BLOOD RIGHT ANTECUBITAL Performed at [facility], [place] [address] [place], [place] [address]    Special Requests   Final    BOTTLES DRAWN AEROBIC AND ANAEROBIC Blood Culture adequate volume Performed at [facility], [address] [place], [place] [address]    Culture   Final    NO GROWTH 5 DAYS Performed at [facility], [place] [address], [place], [place] [address]    Report Status 05/01/2020 FINAL  Final  SARS Coronavirus 2 by RT PCR (hospital order, performed in [facility] hospital lab) Nasopharyngeal Nasopharyngeal Swab     Status: Abnormal   Collection Time: 04/26/20  8:58 AM   Specimen: Nasopharyngeal Swab  Result Value Ref Range Status   SARS Coronavirus 2 POSITIVE (A) NEGATIVE Final    Comment: RESULT CALLED TO, READ BACK BY AND VERIFIED WITH: CALLED TO S.GOUGE AT 1008 ON [id number] BY SROY (NOTE) SARS-CoV-2 target nucleic acids are DETECTED  SARS-CoV-2 RNA is generally detectable in upper respiratory specimens  during the acute phase of infection.  Positive results are indicative  of the presence of the identified virus, but do not rule  out bacterial infection or co-infection with other pathogens not detected by the test.  Clinical correlation with patient history and  other diagnostic information is necessary to determine patient infection status.  The expected result  is negative.  Fact Sheet for Patients:   StrictlyIdeas.no   Fact Sheet for Healthcare Providers:   BankingDealers.co.za    This test is not yet approved or cleared by the [place] FDA and  has been authorized for detection and/or diagnosis of SARS-CoV-2 by FDA under an Emergency Use Authorization (EUA).  This EUA will remain in effect (meanin g this test can be used) for the duration of  the COVID-19 declaration under Section 564(b)(1) of the Act, 21 U.S.C. section 360-bbb-3(b)(1), unless the authorization is terminated or revoked sooner.  Performed at [facility], [address] [place], [place] [address]   Blood Culture (routine x 2)     Status: None   Collection Time: 04/26/20  8:59 AM   Specimen: BLOOD  Result Value Ref Range Status   Specimen Description   Final    BLOOD LEFT ANTECUBITAL Performed at [facility], [place] [address], [place], [place] [address]    Special Requests   Final    BOTTLES DRAWN AEROBIC ONLY Blood Culture results may not be optimal due to an inadequate volume of blood received in culture bottles Performed at [facility], [address] [place], [place] [address]    Culture   Final    NO GROWTH 5 DAYS Performed at [facility], [place] [address], [place], [place] [address]    Report Status 05/01/2020 FINAL  Final  Blood Culture (routine x 2)     Status: None (Preliminary result)   Collection Time: 04/30/20  1:20 PM   Specimen: BLOOD RIGHT FOREARM  Result Value Ref Range Status   Specimen Description   Final    BLOOD RIGHT FOREARM Performed at [place] [address] [place], [place] [address]     Special Requests   Final    BOTTLES DRAWN AEROBIC AND ANAEROBIC Blood Culture adequate volume Performed at [place] [address] [place], [place] [address]    Culture   Final    NO GROWTH 2 DAYS Performed at [place] [facility] [address] [place], [place] [address]    Report Status PENDING  Incomplete  Blood Culture (routine x 2)     Status: None (Preliminary result)   Collection Time: 04/30/20  1:34 PM   Specimen: BLOOD  Result Value Ref Range Status   Specimen Description   Final    BLOOD BLOOD LEFT FOREARM Performed at [place] [address], [place], [place] [address]    Special Requests   Final    BOTTLES DRAWN AEROBIC AND ANAEROBIC Blood Culture results may not be optimal due to an excessive volume of blood received in culture bottles Performed at [place] [address] [place], [place] [address]    Culture   Final    NO GROWTH 2 DAYS Performed at [facility] [address], [place], [place] [address]    Report Status PENDING  Incomplete    Radiology Studies: US Abdomen Limited  Result Date: 04/30/2020 CLINICAL DATA:  Increased LFTs EXAM: ULTRASOUND ABDOMEN LIMITED RIGHT UPPER QUADRANT COMPARISON:  None. FINDINGS: Gallbladder: Cholecystectomy. Common bile duct: Diameter: 4 mm, Liver: Enlarged, measuring 16 cm. No focal lesion identified. Increased parenchymal echogenicity. Portal vein is patent on color Doppler imaging with normal direction of blood flow towards the liver. Other: None. IMPRESSION: Hepatomegaly. Increased liver echogenicity likely reflects steatosis. Electronically Signed   By:  Macy Mis M.D.   On: 04/30/2020 17:06    Scheduled Meds: . aspirin EC  81 mg Oral q AM  . benzonatate  200 mg Oral TID  . enoxaparin (LOVENOX) injection  40 mg Subcutaneous Q24H  . insulin aspart  0-20 Units Subcutaneous TID WC  . insulin aspart  0-5 Units Subcutaneous QHS  . insulin  aspart  6 Units Subcutaneous TID WC  . insulin detemir  10 Units Subcutaneous BID  . predniSONE  60 mg Oral Q breakfast   Continuous Infusions: . remdesivir 100 mg in NS 100 mL 100 mg (05/02/20 1126)     LOS: 2 days   Time spent: 89mn  Ranae Casebier C Buford Gayler, DO [facility]  If 7PM-7AM, please contact night-coverage [email or web address]  05/02/2020, 2:57 PM

## 2020-05-02 NOTE — Evaluation (Signed)
Physical Therapy Evaluation Patient Details Name: Gregory Jacobs MRN: [id number] DOB: [birth year] Today's Date: 05/02/2020   History of Present Illness  Gregory Jacobs is a 49 y.o. male past medical history significant for hypertension and diabetes who has been recently discharged from the hospital on 04/28/2020 during this time he was treated for SARS-CoV-2 pneumonia with IV steroids and remdesivir by the third day he was weaned to room air and was discharged home to continue prednisone and outpatient treatment at the infusion clinic with remdesivir, in the clinic he was found to be hypoxic in the 80s, sent ton  ED.  Clinical Impression  The patient resting on 3 L(left finger sensor) at 87-91%-variable pleth readings. . Patient ambulated in room, 40' x4, on 2-4 L [place] with SPo2 ranging from 85-87%. See Pulmonary saturation walk test note.   Patient expresses concern that he does not feel as if he is improving. Encouraged patient that increased mobility may improve his breathing/sats. Will  Return with HEP  . Patient  Requires no physical assistance, just requires supplemental oxygen based on monitoring during  Ambulation. Pt admitted with above diagnosis.  Pt currently with functional limitations due to the deficits listed below (see PT Problem List). Pt will benefit from skilled PT to increase their independence and safety with mobility to allow discharge to the venue listed below.       Follow Up Recommendations No PT follow up    Equipment Recommendations  None recommended by PT    Recommendations for Other Services       Precautions / Restrictions Precautions Precaution Comments: monitor sats      Mobility  Bed Mobility Overal bed mobility: Independent                Transfers Overall transfer level: Independent                  Ambulation/Gait Ambulation/Gait assistance: Independent;Supervision Gait Distance (Feet): 40 Feet (x 4) Assistive device: None Gait  Pattern/deviations: WFL(Within Functional Limits) Gait velocity: decr Gait velocity interpretation: 1.31 - 2.62 ft/sec, indicative of limited community ambulator (SOB) General Gait Details: PT assisted with O 2 tank  Stairs            Wheelchair Mobility    Modified Rankin (Stroke Patients Only)       Balance Overall balance assessment: Independent                                           Pertinent Vitals/Pain Pain Assessment: Faces Faces Pain Scale: Hurts even more Pain Location: right side subscapular"feels like an air bubble" Pain Descriptors / Indicators: Aching;Cramping;Discomfort Pain Intervention(s): Monitored during session (stretches)    Home Living Family/patient expects to be discharged to:: Private residence Living Arrangements: Children Available Help at Discharge: Family Type of Home: House Home Access: Stairs to enter   Technical brewer of Steps: 2 Home Layout: One level Home Equipment: None      Prior Function Level of Independence: Independent               Hand Dominance        Extremity/Trunk Assessment   Upper Extremity Assessment Upper Extremity Assessment: Overall WFL for tasks assessed    Lower Extremity Assessment Lower Extremity Assessment: Overall WFL for tasks assessed    Cervical / Trunk Assessment Cervical / Trunk Assessment: Normal  Communication  Communication: No difficulties  Cognition Arousal/Alertness: Awake/alert Behavior During Therapy: WFL for tasks assessed/performed Overall Cognitive Status: Within Functional Limits for tasks assessed                                 General Comments: Expresses concern that he he has not improved and requires O2.      General Comments      Exercises General Exercises - Lower Extremity Hip ABduction/ADduction: AROM;10 reps;Standing Hip Flexion/Marching: AROM;10 reps;Standing Toe Raises: AROM;10 reps;Standing Heel Raises:  AROM;10 reps;Standing Mini-Sqauts: 10 reps;Standing;AROM Other Exercises Other Exercises: flutter x 5  post ambulation   Assessment/Plan    PT Assessment Patient needs continued PT services  PT Problem List Decreased activity tolerance;Cardiopulmonary status limiting activity       PT Treatment Interventions Gait training;Therapeutic activities;Functional mobility training;Patient/family education    PT Goals (Current goals can be found in the Care Plan section)  Acute Rehab PT Goals Patient Stated Goal: I want to get better PT Goal Formulation: With patient Time For Goal Achievement: 05/16/20 Potential to Achieve Goals: Good    Frequency Min 3X/week   Barriers to discharge        Co-evaluation               AM-PAC PT "6 Clicks" Mobility  Outcome Measure Help needed turning from your back to your side while in a flat bed without using bedrails?: None Help needed moving from lying on your back to sitting on the side of a flat bed without using bedrails?: None Help needed moving to and from a bed to a chair (including a wheelchair)?: None Help needed standing up from a chair using your arms (e.g., wheelchair or bedside chair)?: None Help needed to walk in hospital room?: A Little Help needed climbing 3-5 steps with a railing? : A Little 6 Click Score: 22    End of Session Equipment Utilized During Treatment: Oxygen Activity Tolerance: Treatment limited secondary to medical complications (Comment) Patient left: in bed;with call bell/phone within reach Nurse Communication: Mobility status PT Visit Diagnosis: Difficulty in walking, not elsewhere classified (R26.2)    Time: 9417-4081 PT Time Calculation (min) (ACUTE ONLY): 36 min   Charges:   PT Evaluation $PT Eval Moderate Complexity: 1 Mod PT Treatments $Gait Training: 8-22 mins        [place] Pager [phone] Office [phone]   Claretha Cooper 05/02/2020, 10:20 AM

## 2020-05-02 NOTE — Progress Notes (Signed)
Physical Therapy Treatment Patient Details Name: Gregory Jacobs MRN: [id number] DOB: 1971-03-24 Today's Date: 05/02/2020    History of Present Illness Gregory Jacobs is a 49 y.o. male past medical history significant for hypertension and diabetes who has been recently discharged from the hospital on 04/28/2020 during this time he was treated for SARS-CoV-2 pneumonia with IV steroids and remdesivir by the third day he was weaned to room air and was discharged home to continue prednisone and outpatient treatment at the infusion clinic with remdesivir, in the clinic he was found to be hypoxic in the 80s, sent ton  ED.    PT Comments    Provided blue theraband and HEP, reviewed all exercises, Patient able to teach back all exercises. Continue PT.   Follow Up Recommendations  No PT follow up     Equipment Recommendations  None recommended by PT    Recommendations for Other Services       Precautions / Restrictions Precautions Precaution Comments: monitor sats    Mobility  Bed Mobility Overal bed mobility: Independent                Transfers   Stairs             Wheelchair Mobility    Modified Rankin (Stroke Patients Only)       Balance Overall balance assessment: Independent                                          Cognition Arousal/Alertness: Awake/alert Behavior During Therapy: WFL for tasks assessed/performed Overall Cognitive Status: Within Functional Limits for tasks assessed                                 General Comments:      Exercises HEP for blue TB for UE's reviewed and LE exercises reviewed. Patient  Able to teach back.     General Comments        Pertinent Vitals/Pain Pain Assessment: Faces Faces Pain Scale: Hurts even more Pain Location: right side subscapular"feels like an air bubble" Pain Descriptors / Indicators: Aching;Cramping;Discomfort Pain Intervention(s): Monitored during session  (stretches)    Home Living Family/patient expects to be discharged to:: Private residence Living Arrangements: Children Available Help at Discharge: Family Type of Home: House Home Access: Stairs to enter   Home Layout: One level Home Equipment: None      Prior Function Level of Independence: Independent          PT Goals (current goals can now be found in the care plan section) Acute Rehab PT Goals Patient Stated Goal: I want to get better PT Goal Formulation: With patient Time For Goal Achievement: 05/16/20 Potential to Achieve Goals: Good Progress towards PT goals: Progressing toward goals    Frequency    Min 3X/week      PT Plan      Co-evaluation              AM-PAC PT "6 Clicks" Mobility   Outcome Measure  Help needed turning from your back to your side while in a flat bed without using bedrails?: None Help needed moving from lying on your back to sitting on the side of a flat bed without using bedrails?: None Help needed moving to and from a bed to a chair (including a wheelchair)?: None  Help needed standing up from a chair using your arms (e.g., wheelchair or bedside chair)?: None Help needed to walk in hospital room?: A Little Help needed climbing 3-5 steps with a railing? : A Little 6 Click Score: 22    End of Session Equipment Utilized During Treatment: Oxygen Activity Tolerance: Patient tolerated treatment well Patient left: in bed;with call bell/phone within reach Nurse Communication: Mobility status PT Visit Diagnosis: Difficulty in walking, not elsewhere classified (R26.2)     Time: 1100-1119 PT Time Calculation (min) (ACUTE ONLY): 19 min  Charges:  $Therapeutic Exercise: 8-22 mins                     Tresa Endo PT Acute Rehabilitation Services Pager [phone] Office [phone]    Claretha Cooper 05/02/2020, 1:36 PM

## 2020-05-02 NOTE — Progress Notes (Signed)
SATURATION QUALIFICATIONS: (This note is used to comply with regulatory documentation for home oxygen)  Patient Saturations on Room Air at Rest = 85%L finger probe used)  Patient Saturations on Room Air while Ambulating = 82%  Patient Saturations on 2 Liters of oxygen while Ambulating = 85%, on 4 L 87%  Please briefly explain why patient needs home oxygen:Patient requires supplemental oxygen to maintain saturation during ambulation.[place] Pager [phone] Office [phone]

## 2020-05-03 ENCOUNTER — Other Ambulatory Visit: Payer: Self-pay

## 2020-05-03 LAB — COMPREHENSIVE METABOLIC PANEL
ALT: 81 U/L — ABNORMAL HIGH (ref 0–44)
AST: 32 U/L (ref 15–41)
Albumin: 2.5 g/dL — ABNORMAL LOW (ref 3.5–5.0)
Alkaline Phosphatase: 50 U/L (ref 38–126)
Anion gap: 10 (ref 5–15)
BUN: 27 mg/dL — ABNORMAL HIGH (ref 6–20)
CO2: 23 mmol/L (ref 22–32)
Calcium: 8.9 mg/dL (ref 8.9–10.3)
Chloride: 104 mmol/L (ref 98–111)
Creatinine, Ser: 0.84 mg/dL (ref 0.61–1.24)
GFR calc Af Amer: >60 mL/min (ref >60)
GFR calc non Af Amer: >60 mL/min (ref >60)
Glucose, Bld: 152 mg/dL — ABNORMAL HIGH (ref 70–99)
Potassium: 4.1 mmol/L (ref 3.5–5.1)
Sodium: 137 mmol/L (ref 135–145)
Total Bilirubin: 1.2 mg/dL (ref 0.3–1.2)
Total Protein: 5.9 g/dL — ABNORMAL LOW (ref 6.5–8.1)

## 2020-05-03 LAB — CBC WITH DIFFERENTIAL/PLATELET
Abs Immature Granulocytes: 0.08 10*3/uL — ABNORMAL HIGH (ref 0.00–0.07)
Basophils Absolute: 0 10*3/uL (ref 0.0–0.1)
Basophils Relative: 0 %
Eosinophils Absolute: 0.1 10*3/uL (ref 0.0–0.5)
Eosinophils Relative: 1 %
HCT: 45.3 % (ref 39.0–52.0)
Hemoglobin: 15.3 g/dL (ref 13.0–17.0)
Immature Granulocytes: 1 %
Lymphocytes Relative: 22 %
Lymphs Abs: 2.3 10*3/uL (ref 0.7–4.0)
MCH: 29.7 pg (ref 26.0–34.0)
MCHC: 33.8 g/dL (ref 30.0–36.0)
MCV: 87.8 fL (ref 80.0–100.0)
Monocytes Absolute: 0.9 10*3/uL (ref 0.1–1.0)
Monocytes Relative: 8 %
Neutro Abs: 7.4 10*3/uL (ref 1.7–7.7)
Neutrophils Relative %: 68 %
Platelets: 213 10*3/uL (ref 150–400)
RBC: 5.16 MIL/uL (ref 4.22–5.81)
RDW: 11.8 % (ref 11.5–15.5)
WBC: 10.8 10*3/uL — ABNORMAL HIGH (ref 4.0–10.5)
nRBC: 0 % (ref 0.0–0.2)

## 2020-05-03 LAB — GLUCOSE, CAPILLARY: Glucose-Capillary: 88 mg/dL (ref 70–99)

## 2020-05-03 LAB — D-DIMER, QUANTITATIVE: D-Dimer, Quant: 1.11 ug/mL-FEU — ABNORMAL HIGH (ref 0.00–0.50)

## 2020-05-03 LAB — C-REACTIVE PROTEIN: CRP: 1.4 mg/dL — ABNORMAL HIGH (ref <1.0)

## 2020-05-03 MED ORDER — PREDNISONE 10 MG PO TABS: ORAL_TABLET | ORAL | 0 refills | Status: AC

## 2020-05-03 NOTE — Discharge Summary (Signed)
Physician Discharge Summary  Gregory Jacobs TOI:[id number] DOB: 06/25/1971 DOA: 04/30/2020  PCP: Unk Pinto, MD  Admit date: 04/30/2020 Discharge date: 05/03/2020  Admitted From: Home Disposition: Home  Recommendations for Outpatient Follow-up:  1. Follow up with PCP in 1-2 weeks 2. Please obtain BMP/CBC in one week  Home Health: None Equipment/Devices: Oxygen  Discharge Condition: Stable CODE STATUS: Full Diet recommendation: Diabetic diet as tolerated  Brief/Interim Summary: Gregory Jacobs a 49 y.o.malepast medical history significant for hypertension and diabetes who has been recently discharged from the hospital on 04/28/2020 during this time he was treated for SARS-CoV-2 pneumonia with IV steroids and remdesivir by the third day he was weaned to room air and was discharged home to continue prednisone and outpatient treatment at the infusion clinic with IV remdesivir, in the clinic he was found to be hypoxic in the 80s, when he was discharged home he was satting greater than 92% with ambulation on room air. He relates he has been coughing and every time especially in the morning he is having large copious amount of sputum production he denies any fever but has been feeling winded when he coughs. In the ED: He is found to be hypoxic satting 86 he was placed on 3 L of oxygen and saturations improved, afebrile with a white count of 13 likely due to steroids, 4.6 (when discharged from the hospital 2.2), blood glucose is 263 ferritin of 1014 (when discharged from the hospital 943), procalcitonin is less than 0.1 chest x-ray showed bilateral infiltrate worse on the right.  Patient met as above with acute hypoxic respiratory failure in the setting of COVID-19 pneumonia.  Patient was previously discharged from hospital stay on room air given his unremarkable oxygen walk screen able to maintain saturations in the 90s without supplementation of oxygen.  Unfortunately at home his symptoms began  to worsen and became somewhat hypoxic.  Patient readmitted as above with resumption of Remdesivir and steroids.  Over the past 48 hours patient has drastically improved, requiring only minimal oxygen with ambulation now, appropriately downtrending inflammatory markers and otherwise stable and agreeable for discharge home, at this time patient does require 3 L nasal cannula with exertion and rest to maintain saturations but otherwise generally improving.  We recommend close follow-up with PCP in the next few days to continue to evaluate need for oxygen and to continue to weaning oxygen as he is able to.  Patient will otherwise be discharged with remainder of steroid taper.  Patient stable and agreeable for discharge home with close follow-up as outlined above.  We discussed the need for ongoing quarantine given his ongoing symptoms for 21 days from initial swab per CDC guidelines.  Discharge Diagnoses:  Active Problems:   Essential hypertension   Hyperlipidemia associated with type 2 diabetes mellitus ([place])   Stage 2 chronic kidney disease due to type 2 diabetes mellitus ([place])   Pneumonia due to COVID-19 virus   Acute respiratory failure with hypoxia ([place])  Patient Saturations on Room Air at Rest = 88% Patient Saturations on [employer] while Ambulating = 84% Patient Saturations on 3 Liters of oxygen while Ambulating = 92%  Discharge Instructions  Discharge Instructions    Call MD for:  difficulty breathing, headache or visual disturbances   Complete by: As directed    Call MD for:  extreme fatigue   Complete by: As directed    Diet Carb Modified   Complete by: As directed    Increase activity slowly   Complete by:  As directed      Allergies as of 05/03/2020   No Known Allergies     Medication List    TAKE these medications   albuterol 108 (90 Base) MCG/ACT inhaler Commonly known as: VENTOLIN HFA Inhale 2 puffs into the lungs every 4 (four) hours as needed for wheezing or shortness of  breath. Notes to patient: 05/03/2020   APPLE CIDER VINEGAR PO Take 2 capsules by mouth daily.   ascorbic acid 500 MG tablet Commonly known as: VITAMIN C Take 500-1,000 mg by mouth daily.   aspirin EC 81 MG tablet Take 81 mg by mouth in the morning. Swallow whole.   benzonatate 200 MG capsule Commonly known as: TESSALON Take 1 capsule (200 mg total) by mouth 3 (three) times daily.   chlorpheniramine-HYDROcodone 10-8 MG/5ML Suer Commonly known as: TUSSIONEX Take 5 mLs by mouth every 12 (twelve) hours as needed for cough.   Cinnamon 500 MG capsule Take 500 mg by mouth 2 (two) times daily.   D3-50 1.25 MG (50000 UT) capsule Generic drug: Cholecalciferol Take 1 tablet 3 x /week on Mon Wed & Fri for Severe Vitamin D Deficiency What changed:   how much to take  how to take this  when to take this  additional instructions   fexofenadine 180 MG tablet Commonly known as: ALLEGRA TAKE 1 TABLET BY MOUTH EVERY DAY What changed:   when to take this  reasons to take this   lisinopril 5 MG tablet Commonly known as: ZESTRIL TAKE 1 TABLET DAILY FOR BLOOD PRESSURE What changed:   how much to take  how to take this  when to take this  additional instructions   metFORMIN 500 MG 24 hr tablet Commonly known as: GLUCOPHAGE-XR Take 2 tablets 2 x /day with Meals for Diabetes What changed:   how much to take  how to take this  when to take this  additional instructions   nitroGLYCERIN 0.4 MG SL tablet Commonly known as: Nitrostat Place 1 tablet (0.4 mg total) under the tongue every 5 (five) minutes as needed for chest pain.   NovoLIN 70/30 (70-30) 100 UNIT/ML injection Generic drug: insulin NPH-regular Human 10-20 units 2 to 3 x /day as Directed What changed:   how much to take  how to take this  when to take this  reasons to take this  additional instructions   predniSONE 10 MG tablet Commonly known as: DELTASONE Take 4 tablets (40 mg total) by mouth  daily for 3 days, THEN 3 tablets (30 mg total) daily for 3 days, THEN 2 tablets (20 mg total) daily for 3 days, THEN 1 tablet (10 mg total) daily for 3 days. Start taking on: May 03, 2020 What changed: See the new instructions.   rosuvastatin 5 MG tablet Commonly known as: CRESTOR Take 5 mg by mouth daily.   traMADol 50 MG tablet Commonly known as: Ultram Take 1 tablet (50 mg total) by mouth every 6 (six) hours as needed. What changed: reasons to take this   Vitamin B-12 5000 MCG Subl Place 5,000 mcg under the tongue daily.   VITAMIN E PO Take 1 capsule by mouth daily.   zinc gluconate 50 MG tablet Take 200 mg by mouth daily.            Durable Medical Equipment  (From admission, onward)         Start     Ordered   05/03/20 1606  DME Oxygen  Once  Question Answer Comment  Length of Need 6 Months   Mode or (Route) Nasal cannula   Liters per Minute 3   Frequency Continuous (stationary and portable oxygen unit needed)   Oxygen conserving device No   Oxygen delivery system Gas      05/03/20 1606          Follow-up Information    [facility] Follow up.   Why: agency will provide home oxygen Contact information: [address] [place] [address] [phone]              No Known Allergies  Consultations:  None   Procedures/Studies: DG Chest 2 View  Result Date: 04/24/2020 CLINICAL DATA:  COVID EXAM: CHEST - 2 VIEW COMPARISON:  December 21, 2018 FINDINGS: The cardiomediastinal silhouette is normal in contour. No pleural effusion. No pneumothorax. There are bilateral peripheral predominant heterogeneous opacities. Status post cholecystectomy. No acute osseous abnormality noted. IMPRESSION: Bilateral peripheral predominant heterogeneous opacities consistent with COVID-19 pneumonia. Electronically Signed   By: Valentino Saxon MD   On: 04/24/2020 14:40   US Abdomen Limited  Result Date: 04/30/2020 CLINICAL DATA:   Increased LFTs EXAM: ULTRASOUND ABDOMEN LIMITED RIGHT UPPER QUADRANT COMPARISON:  None. FINDINGS: Gallbladder: Cholecystectomy. Common bile duct: Diameter: 4 mm, Liver: Enlarged, measuring 16 cm. No focal lesion identified. Increased parenchymal echogenicity. Portal vein is patent on color Doppler imaging with normal direction of blood flow towards the liver. Other: None. IMPRESSION: Hepatomegaly. Increased liver echogenicity likely reflects steatosis. Electronically Signed   By: Macy Mis M.D.   On: 04/30/2020 17:06   DG Chest Port 1 View  Result Date: 04/30/2020 CLINICAL DATA:  Hypoxia.  COVID positive. EXAM: PORTABLE CHEST 1 VIEW COMPARISON:  Chest x-rays dated 04/26/2020 and 04/24/2020 FINDINGS: Slightly denser opacity within the LEFT perihilar lung. Persistent patchy opacities along the periphery of the LEFT lung, consistent with pneumonia. Additional pneumonia versus scarring at the RIGHT lung base. No pleural effusion or pneumothorax is seen. Heart size and mediastinal contours are within normal limits. Osseous structures about the chest are unremarkable. IMPRESSION: 1. Multifocal pneumonia, slightly denser within the LEFT perihilar lung suggesting worsening. 2. Additional pneumonia versus scarring at the RIGHT lung base. Electronically Signed   By: Franki Cabot M.D.   On: 04/30/2020 14:33   DG Chest Port 1 View  Result Date: 04/26/2020 CLINICAL DATA:  Positive test for Covid on 04/16/2020. recently diagnosed with pneumonia. Shortness of breath. EXAM: PORTABLE CHEST 1 VIEW COMPARISON:  04/24/2020 FINDINGS: Better lung expansion compared with most recent exam. There are persistent patchy airspace filling opacities along the periphery of the LEFT lung. There is persistent peripheral patchy opacity in the RIGHT mid to LOWER lung zone. No pulmonary edema or effusions. IMPRESSION: Stable multifocal infiltrates. Electronically Signed   By: Nolon Nations M.D.   On: 04/26/2020 09:37    Subjective:  No acute issues or events overnight, ambulating today without overt dyspnea, denies nausea, vomiting, diarrhea, constipation, headache, fevers, chills.  Discharge Exam: Vitals:   05/03/20 1044 05/03/20 1336  BP:  120/84  Pulse: 87 72  Resp:  20  Temp:  (!) 97.5 F (36.4 C)  SpO2: 91% 94%   Vitals:   05/03/20 0430 05/03/20 0833 05/03/20 1044 05/03/20 1336  BP: 99/66 98/68  120/84  Pulse: (!) 54 (!) 59 87 72  Resp: _0 Temp: 97.8 F (36.6 C) 97.6 F (36.4 C)  (!) 97.5 F (36.4 C)  TempSrc:  Oral  Oral  SpO2: 95% 91% 91% 94%  Weight:      Height:        General: Pt is alert, awake, not in acute distress Cardiovascular: RRR, S1/S2 +, no rubs, no gallops Respiratory: Scant bibasilar rhonchi, without overt wheezing or rhonchi Abdominal: Soft, NT, ND, bowel sounds + Extremities: no edema, no cyanosis   The results of significant diagnostics from this hospitalization (including imaging, microbiology, ancillary and laboratory) are listed below for reference.     Microbiology: Recent Results (from the past 240 hour(s))  Blood Culture (routine x 2)     Status: None   Collection Time: 04/26/20  8:30 AM   Specimen: BLOOD  Result Value Ref Range Status   Specimen Description   Final    BLOOD RIGHT ANTECUBITAL Performed at [facility], [place] [address] [place], [place] [address]    Special Requests   Final    BOTTLES DRAWN AEROBIC AND ANAEROBIC Blood Culture adequate volume Performed at [facility], [place] [address] [place], [place] [address]    Culture   Final    NO GROWTH 5 DAYS Performed at [facility], [place] [address], [place], [place] [address]    Report Status 05/01/2020 FINAL  Final  SARS Coronavirus 2 by RT PCR (hospital order, performed in [facility] hospital lab) Nasopharyngeal Nasopharyngeal Swab     Status: Abnormal   Collection Time: 04/26/20  8:58 AM   Specimen: Nasopharyngeal Swab  Result Value Ref Range Status   SARS  Coronavirus 2 POSITIVE (A) NEGATIVE Final    Comment: RESULT CALLED TO, READ BACK BY AND VERIFIED WITH: CALLED TO S.GOUGE AT 1008 ON [id number] BY SROY (NOTE) SARS-CoV-2 target nucleic acids are DETECTED  SARS-CoV-2 RNA is generally detectable in upper respiratory specimens  during the acute phase of infection.  Positive results are indicative  of the presence of the identified virus, but do not rule out bacterial infection or co-infection with other pathogens not detected by the test.  Clinical correlation with patient history and  other diagnostic information is necessary to determine patient infection status.  The expected result is negative.  Fact Sheet for Patients:   StrictlyIdeas.no   Fact Sheet for Healthcare Providers:   BankingDealers.co.za    This test is not yet approved or cleared by the [place] FDA and  has been authorized for detection and/or diagnosis of SARS-CoV-2 by FDA under an Emergency Use Authorization (EUA).  This EUA will remain in effect (meanin g this test can be used) for the duration of  the COVID-19 declaration under Section 564(b)(1) of the Act, 21 U.S.C. section 360-bbb-3(b)(1), unless the authorization is terminated or revoked sooner.  Performed at [facility], [place] [address] [place], [place] [address]   Blood Culture (routine x 2)     Status: None   Collection Time: 04/26/20  8:59 AM   Specimen: BLOOD  Result Value Ref Range Status   Specimen Description   Final    BLOOD LEFT ANTECUBITAL Performed at [facility], [place] [address] [place], [place] [address]    Special Requests   Final    BOTTLES DRAWN AEROBIC ONLY Blood Culture results may not be optimal due to an inadequate volume of blood received in culture bottles Performed at [facility], [place] [place] [employer], [place] [address]    Culture   Final    NO GROWTH 5 DAYS  Performed at [facility],  [place] [address] [place], [place] [address]    Report Status 05/01/2020 FINAL  Final  Blood Culture (routine x 2)     Status: None (Preliminary result)   Collection Time: 04/30/20  1:20 PM   Specimen: BLOOD RIGHT FOREARM  Result Value Ref Range Status   Specimen Description   Final    BLOOD RIGHT FOREARM Performed at [place] [address] [place], [place] [address]    Special Requests   Final    BOTTLES DRAWN AEROBIC AND ANAEROBIC Blood Culture adequate volume Performed at [place] [address] [place], [place] [address]    Culture   Final    NO GROWTH 3 DAYS Performed at [facility], [place] [address], [place], [place] [address]    Report Status PENDING  Incomplete  Blood Culture (routine x 2)     Status: None (Preliminary result)   Collection Time: 04/30/20  1:34 PM   Specimen: BLOOD  Result Value Ref Range Status   Specimen Description   Final    BLOOD BLOOD LEFT FOREARM Performed at [facility] [place] [address] [place], [place] [address]    Special Requests   Final    BOTTLES DRAWN AEROBIC AND ANAEROBIC Blood Culture results may not be optimal due to an excessive volume of blood received in culture bottles Performed at [place] [address] [place], [place] [address]    Culture   Final    NO GROWTH 3 DAYS Performed at [facility], [place] [address] [place], [place] [address]    Report Status PENDING  Incomplete     Labs: BNP (last 3 results) No results for input(s): BNP in the last 8760 hours. Basic Metabolic Panel: Recent Labs  Lab 04/28/20 0710 04/30/20 1320 05/01/20 0424 05/02/20 0500 05/03/20 0418  NA 138 131* 134* 129* 137  K 4.7 4.0 4.4 3.9 4.1  CL 100 100 103 99 104  CO2 _0 GLUCOSE 275* 263* 289* 183* 152*  BUN 21* 22* 24* 29* 27*  CREATININE 0.87 0.89 0.74 0.80 0.84  CALCIUM 9.3 8.2* 8.9 8.6* 8.9   Liver Function Tests: Recent  Labs  Lab 04/28/20 0710 04/30/20 1320 05/01/20 0424 05/02/20 0500 05/03/20 0418  AST 35 54* 23 18 32  ALT 72* 111* 87* 67* 81*  ALKPHOS 45 55 47 47 50  BILITOT 0.9 1.3* 0.9 1.0 1.2  PROT 6.1* 6.3* 6.0* 5.9* 5.9*  ALBUMIN 2.5* 2.9* 2.6* 2.6* 2.5*   No results for input(s): LIPASE, AMYLASE in the last 168 hours. No results for input(s): AMMONIA in the last 168 hours. CBC: Recent Labs  Lab 04/28/20 0710 04/30/20 1320 05/01/20 0424 05/02/20 0500 05/03/20 0418  WBC 7.4 13.6* 9.0 14.8* 10.8*  NEUTROABS 6.4 11.7* 7.7 12.0* 7.4  HGB 14.9 15.0 14.8 14.9 15.3  HCT 43.9 43.6 43.4 42.9 45.3  MCV 86.6 87.4 87.5 86.5 87.8  PLT 163 159 149* 182 213   Cardiac Enzymes: No results for input(s): CKTOTAL, CKMB, CKMBINDEX, TROPONINI in the last 168 hours. BNP: Invalid input(s): POCBNP CBG: Recent Labs  Lab 04/30/20 2146 05/01/20 0820 05/01/20 1138 05/02/20 2027 05/03/20 0829  GLUCAP 294* 263* 219* 251* 88   D-Dimer Recent Labs    05/02/20 0500 05/03/20 0418  DDIMER 0.32 1.11*   Hgb A1c No results for input(s): HGBA1C in the last 72 hours. Lipid  Profile No results for input(s): CHOL, HDL, LDLCALC, TRIG, CHOLHDL, LDLDIRECT in the last 72 hours. Thyroid function studies No results for input(s): TSH, T4TOTAL, T3FREE, THYROIDAB in the last 72 hours.  Invalid input(s): FREET3 Anemia work up No results for input(s): VITAMINB12, FOLATE, FERRITIN, TIBC, IRON, RETICCTPCT in the last 72 hours. Urinalysis    Component Value Date/Time   COLORURINE DARK YELLOW 04/16/2020 0851   APPEARANCEUR TURBID (A) 04/16/2020 0851   LABSPEC 1.038 (H) 04/16/2020 0851   PHURINE 5.5 04/16/2020 0851   GLUCOSEU 1+ (A) 04/16/2020 0851   HGBUR NEGATIVE 04/16/2020 0851   BILIRUBINUR NEGATIVE 01/19/2018 0846   KETONESUR 1+ (A) 04/16/2020 0851   PROTEINUR 1+ (A) 04/16/2020 0851   NITRITE POSITIVE (A) 01/19/2018 0846   LEUKOCYTESUR LARGE (A) 01/19/2018 0846   Sepsis Labs Invalid input(s):  PROCALCITONIN,  WBC,  LACTICIDVEN Microbiology Recent Results (from the past 240 hour(s))  Blood Culture (routine x 2)     Status: None   Collection Time: 04/26/20  8:30 AM   Specimen: BLOOD  Result Value Ref Range Status   Specimen Description   Final    BLOOD RIGHT ANTECUBITAL Performed at [facility], [place] [address], [place], [place] [address]    Special Requests   Final    BOTTLES DRAWN AEROBIC AND ANAEROBIC Blood Culture adequate volume Performed at [facility], [address] [place], [place] [address]    Culture   Final    NO GROWTH 5 DAYS Performed at [facility], [place] [address], [place], [place] [address]    Report Status 05/01/2020 FINAL  Final  SARS Coronavirus 2 by RT PCR (hospital order, performed in [facility] hospital lab) Nasopharyngeal Nasopharyngeal Swab     Status: Abnormal   Collection Time: 04/26/20  8:58 AM   Specimen: Nasopharyngeal Swab  Result Value Ref Range Status   SARS Coronavirus 2 POSITIVE (A) NEGATIVE Final    Comment: RESULT CALLED TO, READ BACK BY AND VERIFIED WITH: CALLED TO S.GOUGE AT 1008 ON [id number] BY SROY (NOTE) SARS-CoV-2 target nucleic acids are DETECTED  SARS-CoV-2 RNA is generally detectable in upper respiratory specimens  during the acute phase of infection.  Positive results are indicative  of the presence of the identified virus, but do not rule out bacterial infection or co-infection with other pathogens not detected by the test.  Clinical correlation with patient history and  other diagnostic information is necessary to determine patient infection status.  The expected result is negative.  Fact Sheet for Patients:   StrictlyIdeas.no   Fact Sheet for Healthcare Providers:   BankingDealers.co.za    This test is not yet approved or cleared by the [place] FDA and  has been authorized for detection and/or diagnosis of SARS-CoV-2 by FDA under an  Emergency Use Authorization (EUA).  This EUA will remain in effect (meanin g this test can be used) for the duration of  the COVID-19 declaration under Section 564(b)(1) of the Act, 21 U.S.C. section 360-bbb-3(b)(1), unless the authorization is terminated or revoked sooner.  Performed at [facility], [place] [place], [place] [address]   Blood Culture (routine x 2)     Status: None   Collection Time: 04/26/20  8:59 AM   Specimen: BLOOD  Result Value Ref Range Status   Specimen Description   Final    BLOOD LEFT ANTECUBITAL Performed at [facility], [place] [address] [place],  [address]  Special Requests   Final    BOTTLES DRAWN AEROBIC ONLY Blood Culture results may not be optimal due to an inadequate volume of blood received in culture bottles Performed at [facility], [address] [place], [place] [address]    Culture   Final    NO GROWTH 5 DAYS Performed at [facility], [place] [address] [place], [place] [address]    Report Status 05/01/2020 FINAL  Final  Blood Culture (routine x 2)     Status: None (Preliminary result)   Collection Time: 04/30/20  1:20 PM   Specimen: BLOOD RIGHT FOREARM  Result Value Ref Range Status   Specimen Description   Final    BLOOD RIGHT FOREARM Performed at [place] [address] [place], [place] [address]    Special Requests   Final    BOTTLES DRAWN AEROBIC AND ANAEROBIC Blood Culture adequate volume Performed at [place] [address], [place], [place] [address]    Culture   Final    NO GROWTH 3 DAYS Performed at [facility], [place] [address] [place], [place] [address]    Report Status PENDING  Incomplete  Blood Culture (routine x 2)     Status: None (Preliminary result)   Collection Time: 04/30/20  1:34 PM   Specimen: BLOOD  Result Value Ref Range Status   Specimen Description   Final    BLOOD BLOOD LEFT FOREARM Performed at  [place] [address], [place], [place] [address]    Special Requests   Final    BOTTLES DRAWN AEROBIC AND ANAEROBIC Blood Culture results may not be optimal due to an excessive volume of blood received in culture bottles Performed at [place] [address], [place], [place] [address]    Culture   Final    NO GROWTH 3 DAYS Performed at [facility], [place] [address], [place], [place] [address]    Report Status PENDING  Incomplete     Time coordinating discharge: Over 30 minutes  SIGNED:   Little Ishikawa, DO [facility] 05/03/2020, 4:06 PM Pager   If 7PM-7AM, please contact night-coverage [email or web address]

## 2020-05-03 NOTE — TOC Initial Note (Signed)
Transition of Care [facility]) - Initial/Assessment Note    Patient Details  Name: Gregory Jacobs MRN: [id number] Date of Birth: 08/01/1971  Transition of Care (TOC) CM/SW Contact:    Joaquin Courts, RN Phone Number: 05/03/2020, 3:48 PM  Clinical Narrative:  Home oxygen referral given to [employer] rep [place].                Expected Discharge Plan: Home/Self Care Barriers to Discharge: No Barriers Identified   Patient Goals and CMS Choice Patient states their goals for this hospitalization and ongoing recovery are:: to go home      Expected Discharge Plan and Services Expected Discharge Plan: Home/Self Care   Discharge Planning Services: CM Consult   Living arrangements for the past 2 months: Single Family Home Expected Discharge Date:  (unknown)               DME Arranged: Oxygen DME Agency: [employer] Date DME Agency Contacted: 05/03/20 Time DME Agency Contacted: [id number] Representative spoke with at DME Agency: [place]: NA [place] Agency: NA        Prior Living Arrangements/Services Living arrangements for the past 2 months: [place]   Patient language and need for interpreter reviewed:: Yes Do you feel safe going back to the place where you live?: Yes      Need for Family Participation in Patient Care: Yes (Comment) Care giver support system in place?: Yes (comment)   Criminal Activity/Legal Involvement Pertinent to Current Situation/Hospitalization: No - Comment as needed  Activities of Daily Living Home Assistive Devices/Equipment: Blood pressure cuff, CBG Meter ADL Screening (condition at time of admission) Patient's cognitive ability adequate to safely complete daily activities?: Yes Is the patient deaf or have difficulty hearing?: No Does the patient have difficulty seeing, even when wearing glasses/contacts?: No Does the patient have difficulty concentrating, remembering, or making decisions?: No Patient able to express need for assistance with  ADLs?: Yes Does the patient have difficulty dressing or bathing?: No Independently performs ADLs?: Yes (appropriate for developmental age) Does the patient have difficulty walking or climbing stairs?: Yes Weakness of Legs: Both Weakness of Arms/Hands: None  Permission Sought/Granted                  Emotional Assessment           Psych Involvement: No (comment)  Admission diagnosis:  Elevated LFTs [R79.89] Acute hypoxemic respiratory failure due to COVID-19 ([place]) [U07.1, J96.01] Pneumonia due to COVID-19 virus [U07.1, J12.82] Patient Active Problem List   Diagnosis Date Noted  . Acute respiratory failure with hypoxia ([place])   . Hypoxia 04/26/2020  . Pneumonia due to COVID-19 virus 04/24/2020  . COVID-19 (04/16/2020) 04/17/2020  . Aortic atherosclerosis ([place]) 01/16/2020  . Pityriasis rosea 06/25/2018  . Abnormal EKG 06/25/2018  . Prostate nodule 06/22/2018  . Stage 2 chronic kidney disease due to type 2 diabetes mellitus ([place]) 06/22/2018  . Overweight (BMI 25.0-29.9) 02/08/2018  . Essential hypertension 08/25/2017  . Hyperlipidemia associated with type 2 diabetes mellitus ([place]) 08/25/2017  . Type 2 diabetes mellitus with kidney complication ([place]) 92/07/9416  . Vitamin D deficiency 08/25/2017  . Fatty liver    PCP:  Unk Pinto, MD Pharmacy:   [id number] - [place], [place] - [address] [place] [address] Phone: [phone] Fax: [phone]     Social Determinants of Health (SDOH) Interventions    Readmission Risk Interventions No flowsheet  data found.

## 2020-05-03 NOTE — Progress Notes (Signed)
SATURATION QUALIFICATIONS: (This note is used to comply with regulatory documentation for home oxygen)  Patient Saturations on Room Air at Rest = 88%  Patient Saturations on Room Air while Ambulating = 84%  Patient Saturations on 3 Liters of oxygen while Ambulating = 92%  Please briefly explain why patient needs home oxygen: Patient unable to maintain adequate oxygenation on room air.

## 2020-05-04 ENCOUNTER — Encounter: Payer: Self-pay | Admitting: *Deleted

## 2020-05-04 ENCOUNTER — Other Ambulatory Visit: Payer: Self-pay | Admitting: *Deleted

## 2020-05-04 ENCOUNTER — Ambulatory Visit: Payer: Managed Care, Other (non HMO) | Admitting: Adult Health

## 2020-05-04 DIAGNOSIS — E119 Type 2 diabetes mellitus without complications: Secondary | ICD-10-CM | POA: Insufficient documentation

## 2020-05-04 DIAGNOSIS — I10 Essential (primary) hypertension: Secondary | ICD-10-CM

## 2020-05-04 LAB — GLUCOSE, CAPILLARY
Glucose-Capillary: 236 mg/dL — ABNORMAL HIGH (ref 70–99)
Glucose-Capillary: 213 mg/dL — ABNORMAL HIGH (ref 70–99)
Glucose-Capillary: 159 mg/dL — ABNORMAL HIGH (ref 70–99)
Glucose-Capillary: 213 mg/dL — ABNORMAL HIGH (ref 70–99)
Glucose-Capillary: 243 mg/dL — ABNORMAL HIGH (ref 70–99)
Glucose-Capillary: 239 mg/dL — ABNORMAL HIGH (ref 70–99)

## 2020-05-04 MED ORDER — CONTOUR NEXT TEST VI STRP: ORAL_STRIP | 1 refills | Status: DC

## 2020-05-04 MED ORDER — LISINOPRIL 5 MG PO TABS: ORAL_TABLET | ORAL | 1 refills | Status: DC

## 2020-05-04 MED ORDER — LANCETS MICRO THIN 33G MISC: 1 refills | Status: DC

## 2020-05-04 MED ORDER — CONTOUR NEXT MONITOR W/DEVICE KIT: PACK | 0 refills | Status: DC

## 2020-05-04 MED ORDER — METFORMIN HCL ER 500 MG PO TB24: ORAL_TABLET | ORAL | 3 refills | Status: DC

## 2020-05-05 ENCOUNTER — Other Ambulatory Visit: Payer: Self-pay | Admitting: *Deleted

## 2020-05-05 LAB — CULTURE, BLOOD (ROUTINE X 2)
Culture: NO GROWTH
Culture: NO GROWTH
Special Requests: ADEQUATE

## 2020-05-05 MED ORDER — LANCETS MICRO THIN 33G MISC: 1 refills | Status: DC

## 2020-05-05 MED ORDER — CONTOUR NEXT TEST VI STRP: ORAL_STRIP | 1 refills | Status: DC

## 2020-05-15 DIAGNOSIS — R160 Hepatomegaly, not elsewhere classified: Secondary | ICD-10-CM | POA: Insufficient documentation

## 2020-05-15 NOTE — Progress Notes (Signed)
Hospital follow up  Assessment and Plan: Hospital visit follow up for:   Gregory Jacobs was seen today for follow-up.  Diagnoses and all orders for this visit:  Pneumonia due to COVID-19 virus Completed remdesivir and steroid taper Successfully tapered off of O2  Continue working to increase activity at home Follow up CXR due 4 weeks from last, due Mid Sept - order placed  -     CBC with Differential/Platelet -     DG Chest 2 View; Future  Acute respiratory failure with hypoxia (HCC) Successfully off of O2, continue regular exercise in short bursts Sig improvement in last 2 weeks since discharge  Avoid hot/humid air for now; exercise in home or early AM Gradually increase intensity and length as tolerated, goal up to 30-60 min brisk walking Follow up in 2 weeks for reevaluation for appropriateness for returning to work -  Will renew short term disability today   Insulin dependent type 2 diabetes mellitus ([place]) Plan to taper off now that has completed steroid Take 1/2 dose tonight; call with AM results for instructions -     COMPLETE METABOLIC PANEL WITH GFR  Hepatomegaly/fatty liver Weight loss advised, avoid alcohol/tylenol, will monitor LFTs Will refer to GI this year for colonoscopy and evaluation of hepatomegaly  -     COMPLETE METABOLIC PANEL WITH GFR  All medications were reviewed with patient and family and fully reconciled. All questions answered fully, and patient and family members were encouraged to call the office with any further questions or concerns. Discussed goal to avoid readmission related to this diagnosis.   Medications Discontinued During This Encounter  Medication Reason   traMADol (ULTRAM) 50 MG tablet Patient Preference    CAN NOT DO FOR BCBS REGULAR OR MEDICARE Over 40 minutes of exam, counseling, chart review, and complex, high/moderate level critical decision making was performed this visit.   Future Appointments  Date Time Provider [place]   08/04/2020  3:00 PM McClanahan, Gregory Sewer, NP [facility] None     HPI 49 y.o.male presents for follow up for transition from recent hospitalization or SNIF stay. Admit date to the hospital was 04/30/20, patient was discharged from the hospital on 05/03/20 and our clinical staff contacted the office the day after discharge to set up a follow up appointment. The discharge summary, medications, and diagnostic test results were reviewed before meeting with the patient. The patient was admitted for:   Discharge Diagnoses:  Active Problems:   Essential hypertension   Hyperlipidemia associated with type 2 diabetes mellitus ([place])   Stage 2 chronic kidney disease due to type 2 diabetes mellitus ([place])   Pneumonia due to COVID-19 virus   Acute respiratory failure with hypoxia ([place])  Recommendations for Outpatient Follow-up:  1. Follow up with PCP in 1-2 weeks 2. Please obtain BMP/CBC in one week  Home Health: None Equipment/Devices: Oxygen   Brief/Interim Summary: Gregory Jacobs a 49 y.o.malepast medical history significant for hypertension and diabetes who had been recently discharged from the hospital on 04/28/2020 during this time he was treated for SARS-CoV-2 pneumonia with IV steroids and remdesivir by the third day he was weaned to room air and was discharged home to continue prednisone and outpatient treatment at the infusion clinic with IV remdesivir. Unfortunately in the clinic he was found to be hypoxic in the 80s, when he was discharged home he was satting greater than 92% with ambulation on room air. In the ED: He is found to be hypoxic satting 86 he was placed  on 3 L of oxygen and saturations improved, afebrile with a white count of 13 likely due to steroids, 4.6 (when discharged from the hospital 2.2), blood glucose is 263 ferritin of 1014 (when discharged from the hospital 943), procalcitonin is less than 0.1 chest x-ray showed bilateral infiltrate worse on the right.  Patient met as  above with acute hypoxic respiratory failure in the setting of COVID-19 pneumonia.  Patient was previously discharged from hospital stay on room air given his unremarkable oxygen walk screen able to maintain saturations in the 90s without supplementation of oxygen.  Unfortunately at home his symptoms began to worsen and became somewhat hypoxic.  Patient readmitted as above with resumption of Remdesivir and steroids.  Patient drastically improved, appropriately downtrending inflammatory markers and otherwise stable other than requiring 3 L nasal cannula with exertion and rest to maintain saturations but otherwise felt to be stable to discharge home. Patient was discharged with remainder of steroid taper, recommended ongoing quarantine for full 21 days from initial swab per CDC guidelines.   Patient Saturations on Room Air at Rest =88% Patient Saturations on [employer] while Ambulating =84% Patient Saturations on3Liters of oxygen while Ambulating = 92%  05/18/2020 hospital follow up:  BP 122/74    Pulse 91    Temp (!) 97.3 F (36.3 C)    Wt 201 lb (91.2 kg)    SpO2 95%    BMI 25.46 kg/m   49 y.o. male patient with T2DM readmitted as per above for acute hypoxic respiratory failure secondary to covid 19 bil pneumonia presents for 15 day hospital follow up.   He was discharged with 3L O2 which he reports self tapered, has been off since 1 week ago. Reports O2 have ranged 90-95%. Reports has been trying to walk more, has noted still fairly weak, can only walk a few hundred feet before he starts feeling weak, will become short of breath with any significant lifting. He states will only get short of breath if hot and humid (shower, outdoors during peak heat). However if cool can walk at slow pace for 20-30 min, will feel mildly winded and fatigued.   O2 at rest: 95% O2 with 5 min walking: 93% - mildly fatigued and winded  Has been doing incentive spirometer regularly.   T2DM previously well controlled  by metformin and lifestyle, had hyperglycemia with steroid therapy and was initiated on novolin 70/30, doing sliding scale 10-20 units, 150-200 will do 10 units, 200-300 15 units, 300+ 20 units Fasting has been 77-130s, later intake very labile, 200-400 Took last dose of steroid yesterday Today is first day did not take any insulin, checked prior to lunch was 139 Taking metformin 500 mg 4 tabs daily    His blood pressure has been controlled at home, today their BP is BP: 122/74 Lisinopril 5 mg has been held since recent discharge    Home health is not involved.   Images while in the hospital: US Abdomen Limited  Result Date: 04/30/2020 CLINICAL DATA:  Increased LFTs EXAM: ULTRASOUND ABDOMEN LIMITED RIGHT UPPER QUADRANT COMPARISON:  None. FINDINGS: Gallbladder: Cholecystectomy. Common bile duct: Diameter: 4 mm, Liver: Enlarged, measuring 16 cm. No focal lesion identified. Increased parenchymal echogenicity. Portal vein is patent on color Doppler imaging with normal direction of blood flow towards the liver. Other: None. IMPRESSION: Hepatomegaly. Increased liver echogenicity likely reflects steatosis. Electronically Signed   By: Macy Mis M.D.   On: 04/30/2020 17:06    Lab Results  Component Value Date  ALT 81 (H) 05/03/2020   AST 32 05/03/2020   ALKPHOS 50 05/03/2020   BILITOT 1.2 05/03/2020    DG Chest Port 1 View  Result Date: 04/30/2020 CLINICAL DATA:  Hypoxia.  COVID positive. EXAM: PORTABLE CHEST 1 VIEW COMPARISON:  Chest x-rays dated 04/26/2020 and 04/24/2020 FINDINGS: Slightly denser opacity within the LEFT perihilar lung. Persistent patchy opacities along the periphery of the LEFT lung, consistent with pneumonia. Additional pneumonia versus scarring at the RIGHT lung base. No pleural effusion or pneumothorax is seen. Heart size and mediastinal contours are within normal limits. Osseous structures about the chest are unremarkable. IMPRESSION: 1. Multifocal pneumonia, slightly  denser within the LEFT perihilar lung suggesting worsening. 2. Additional pneumonia versus scarring at the RIGHT lung base. Electronically Signed   By: Franki Cabot M.D.   On: 04/30/2020 14:33     Current Outpatient Medications (Endocrine & Metabolic):    insulin NPH-regular Human (NOVOLIN 70/30) (70-30) 100 UNIT/ML injection, 10-20 units 2 to 3 x /day as Directed (Patient taking differently: Inject 10-20 Units into the skin 3 (three) times daily as needed (high blood sugar). )   metFORMIN (GLUCOPHAGE-XR) 500 MG 24 hr tablet, Take 2 tablets 2 x /day with Meals for Diabetes  Current Outpatient Medications (Cardiovascular):    lisinopril (ZESTRIL) 5 MG tablet, TAKE 1 TABLET DAILY FOR BLOOD PRESSURE   rosuvastatin (CRESTOR) 5 MG tablet, Take 5 mg by mouth daily.    nitroGLYCERIN (NITROSTAT) 0.4 MG SL tablet, Place 1 tablet (0.4 mg total) under the tongue every 5 (five) minutes as needed for chest pain.  Current Outpatient Medications (Respiratory):    albuterol (VENTOLIN HFA) 108 (90 Base) MCG/ACT inhaler, Inhale 2 puffs into the lungs every 4 (four) hours as needed for wheezing or shortness of breath.   benzonatate (TESSALON) 200 MG capsule, Take 1 capsule (200 mg total) by mouth 3 (three) times daily.   chlorpheniramine-HYDROcodone (TUSSIONEX) 10-8 MG/5ML SUER, Take 5 mLs by mouth every 12 (twelve) hours as needed for cough.   fexofenadine (ALLEGRA) 180 MG tablet, TAKE 1 TABLET BY MOUTH EVERY DAY (Patient taking differently: Take 180 mg by mouth daily as needed for allergies or rhinitis. )  Current Outpatient Medications (Analgesics):    aspirin EC 81 MG tablet, Take 81 mg by mouth in the morning. Swallow whole.  Current Outpatient Medications (Hematological):    Cyanocobalamin (VITAMIN B-12) 5000 MCG SUBL, Place 5,000 mcg under the tongue daily.  Current Outpatient Medications (Other):    APPLE CIDER VINEGAR PO, Take 2 capsules by mouth daily.    ascorbic acid (VITAMIN C) 500  MG tablet, Take 500-1,000 mg by mouth daily.   Blood Glucose Monitoring Suppl (CONTOUR NEXT MONITOR) w/Device KIT, Check blood sugar 3 times daily-E11.9.   Cholecalciferol (D3-50) 1.25 MG (50000 UT) capsule, Take 1 tablet 3 x /week on Mon Wed & Fri for Severe Vitamin D Deficiency (Patient taking differently: Take 50,000 Units by mouth every 3 (three) days. )   Cinnamon 500 MG capsule, Take 500 mg by mouth 2 (two) times daily.    glucose blood (CONTOUR NEXT TEST) test strip, Check blood sugar 3 times daily-E11.9   Lancets Micro Thin 33G MISC, Check blood sugar 3 times daily-DX-E11.9   VITAMIN E PO, Take 1 capsule by mouth daily.   zinc gluconate 50 MG tablet, Take 200 mg by mouth daily.   Past Medical History:  Diagnosis Date   Chronic kidney disease    Diabetes mellitus without complication ([place]) 3729  Fatty liver    Hypertension 2012     No Known Allergies  ROS: all negative except above.   Physical Exam: Filed Weights   05/18/20 1439  Weight: 201 lb (91.2 kg)   BP 122/74    Pulse 91    Temp (!) 97.3 F (36.3 C)    Wt 201 lb (91.2 kg)    SpO2 95%    BMI 25.46 kg/m  General Appearance: Well nourished, in no apparent distress. Eyes: PERRLA, EOMs, conjunctiva no swelling or erythema Sinuses: No Frontal/maxillary tenderness ENT/Mouth: Ext aud canals clear, TMs without erythema, bulging. No erythema, swelling, or exudate on post pharynx.  Tonsils not swollen or erythematous. Hearing normal.  Neck: Supple, thyroid normal.  Respiratory: Respiratory effort normal, BS diminished throughout, without rales, rhonchi, wheezing or stridor.  Cardio: RRR with no MRGs. Brisk peripheral pulses without edema.  Abdomen: Soft, + BS.  Non tender, no guarding, rebound, hernias, masses. Lymphatics: Non tender without lymphadenopathy.  Musculoskeletal: Full ROM, 5/5 strength, normal gait.  Skin: Warm, dry without rashes, lesions, ecchymosis.  Neuro: Cranial nerves intact. Normal muscle  tone, no cerebellar symptoms. Sensation intact.  Psych: Awake and oriented X 3, normal affect, Insight and Judgment appropriate.     Izora Ribas, NP 3:25 PM [facility]

## 2020-05-18 ENCOUNTER — Ambulatory Visit (INDEPENDENT_AMBULATORY_CARE_PROVIDER_SITE_OTHER): Payer: Managed Care, Other (non HMO) | Admitting: Adult Health

## 2020-05-18 ENCOUNTER — Other Ambulatory Visit: Payer: Self-pay

## 2020-05-18 ENCOUNTER — Encounter: Payer: Self-pay | Admitting: Adult Health

## 2020-05-18 VITALS — BP 122/74 | HR 91 | Temp 97.3°F | Wt 201.0 lb

## 2020-05-18 DIAGNOSIS — I10 Essential (primary) hypertension: Secondary | ICD-10-CM

## 2020-05-18 DIAGNOSIS — J1282 Pneumonia due to coronavirus disease 2019: Secondary | ICD-10-CM

## 2020-05-18 DIAGNOSIS — U071 COVID-19: Secondary | ICD-10-CM

## 2020-05-18 DIAGNOSIS — R160 Hepatomegaly, not elsewhere classified: Secondary | ICD-10-CM

## 2020-05-18 DIAGNOSIS — J9601 Acute respiratory failure with hypoxia: Secondary | ICD-10-CM | POA: Diagnosis not present

## 2020-05-18 DIAGNOSIS — E119 Type 2 diabetes mellitus without complications: Secondary | ICD-10-CM | POA: Diagnosis not present

## 2020-05-18 DIAGNOSIS — Z794 Long term (current) use of insulin: Secondary | ICD-10-CM

## 2020-05-18 DIAGNOSIS — R7989 Other specified abnormal findings of blood chemistry: Secondary | ICD-10-CM

## 2020-05-18 DIAGNOSIS — Z79899 Other long term (current) drug therapy: Secondary | ICD-10-CM

## 2020-05-18 NOTE — Patient Instructions (Addendum)
Continue regular activity and exercise  Walk in cool environment, regularly throughout day, gradually increasing intensity until can do 30-60 min moderate intensity  Now that off of steroid, likely can reduce insulin - plan on 1/2 dose tonight, then reevaluate tomorrow AM  Continue metformin

## 2020-05-19 LAB — CBC WITH DIFFERENTIAL/PLATELET
Absolute Monocytes: 684 cells/uL (ref 200–950)
Basophils Absolute: 84 cells/uL (ref 0–200)
Basophils Relative: 0.7 %
Eosinophils Absolute: 228 cells/uL (ref 15–500)
Eosinophils Relative: 1.9 %
HCT: 45.7 % (ref 38.5–50.0)
Hemoglobin: 15.4 g/dL (ref 13.2–17.1)
Lymphs Abs: 3144 cells/uL (ref 850–3900)
MCH: 30.1 pg (ref 27.0–33.0)
MCHC: 33.7 g/dL (ref 32.0–36.0)
MCV: 89.4 fL (ref 80.0–100.0)
MPV: 11.3 fL (ref 7.5–12.5)
Monocytes Relative: 5.7 %
Neutro Abs: 7860 cells/uL — ABNORMAL HIGH (ref 1500–7800)
Neutrophils Relative %: 65.5 %
Platelets: 154 10*3/uL (ref 140–400)
RBC: 5.11 10*6/uL (ref 4.20–5.80)
RDW: 12.2 % (ref 11.0–15.0)
Total Lymphocyte: 26.2 %
WBC: 12 10*3/uL — ABNORMAL HIGH (ref 3.8–10.8)

## 2020-05-19 LAB — COMPLETE METABOLIC PANEL WITH GFR
AG Ratio: 1.5 (calc) (ref 1.0–2.5)
ALT: 64 U/L — ABNORMAL HIGH (ref 9–46)
AST: 36 U/L — ABNORMAL HIGH (ref 10–35)
Albumin: 3.9 g/dL (ref 3.6–5.1)
Alkaline phosphatase (APISO): 62 U/L (ref 35–144)
BUN: 17 mg/dL (ref 7–25)
CO2: 27 mmol/L (ref 20–32)
Calcium: 9.7 mg/dL (ref 8.6–10.3)
Chloride: 103 mmol/L (ref 98–110)
Creat: 1.22 mg/dL (ref 0.70–1.33)
GFR, Est African American: 80 mL/min/{1.73_m2} (ref >=60)
GFR, Est Non African American: 69 mL/min/{1.73_m2} (ref >=60)
Globulin: 2.6 g/dL (calc) (ref 1.9–3.7)
Glucose, Bld: 147 mg/dL — ABNORMAL HIGH (ref 65–99)
Potassium: 4.1 mmol/L (ref 3.5–5.3)
Sodium: 140 mmol/L (ref 135–146)
Total Bilirubin: 0.9 mg/dL (ref 0.2–1.2)
Total Protein: 6.5 g/dL (ref 6.1–8.1)

## 2020-05-28 NOTE — Progress Notes (Signed)
Assessment and Plan:  Exertional dyspnea Abnormal CXR Personal history of covid-19 Slow recovery following complicated course of covid 19 pneumonia with readmission, resp failure with hypoxia has improved However persistent exertional dyspnea and fatigue - phyiscal nature of job, unable to return Follow up CXR showed ? 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) -     CT Chest Wo Contrast; Future -     Ambulatory referral to Pulmonology -     Budeson-Glycopyrrol-Formoterol (BREZTRI AEROSPHERE) 160-9-4.8 MCG/ACT AERO; Inhale 1 puff into the lungs 2 (two) times daily.  Insulin-requiring or dependent type II diabetes mellitus (HCC) -     insulin NPH-regular Human (NOVOLIN 70/30) (70-30) 100 UNIT/ML injection; Take 5 units BID PRN glucose 150+ prior to breakfast or dinner   Further disposition pending results of labs. Discussed med's effects and SE's.   Over 30 minutes of exam, counseling, chart review, and critical decision making was performed.   Future Appointments  Date Time Provider [place]  08/04/2020  3:00 PM McClanahan, Danton Sewer, NP [facility] None    ------------------------------------------------------------------------------------------------------------------   HPI BP 104/68    Pulse 91    Temp (!) 97 F (36.1 C)    Resp 16    Wt 204 lb (92.5 kg)    SpO2 96%    BMI 25.84 kg/m   49 y.o.male presents for 2 week follow up on T2DM and Covid 19 pneumonia/ resp failure with  hypoxia. He was admitted 8/8-8/10, then readmitted 8/13-8/15. Discharged on steroid taper which caused hyperglycemia, intiated on short term novolin 70/30, also with 2L PRN O2.    He reports in the last 2 weeks after completion of steroid has reduced insulin use, none in the last week, in the last week fasting reportedly ranges 109-120, prior to dinner 110-120s, has instructions to take 5 units if 150+. Watching diet strictly, eating regular small low carb meals.   Typically well controlled on metformin with excellent lifestyle progress and weight loss this past year; reports since discharge is having AM diarrhea after taking 2 tabs of metformin with breakfast, no diarrhea with evening dose.  Lab Results  Component Value Date   HGBA1C 6.2 (H) 04/15/2020    He has been putting effort into increasing activity level, he reports if he walks when hot/humid becomes fairly short of breath, or with any exertion beyond easy walking will become short of breath (pulling cord to start a generator, etc). He reports remains easily fatigued. He reports some shortness of breath upon awakening in AM, will have sense of chest congestion, will cough up small quantities of grey/green mucus intermittently which helps. O2 does currently maintain 96% or so at rest, will note down to 92% with exertion.   EXAM: CHEST - 2 VIEW  COMPARISON:  April 30, 2020    FINDINGS: There remains ill-defined areas of airspace opacity in the mid and lower lung regions without consolidation. An area of ill-defined opacity in the periphery of the  right upper lobe is more apparent than on previous study. There are scattered areas of scarring in the lung bases. Heart size and pulmonary vascularity are normal. No adenopathy. No bone lesions.   IMPRESSION: Patchy airspace opacity bilat remains. Ill-defined opacity in the right upper lobe peripherally is slightly more prominent than on previous study. No areas of consolidation  noted. There is scarring in the lung bases. No adenopathy appreciable by radiography. Heart size and pulmonary vascularity are normal.    Past Medical History:  Diagnosis Date   Acute respiratory failure with hypoxia (HCC)    Chronic kidney disease    Diabetes mellitus without complication ([place]) 8937   Fatty liver    Hypertension 2012     No Known Allergies  Current Outpatient Medications on File Prior to Visit  Medication Sig   albuterol (VENTOLIN HFA) 108 (90 Base) MCG/ACT inhaler Inhale 2 puffs into the lungs every 4 (four) hours as needed for wheezing or shortness of breath.   APPLE CIDER VINEGAR PO Take 2 capsules by mouth daily.    ascorbic acid (VITAMIN C) 500 MG tablet Take 500-1,000 mg by mouth daily.   aspirin EC 81 MG tablet Take 81 mg by mouth in the morning. Swallow whole.   Blood Glucose Monitoring Suppl (CONTOUR NEXT MONITOR) w/Device KIT Check blood sugar 3 times daily-E11.9.   Cholecalciferol (D3-50) 1.25 MG (50000 UT) capsule Take 1 tablet 3 x /week on Mon Wed & Fri for Severe Vitamin D Deficiency (Patient taking differently: Take 50,000 Units by mouth every 3 (three) days. )   Cinnamon 500 MG capsule Take 500 mg by mouth 2 (two) times daily.    Cyanocobalamin (VITAMIN B-12) 5000 MCG SUBL Place 5,000 mcg under the tongue daily.   fexofenadine (ALLEGRA) 180 MG tablet TAKE 1 TABLET BY MOUTH EVERY DAY (Patient taking differently: Take 180 mg by mouth daily as needed for allergies or rhinitis. )   glucose blood (CONTOUR NEXT TEST) test strip Check blood sugar 3 times daily-E11.9   Lancets Micro Thin 33G MISC Check blood sugar 3 times daily-DX-E11.9   rosuvastatin (CRESTOR) 5 MG tablet Take 5 mg by mouth daily.    VITAMIN E PO Take 1 capsule by mouth daily.   zinc gluconate 50 MG tablet Take 200 mg by mouth daily.    chlorpheniramine-HYDROcodone (TUSSIONEX) 10-8 MG/5ML SUER Take 5 mLs by mouth every 12 (twelve) hours as needed for cough. (Patient not  taking: Reported on 06/01/2020)   nitroGLYCERIN (NITROSTAT) 0.4 MG SL tablet Place 1 tablet (0.4 mg total) under the tongue every 5 (five) minutes as needed for chest pain.   No current facility-administered medications on file prior to visit.    ROS: all negative except above.   Physical Exam:  BP 104/68    Pulse 91    Temp (!) 97 F (36.1 C)    Resp 16    Wt 204 lb (92.5 kg)    SpO2 96%    BMI 25.84 kg/m   General Appearance: Well nourished, in no apparent distress. Eyes: PERRLA, EOMs, conjunctiva no swelling or erythema Sinuses: No Frontal/maxillary tenderness ENT/Mouth: Ext aud canals clear, TMs without erythema, bulging. No erythema, swelling, or exudate on post pharynx.  Tonsils not swollen or erythematous. Hearing normal.  Neck: Supple, thyroid normal.  Respiratory: Respiratory effort normal, BS equal bilaterally without rales, rhonchi, wheezing or stridor.  Cardio: RRR with no MRGs. Brisk peripheral pulses without edema.  Abdomen: Soft, + BS.  Non tender, no  guarding, rebound, hernias, masses. Lymphatics: Non tender without lymphadenopathy.  Musculoskeletal: Full ROM, 5/5 strength, normal gait.  Skin: Warm, dry without rashes, lesions, ecchymosis.  Neuro: Cranial nerves intact. Normal muscle tone, no cerebellar symptoms. Sensation intact.  Psych: Awake and oriented X 3, normal affect, Insight and Judgment appropriate.     Izora Ribas, NP 1:13 PM [facility]

## 2020-06-01 ENCOUNTER — Ambulatory Visit (HOSPITAL_COMMUNITY)
Admission: RE | Admit: 2020-06-01 | Discharge: 2020-06-01 | Disposition: A | Discharge status: Home / Self Care | Payer: Managed Care, Other (non HMO) | Source: Ambulatory Visit | Attending: Adult Health | Admitting: Adult Health

## 2020-06-01 ENCOUNTER — Ambulatory Visit (INDEPENDENT_AMBULATORY_CARE_PROVIDER_SITE_OTHER): Payer: Managed Care, Other (non HMO) | Admitting: Adult Health

## 2020-06-01 ENCOUNTER — Encounter: Payer: Self-pay | Admitting: Adult Health

## 2020-06-01 ENCOUNTER — Other Ambulatory Visit: Payer: Self-pay

## 2020-06-01 VITALS — BP 104/68 | HR 91 | Temp 97.0°F | Resp 16 | Wt 204.0 lb

## 2020-06-01 DIAGNOSIS — J1282 Pneumonia due to coronavirus disease 2019: Secondary | ICD-10-CM | POA: Diagnosis present

## 2020-06-01 DIAGNOSIS — E119 Type 2 diabetes mellitus without complications: Secondary | ICD-10-CM

## 2020-06-01 DIAGNOSIS — R0600 Dyspnea, unspecified: Secondary | ICD-10-CM | POA: Diagnosis not present

## 2020-06-01 DIAGNOSIS — Z8616 Personal history of COVID-19: Secondary | ICD-10-CM | POA: Diagnosis not present

## 2020-06-01 DIAGNOSIS — R0609 Other forms of dyspnea: Secondary | ICD-10-CM

## 2020-06-01 DIAGNOSIS — Z794 Long term (current) use of insulin: Secondary | ICD-10-CM

## 2020-06-01 DIAGNOSIS — R9389 Abnormal findings on diagnostic imaging of other specified body structures: Secondary | ICD-10-CM

## 2020-06-01 DIAGNOSIS — U071 COVID-19: Secondary | ICD-10-CM | POA: Diagnosis not present

## 2020-06-01 MED ORDER — BREZTRI AEROSPHERE 160-9-4.8 MCG/ACT IN AERO: 1.0000 | INHALATION_SPRAY | Freq: Two times a day (BID) | RESPIRATORY_TRACT | 0 refills | Status: DC

## 2020-06-01 MED ORDER — NOVOLIN 70/30 (70-30) 100 UNIT/ML ~~LOC~~ SUSP: SUBCUTANEOUS | 3 refills | Status: DC

## 2020-06-01 MED ORDER — METFORMIN HCL ER 500 MG PO TB24: ORAL_TABLET | ORAL | 3 refills | Status: DC

## 2020-06-01 NOTE — Patient Instructions (Signed)
Do 1 puff breztri twice a day - rinse/gargle/brush teeth after to prevent thrush   Budesonide; Glycopyrrolate; Formoterol inhalation aerosol What is this medicine? BUDESONIDE; GLYCOPYRROLATE; FORMOTEROL (byoo DES oh nide; glye koe PYE roe late; for [facility] te rol) inhalation is a combination of 3 medicines. Budesonide decreases inflammation in the lungs. Formoterol and glycopyrrolate are bronchodilators that help keep airways open. This medicine is used to treat chronic obstructive pulmonary disease (COPD). Do not use this drug combination for acute COPD attacks or bronchospasm. This medicine may be used for other purposes; ask your health care provider or pharmacist if you have questions. COMMON BRAND NAME(S): BREZTRI What should I tell my health care provider before I take this medicine? They need to know if you have any of these conditions:  bladder problems or trouble passing urine  bone problems  diabetes  eye disease, vision problems  heart disease  high blood pressure  history of irregular heartbeat  immune system problems  infection  kidney disease  pheochromocytoma  prostate disease  seizures  thyroid disease  an unusual or allergic reaction to budesonide, formoterol, glycopyrrolate, medicines, foods, dyes, or preservatives  pregnant or trying to get pregnant  breast-feeding How should I use this medicine? This medicine is inhaled through the mouth. Follow the directions on the prescription label. Shake well before each use. Rinse your mouth with water after use. Make sure not to swallow the water. Take your medicine at regular intervals. Do not take your medicine more often than directed. Do not stop taking except on your doctor's advice. Make sure that you are using your inhaler correctly. This drug comes with INSTRUCTIONS FOR USE. Ask your pharmacist for directions on how to use this drug. Read the information carefully. Talk to your pharmacist or health care  provider if you have questions. Talk to your pediatrician regarding the use of this medicine in children. This medicine is not approved for use in children. Overdosage: If you think you have taken too much of this medicine contact a poison control center or emergency room at once. NOTE: This medicine is only for you. Do not share this medicine with others. What if I miss a dose? If you miss a dose, use it as soon as you can. If it is almost time for your next dose, use only that dose. Do not use double or extra doses. What may interact with this medicine? Do not take the medicine with any of the following medications:  cisapride  dofetilide  dronedarone  MAOIs like Marplan, Nardil, and Parnate  other medicines that contain long-acting beta-2 agonists (LABAs) like arformoterol, formoterol, indacaterol, olodaterol, salmeterol, vilanterol  pimozide  procarbazine  thioridazine This medicine may also interact with the following medications:  antihistamines for allergy, cough, and cold  atropine  certain antibiotics like clarithromycin, telithromycin  certain antivirals for HIV or hepatitis  certain heart medicines like atenolol, metoprolol  certain medicines for bladder problems like oxybutynin, tolterodine  certain medicines for blood pressure, heart disease, irregular heartbeat  certain medicines for depression, anxiety, or psychotic disturbances  certain medicines for fungal infections like ketoconazole, itraconazole  certain medicines for Parkinson's disease like benztropine, trihexyphenidyl  certain medicines for stomach problems like dicyclomine, hyoscyamine  certain medicines for travel sickness like scopolamine  diuretics  grapefruit juice  mifepristone  other inhaled medicines that contain anticholinergics such as aclidinium, ipratropium, tiotropium, umeclidinium  other medicines that prolong the QT interval (an abnormal heart rhythm)  some  vaccines  steroid medicines  like prednisone or cortisone  stimulant medicines for attention disorders, weight loss, or to stay awake  theophylline This list may not describe all possible interactions. Give your health care provider a list of all the medicines, herbs, non-prescription drugs, or dietary supplements you use. Also tell them if you smoke, drink alcohol, or use illegal drugs. Some items may interact with your medicine. What should I watch for while using this medicine? Visit your health care professional for regular checks on your progress. Tell your health care professional if your symptoms do not start to get better or if they get worse. NEVER use this medicine for an acute asthma or COPD attack. You should use your short-acting rescue inhalers for this purpose. If your symptoms get worse or if you need your short-acting inhalers more often, call your doctor right away. This medicine may increase your risk of getting an infection. Tell your doctor or health care professional if you are around anyone with measles or chickenpox, or if you develop sores or blisters that do not heal properly. Do not get this medicine in your eyes. It can cause irritation, pain, or blurred vision. What side effects may I notice from receiving this medicine? Side effects that you should report to your doctor or health care professional as soon as possible:  allergic reactions like skin rash, itching or hives, swelling of the face, lips, or tongue  anxious  breathing problems  changes in vision, eye pain  muscle cramps or muscle pain  signs and symptoms of a dangerous change in heartbeat or heart rhythm like chest pain; dizziness; fast or irregular heartbeat; palpitations; feeling faint or lightheaded, falls; breathing problems  signs and symptoms of high blood sugar such as being more thirsty or hungry or having to urinate more than normal. You may also feel very tired or have blurry  vision  signs and symptoms of infection like fever; chills; cough; sore throat; pain or trouble passing urine  tremors  trouble passing urine or change in the amount of urine  unusually weak or tired  white patches in the mouth or mouth sores Side effects that usually do not require medical attention (report these to your doctor or health care professional if they continue or are bothersome):  back pain  changes in taste  cough  diarrhea  runny or stuffy nose  upset stomach This list may not describe all possible side effects. Call your doctor for medical advice about side effects. You may report side effects to FDA at 1-800-FDA-1088. Where should I keep my medicine? Keep out of the reach of children. Store in a dry place at room temperature between 15 and 30 degrees C (59 and 86 degrees F). Protect from heat. Do not freeze. Do not use or store near heat or flame, as the canister may burst. Throw away the inhaler 3 months after you open the foil pouch (for the 120-inhalation canister), or 3 weeks after you open the foil pouch (for the 28-inhalation canister), or when the dose indicator reaches zero "0", whichever comes first. NOTE: This sheet is a summary. It may not cover all possible information. If you have questions about this medicine, talk to your doctor, pharmacist, or health care provider.  2020 Elsevier/Gold Standard (2019-04-16 18:58:00)

## 2020-06-02 LAB — COMPLETE METABOLIC PANEL WITH GFR
AG Ratio: 1.8 (calc) (ref 1.0–2.5)
ALT: 41 U/L (ref 9–46)
AST: 32 U/L (ref 10–35)
Albumin: 4.2 g/dL (ref 3.6–5.1)
Alkaline phosphatase (APISO): 60 U/L (ref 35–144)
BUN: 11 mg/dL (ref 7–25)
CO2: 25 mmol/L (ref 20–32)
Calcium: 9.2 mg/dL (ref 8.6–10.3)
Chloride: 107 mmol/L (ref 98–110)
Creat: 1.02 mg/dL (ref 0.70–1.33)
GFR, Est African American: 99 mL/min/{1.73_m2} (ref >=60)
GFR, Est Non African American: 85 mL/min/{1.73_m2} (ref >=60)
Globulin: 2.4 g/dL (calc) (ref 1.9–3.7)
Glucose, Bld: 112 mg/dL — ABNORMAL HIGH (ref 65–99)
Potassium: 3.9 mmol/L (ref 3.5–5.3)
Sodium: 141 mmol/L (ref 135–146)
Total Bilirubin: 0.9 mg/dL (ref 0.2–1.2)
Total Protein: 6.6 g/dL (ref 6.1–8.1)

## 2020-06-02 LAB — CBC WITH DIFFERENTIAL/PLATELET
Absolute Monocytes: 528 cells/uL (ref 200–950)
Basophils Absolute: 88 cells/uL (ref 0–200)
Basophils Relative: 1.1 %
Eosinophils Absolute: 168 cells/uL (ref 15–500)
Eosinophils Relative: 2.1 %
HCT: 44.2 % (ref 38.5–50.0)
Hemoglobin: 15.3 g/dL (ref 13.2–17.1)
Lymphs Abs: 2296 cells/uL (ref 850–3900)
MCH: 30.3 pg (ref 27.0–33.0)
MCHC: 34.6 g/dL (ref 32.0–36.0)
MCV: 87.5 fL (ref 80.0–100.0)
MPV: 11.1 fL (ref 7.5–12.5)
Monocytes Relative: 6.6 %
Neutro Abs: 4920 cells/uL (ref 1500–7800)
Neutrophils Relative %: 61.5 %
Platelets: 259 10*3/uL (ref 140–400)
RBC: 5.05 10*6/uL (ref 4.20–5.80)
RDW: 12.7 % (ref 11.0–15.0)
Total Lymphocyte: 28.7 %
WBC: 8 10*3/uL (ref 3.8–10.8)

## 2020-06-02 LAB — D-DIMER, QUANTITATIVE: D-Dimer, Quant: 0.28 mcg/mL FEU (ref <0.50)

## 2020-06-02 LAB — C-REACTIVE PROTEIN: CRP: 1.7 mg/L (ref <8.0)

## 2020-06-12 ENCOUNTER — Ambulatory Visit
Admission: RE | Admit: 2020-06-12 | Discharge: 2020-06-12 | Disposition: A | Discharge status: Home / Self Care | Payer: Managed Care, Other (non HMO) | Source: Ambulatory Visit | Attending: Adult Health | Admitting: Adult Health

## 2020-06-12 ENCOUNTER — Encounter: Payer: Self-pay | Admitting: Adult Health

## 2020-06-12 DIAGNOSIS — Z8616 Personal history of COVID-19: Secondary | ICD-10-CM

## 2020-06-12 DIAGNOSIS — R0609 Other forms of dyspnea: Secondary | ICD-10-CM

## 2020-06-12 DIAGNOSIS — R9389 Abnormal findings on diagnostic imaging of other specified body structures: Secondary | ICD-10-CM

## 2020-06-24 ENCOUNTER — Ambulatory Visit (INDEPENDENT_AMBULATORY_CARE_PROVIDER_SITE_OTHER): Payer: Managed Care, Other (non HMO) | Admitting: Pulmonary Disease

## 2020-06-24 ENCOUNTER — Encounter (HOSPITAL_COMMUNITY): Payer: Self-pay | Admitting: *Deleted

## 2020-06-24 ENCOUNTER — Encounter: Payer: Self-pay | Admitting: *Deleted

## 2020-06-24 ENCOUNTER — Other Ambulatory Visit: Payer: Self-pay

## 2020-06-24 ENCOUNTER — Encounter: Payer: Self-pay | Admitting: Pulmonary Disease

## 2020-06-24 VITALS — BP 144/80 | HR 82 | Temp 96.8°F | Ht 74.0 in | Wt 207.2 lb

## 2020-06-24 DIAGNOSIS — U099 Post covid-19 condition, unspecified: Secondary | ICD-10-CM | POA: Diagnosis not present

## 2020-06-24 DIAGNOSIS — J984 Other disorders of lung: Secondary | ICD-10-CM | POA: Diagnosis not present

## 2020-06-24 DIAGNOSIS — R0602 Shortness of breath: Secondary | ICD-10-CM | POA: Diagnosis not present

## 2020-06-24 NOTE — Progress Notes (Signed)
Received referral from Dr. Vaughan Browner for this pt to participate in pulmonary rehab with the the diagnosis of Shortness of breath.  Pt with recent hospitalization with Covid Pneumonia . Clinical review of pt follow up appt on 10/6  Pulmonary office note.  Pt with Covid Risk Score - 3. Pt appropriate for scheduling for Pulmonary rehab.  Will forward to support staff for scheduling and verification of insurance eligibility/benefits with pt consent. Cherre Huger, BSN Cardiac and Training and development officer

## 2020-06-24 NOTE — Progress Notes (Signed)
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  Discharged on room air and prednisone taper but became worse and got readmitted on 8/12 with resumption of steroids and remdesivir.  Discharged on 3 L oxygen.  Overall he is improved since discharge with increasing exercise capacity.  He is off oxygen since early September.  Pets: 2 dogs Occupation: Administrator for [employer] Exposures: No known exposures.  No mold, hot tub, Jacuzzi Smoking history: 2015-pack-year smoker.  Quit in 2015 Travel history: No significant travel history Relevant family history: No significant family history of lung disease   Outpatient Encounter Medications as of 06/24/2020  Medication Sig  . albuterol (VENTOLIN HFA) 108 (90 Base) MCG/ACT inhaler Inhale 2 puffs into the lungs every 4 (four) hours as needed for wheezing or shortness of breath.  . APPLE CIDER VINEGAR PO Take 2 capsules by mouth daily.   Marland Kitchen ascorbic acid (VITAMIN C) 500 MG tablet Take 500-1,000 mg by mouth daily.  Marland Kitchen aspirin EC 81 MG tablet Take 81 mg by mouth in the morning. Swallow whole.  . Blood Glucose Monitoring Suppl (CONTOUR NEXT MONITOR) w/Device KIT Check blood sugar 3 times daily-E11.9.  Marland Kitchen Cholecalciferol (D3-50) 1.25 MG (50000 UT) capsule Take 1 tablet 3 x /week on Mon Wed & Fri for Severe Vitamin D Deficiency (Patient taking differently: Take 50,000 Units by mouth every 3 (three) days. )  . Cinnamon 500 MG capsule Take 500 mg by mouth 2 (two) times daily.   . Cyanocobalamin (VITAMIN B-12) 5000 MCG SUBL Place 5,000 mcg under the tongue daily.  . fexofenadine (ALLEGRA) 180 MG tablet TAKE 1 TABLET BY MOUTH  EVERY DAY (Patient taking differently: Take 180 mg by mouth daily as needed for allergies or rhinitis. )  . glucose blood (CONTOUR NEXT TEST) test strip Check blood sugar 3 times daily-E11.9  . Lancets Micro Thin 33G MISC Check blood sugar 3 times daily-DX-E11.9  . metFORMIN (GLUCOPHAGE-XR) 500 MG 24 hr tablet Take 3-4 tabs daily spread over meals for diabetes  . VITAMIN E PO Take 1 capsule by mouth daily.  Marland Kitchen zinc gluconate 50 MG tablet Take 200 mg by mouth daily.   . Budeson-Glycopyrrol-Formoterol (BREZTRI AEROSPHERE) 160-9-4.8 MCG/ACT AERO Inhale 1 puff into the lungs 2 (two) times daily. (Patient not taking: Reported on 06/24/2020)  . chlorpheniramine-HYDROcodone (TUSSIONEX) 10-8 MG/5ML SUER Take 5 mLs by mouth every 12 (twelve) hours as needed for cough. (Patient not taking: Reported on 06/01/2020)  . insulin NPH-regular Human (NOVOLIN 70/30) (70-30) 100 UNIT/ML injection Take 5 units BID PRN glucose 150+ prior to breakfast or dinner  . rosuvastatin (CRESTOR) 5 MG tablet Take 5 mg by mouth daily.   . [DISCONTINUED] nitroGLYCERIN (NITROSTAT) 0.4 MG SL tablet Place 1 tablet (0.4 mg total) under the tongue every 5 (five) minutes as needed for chest pain.   No facility-administered encounter medications on file as of 06/24/2020.    Allergies as of 06/24/2020  . (No Known Allergies)    Past Medical History:  Diagnosis Date  . Acute respiratory failure with hypoxia ([place])   . Chronic kidney disease   . Diabetes mellitus without complication ([place]) 0349  . Fatty  liver   . Hypertension 2012  . Pneumonia due to COVID-19 virus 04/24/2020  . Prostatitis 02/08/2018    Past Surgical History:  Procedure Laterality Date  . CHOLECYSTECTOMY, LAPAROSCOPIC  2011  . VASECTOMY  2016    Family History  Problem Relation Age of Onset  . Breast cancer Mother   . Hypertension Father   . Heart attack Father 4  . Suicidality Sister 74  . Breast cancer Maternal Aunt 60  . Diabetes Paternal Uncle      Social History   Socioeconomic History  . Marital status: Married    Spouse name: Melissa  . Number of children: 3  . Years of education: Not on file  . Highest education level: Not on file  Occupational History  . Not on file  Tobacco Use  . Smoking status: Former Smoker    Packs/day: 0.50    Types: Cigarettes    Start date: 1996    Quit date: 2015    Years since quitting: 6.7  . Smokeless tobacco: Current User    Types: Chew  Vaping Use  . Vaping Use: Never used  Substance and Sexual Activity  . Alcohol use: Not on file    Comment: rarely  . Drug use: No  . Sexual activity: Yes    Birth control/protection: Surgical  Other Topics Concern  . Not on file  Social History Narrative  . Not on file   Social Determinants of Health   Financial Resource Strain:   . Difficulty of Paying Living Expenses: Not on file  Food Insecurity:   . Worried About Charity fundraiser in the Last Year: Not on file  . Ran Out of Food in the Last Year: Not on file  Transportation Needs:   . Lack of Transportation (Medical): Not on file  . Lack of Transportation (Non-Medical): Not on file  Physical Activity:   . Days of Exercise per Week: Not on file  . Minutes of Exercise per Session: Not on file  Stress:   . Feeling of Stress : Not on file  Social Connections:   . Frequency of Communication with Friends and Family: Not on file  . Frequency of Social Gatherings with Friends and Family: Not on file  . Attends Religious Services: Not on file  . Active Member of Clubs or Organizations: Not on file  . Attends Archivist Meetings: Not on file  . Marital Status: Not on file  Intimate Partner Violence:   . Fear of Current or Ex-Partner: Not on file  . Emotionally Abused: Not on file  . Physically Abused: Not on file  . Sexually Abused: Not on file    Review of systems: Review of Systems  Constitutional: Negative for fever and chills.  HENT: Negative.   Eyes: Negative  for blurred vision.  Respiratory: as per HPI  Cardiovascular: Negative for chest pain and palpitations.  Gastrointestinal: Negative for vomiting, diarrhea, blood per rectum. Genitourinary: Negative for dysuria, urgency, frequency and hematuria.  Musculoskeletal: Negative for myalgias, back pain and joint pain.  Skin: Negative for itching and rash.  Neurological: Negative for dizziness, tremors, focal weakness, seizures and loss of consciousness.  Endo/Heme/Allergies: Negative for environmental allergies.  Psychiatric/Behavioral: Negative for depression, suicidal ideas and hallucinations.  All other systems reviewed and are negative.  Physical Exam: Blood pressure (!) 144/80, pulse 82, temperature (!) 96.8 F (36 C), temperature source Skin, height 6' 2"  (1.88 m), weight 207 lb 3.2 oz (94 kg), SpO2 99 %.  Gen:      No acute distress HEENT:  EOMI, sclera anicteric Neck:     No masses; no thyromegaly Lungs:    Clear to auscultation bilaterally; normal respiratory effort CV:         Regular rate and rhythm; no murmurs Abd:      + bowel sounds; soft, non-tender; no palpable masses, no distension Ext:    No edema; adequate peripheral perfusion Skin:      Warm and dry; no rash Neuro: alert and oriented x 3 Psych: normal mood and affect  Data Reviewed: Imaging: Chest x-ray 06/18/2020- patchy bilateral airspace disease ill-defined opacity in the right upper lobe. CT chest 06/12/2020-fibrous bands of tissue consistent with scarring, mild groundglass opacities, coronary artery disease, previous cholecystectomy, aortic atherosclerosis. I have reviewed the images personally.  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  I expected to get better over time though some of the scarring may remain I do not think he will need steroids at this point as clinically he is doing  well and off oxygen.  He is asking about return to work.  I think he should be able to go back but I have advised him not to exert himself immediately but gradually increase exertion We will refer him to pulmonary rehab Get baseline PFTs and high-res CT in 3 months to ensure continued resolution of lung inflammation.  Plan/Recommendations: PFTs, high-res CT in 3 months Okay to go back to work Referral to pulmonary rehab  Marshell Garfinkel MD [facility] 06/24/2020, 10:14 AM  CC: Liane Comber, NP

## 2020-06-24 NOTE — Patient Instructions (Signed)
I have reviewed your CT scan which shows mild inflammation and scarring I expected to get better over time though some of the scarring may remain.  We will have to monitor this going forward  I feel you would be okay to go back to work.  I would advise you to wear a facemask with any dust exposure and avoid fumes We will schedule pulmonary function test as next available Schedule high-resolution CT in 3 months for follow-up imaging of the lung  Follow-up in 3 months after CT scan.

## 2020-06-29 NOTE — Telephone Encounter (Signed)
Dr. Mannam - please advise. Thanks. 

## 2020-06-30 ENCOUNTER — Encounter: Payer: Self-pay | Admitting: *Deleted

## 2020-06-30 NOTE — Progress Notes (Signed)
Letter for work drafted, awaiting a response from patient to find out if he wants it through [place] or wants to pick it up at the office.  Will await response from patient.

## 2020-06-30 NOTE — Telephone Encounter (Signed)
Yes. That is fine with giving a letter saying he is ok to return to work without restrictions.

## 2020-07-01 ENCOUNTER — Telehealth (HOSPITAL_COMMUNITY): Payer: Self-pay

## 2020-07-01 ENCOUNTER — Encounter: Payer: Self-pay | Admitting: *Deleted

## 2020-07-01 NOTE — Telephone Encounter (Signed)
Pt insurance is active and benefits verified through [employer] Co-pay 0, DED $1,300/$1,300 met, out of pocket $3,200/$3,200 met, co-insurance 20%. no pre-authorization required. Passport, 07/01/2020@1 :30pm, REF# [id number]

## 2020-07-17 ENCOUNTER — Other Ambulatory Visit (HOSPITAL_COMMUNITY)
Admission: RE | Admit: 2020-07-17 | Discharge: 2020-07-17 | Disposition: A | Discharge status: Home / Self Care | Payer: Managed Care, Other (non HMO) | Source: Ambulatory Visit | Attending: Pulmonary Disease | Admitting: Pulmonary Disease

## 2020-07-17 DIAGNOSIS — Z01812 Encounter for preprocedural laboratory examination: Secondary | ICD-10-CM | POA: Diagnosis present

## 2020-07-17 DIAGNOSIS — Z20822 Contact with and (suspected) exposure to covid-19: Secondary | ICD-10-CM | POA: Diagnosis not present

## 2020-07-18 LAB — SARS CORONAVIRUS 2 (TAT 6-24 HRS): SARS Coronavirus 2: NEGATIVE

## 2020-07-20 ENCOUNTER — Other Ambulatory Visit: Payer: Self-pay

## 2020-07-20 ENCOUNTER — Ambulatory Visit (INDEPENDENT_AMBULATORY_CARE_PROVIDER_SITE_OTHER): Payer: Managed Care, Other (non HMO) | Admitting: Pulmonary Disease

## 2020-07-20 DIAGNOSIS — R0602 Shortness of breath: Secondary | ICD-10-CM

## 2020-07-20 DIAGNOSIS — J984 Other disorders of lung: Secondary | ICD-10-CM

## 2020-07-20 LAB — PULMONARY FUNCTION TEST
DL/VA % pred: 105 %
DL/VA: 4.58 ml/min/mmHg/L
DLCO cor % pred: 95 %
DLCO cor: 31.66 ml/min/mmHg
DLCO unc % pred: 97 %
DLCO unc: 32.27 ml/min/mmHg
FEF 25-75 Post: 4.04 L/sec
FEF 25-75 Pre: 3.56 L/sec
FEF2575-%Change-Post: 13 %
FEF2575-%Pred-Post: 104 %
FEF2575-%Pred-Pre: 92 %
FEV1-%Change-Post: 4 %
FEV1-%Pred-Post: 97 %
FEV1-%Pred-Pre: 93 %
FEV1-Post: 4.34 L
FEV1-Pre: 4.15 L
FEV1FVC-%Change-Post: 1 %
FEV1FVC-%Pred-Pre: 98 %
FEV6-%Change-Post: 3 %
FEV6-%Pred-Post: 99 %
FEV6-%Pred-Pre: 95 %
FEV6-Post: 5.52 L
FEV6-Pre: 5.31 L
FEV6FVC-%Change-Post: 0 %
FEV6FVC-%Pred-Post: 103 %
FEV6FVC-%Pred-Pre: 102 %
FVC-%Change-Post: 2 %
FVC-%Pred-Post: 96 %
FVC-%Pred-Pre: 94 %
FVC-Post: 5.55 L
FVC-Pre: 5.41 L
Post FEV1/FVC ratio: 78 %
Post FEV6/FVC ratio: 100 %
Pre FEV1/FVC ratio: 77 %
Pre FEV6/FVC Ratio: 99 %
RV % pred: 81 %
RV: 1.85 L
TLC % pred: 87 %
TLC: 6.77 L

## 2020-07-20 NOTE — Progress Notes (Signed)
Full PFT performed today. °

## 2020-07-22 ENCOUNTER — Other Ambulatory Visit: Payer: Self-pay | Admitting: Adult Health

## 2020-07-30 ENCOUNTER — Encounter (HOSPITAL_COMMUNITY): Payer: Self-pay

## 2020-07-30 NOTE — Telephone Encounter (Signed)
Unable to reach pt in regards to pulmonary rehab LMTCB, Mailed letter.

## 2020-08-04 ENCOUNTER — Other Ambulatory Visit: Payer: Self-pay

## 2020-08-04 ENCOUNTER — Encounter: Payer: Self-pay | Admitting: Adult Health Nurse Practitioner

## 2020-08-04 ENCOUNTER — Ambulatory Visit (INDEPENDENT_AMBULATORY_CARE_PROVIDER_SITE_OTHER): Payer: Managed Care, Other (non HMO) | Admitting: Adult Health Nurse Practitioner

## 2020-08-04 ENCOUNTER — Encounter: Payer: Managed Care, Other (non HMO) | Admitting: Adult Health Nurse Practitioner

## 2020-08-04 VITALS — BP 140/82 | HR 67 | Temp 97.5°F | Wt 203.0 lb

## 2020-08-04 DIAGNOSIS — J9601 Acute respiratory failure with hypoxia: Secondary | ICD-10-CM

## 2020-08-04 DIAGNOSIS — R0609 Other forms of dyspnea: Secondary | ICD-10-CM

## 2020-08-04 DIAGNOSIS — R0600 Dyspnea, unspecified: Secondary | ICD-10-CM

## 2020-08-04 DIAGNOSIS — N401 Enlarged prostate with lower urinary tract symptoms: Secondary | ICD-10-CM

## 2020-08-04 DIAGNOSIS — Z125 Encounter for screening for malignant neoplasm of prostate: Secondary | ICD-10-CM | POA: Diagnosis not present

## 2020-08-04 DIAGNOSIS — R350 Frequency of micturition: Secondary | ICD-10-CM

## 2020-08-04 DIAGNOSIS — I10 Essential (primary) hypertension: Secondary | ICD-10-CM | POA: Diagnosis not present

## 2020-08-04 DIAGNOSIS — Z136 Encounter for screening for cardiovascular disorders: Secondary | ICD-10-CM | POA: Diagnosis not present

## 2020-08-04 DIAGNOSIS — J1282 Pneumonia due to coronavirus disease 2019: Secondary | ICD-10-CM

## 2020-08-04 DIAGNOSIS — E119 Type 2 diabetes mellitus without complications: Secondary | ICD-10-CM

## 2020-08-04 DIAGNOSIS — Z13220 Encounter for screening for lipoid disorders: Secondary | ICD-10-CM | POA: Diagnosis not present

## 2020-08-04 DIAGNOSIS — E1169 Type 2 diabetes mellitus with other specified complication: Secondary | ICD-10-CM

## 2020-08-04 DIAGNOSIS — Z79899 Other long term (current) drug therapy: Secondary | ICD-10-CM

## 2020-08-04 DIAGNOSIS — U071 COVID-19: Secondary | ICD-10-CM

## 2020-08-04 DIAGNOSIS — Z1389 Encounter for screening for other disorder: Secondary | ICD-10-CM | POA: Diagnosis not present

## 2020-08-04 DIAGNOSIS — E559 Vitamin D deficiency, unspecified: Secondary | ICD-10-CM

## 2020-08-04 DIAGNOSIS — Z0000 Encounter for general adult medical examination without abnormal findings: Secondary | ICD-10-CM | POA: Diagnosis not present

## 2020-08-04 DIAGNOSIS — N182 Chronic kidney disease, stage 2 (mild): Secondary | ICD-10-CM

## 2020-08-04 DIAGNOSIS — Z1321 Encounter for screening for nutritional disorder: Secondary | ICD-10-CM

## 2020-08-04 DIAGNOSIS — E663 Overweight: Secondary | ICD-10-CM

## 2020-08-04 DIAGNOSIS — Z131 Encounter for screening for diabetes mellitus: Secondary | ICD-10-CM

## 2020-08-04 DIAGNOSIS — Z8616 Personal history of COVID-19: Secondary | ICD-10-CM

## 2020-08-04 DIAGNOSIS — E1122 Type 2 diabetes mellitus with diabetic chronic kidney disease: Secondary | ICD-10-CM

## 2020-08-04 DIAGNOSIS — Z130 Encounter for screening for diseases of the blood and blood-forming organs and certain disorders involving the immune mechanism: Secondary | ICD-10-CM

## 2020-08-04 DIAGNOSIS — G4726 Circadian rhythm sleep disorder, shift work type: Secondary | ICD-10-CM

## 2020-08-04 MED ORDER — TRAZODONE HCL 100 MG PO TABS
100.0000 mg | ORAL_TABLET | Freq: Every day | ORAL | 1 refills | Status: DC
Start: 2020-08-04 — End: 2020-10-14

## 2020-08-04 MED ORDER — ARMODAFINIL 150 MG PO TABS: 150.0000 mg | ORAL_TABLET | Freq: Every day | ORAL | 0 refills | Status: DC

## 2020-08-04 NOTE — Progress Notes (Signed)
COMPLETE PHYSICAL  Assessment and Plan:  Gregory Jacobs was seen today for annual exam.  Diagnoses and all orders for this visit:  Essential hypertension No medications Monitor blood pressure at home; call if consistently over 130/80 Continue DASH diet.   Reminder to go to the ER if any CP, SOB, nausea, dizziness, severe HA, changes vision/speech, left arm numbness and tingling and jaw pain. -     CBC with Differential/Platelet -     COMPLETE METABOLIC PANEL WITH GFR -     TSH -     Magnesium  Hyperlipidemia associated with type 2 diabetes mellitus (HCC) No medications Discussed dietary and exercise modifications Low fat diet -     Lipid panel  Type 2 diabetes mellitus with stage 2 chronic kidney disease, without long-term current use of insulin (HCC) Continue medications: Metformin 591m two tablets BID. Discussed decreasing dose pending A1c resutls. Discussed general issues about diabetes pathophysiology and management. Education: Reviewed 'ABCs' of diabetes management (respective goals in parentheses):  A1C (<7), blood pressure (<130/80), and cholesterol (LDL <70) Dietary recommendations Encouraged aerobic exercise.  Discussed foot care, check daily Yearly retinal exam Dental exam every 6 months Monitor blood glucose, discussed goal for patient -     Hemoglobin A1c  Stage 2 chronic kidney disease due to type 2 diabetes mellitus (HCC) Increase fluids  Avoid NSAIDS Blood pressure control Monitor sugars  Will continue to monitor  Personal history of COVID-19 -     SARS-CoV-2 Semi-Quantitative Total Antibody, Spike  Exertional dyspnea Pneumonia due to COVID-19 virus S/P acute respiratory failure during hospitalization Has follow up CT scheduled  Vitamin D deficiency Continue supplementation to maintain goal of 70-100 Taking Vitamin D 50,000IU once a week IU  Defer vitamin D level  Overweight (BMI 25.0-29.9) Discussed dietary and exercise modifications Encouraged  healthy behaviors, continue lifestyle modifications  Medication management Continued  Screening for blood or protein in urine -     Urinalysis w microscopic + reflex cultur -     Microalbumin / creatinine urine ratio -     REFLEXIVE URINE CULTURE  Encounter for vitamin deficiency screening -     Vitamin B12  Screening, anemia, deficiency, iron -     Iron,Total/Total Iron Binding Cap  Screening PSA (prostate specific antigen) -     PSA  Shift work sleep disorder -     Armodafinil 150 MG tablet; Take 1 tablet (150 mg total) by mouth daily.  Other orders -     traZODone (DESYREL) 100 MG tablet; Take 1 tablet (100 mg total) by mouth at bedtime.    Further disposition pending results if labs check today. Discussed med's effects and SE's.   Over 40 minutes of face to face interview, exam, counseling, chart review, and critical decision making was performed.    Future Appointments  Date Time Provider DTra[place] 08/04/2021  9:00 AM Yuliana Vandrunen, KDanton Sewer NP [facility] None    ----------------------------------------------------------------------------------------------------------------------  HPI 49y.o. male  presents for complete physical and follow up on HTN, HLD, DMII, weight and vitamin D deficiency.   Recent headache that is intermittent.  He reports pressure behind eyes.  He has not taken anything for this.  He does have history of allergies and previously tMicrobiologist  Reports he has not been taking this regularly.  Denies any cough or sore throat.  Patient reports overall he is feeling great.  He is down 4 lbs since last OV and down 13lbs since August.  He has been focused  on management of DMII and weight.   Patient had SARS-COV2 04/26/20.  He was hospitalized related to shortness of breath.  He did not require mechanical ventilation.  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  He works for [employer] night shift.  He has disturbance with sleep and wake  cycle.  He has been using armodafinil and this has been helping.  07/20/20-Had PFT evaluation by pulmonology.  He works night shift and reports he is waking to urinate at least 1-2 times.  He does not drink any liquids few hours before laying down.  Reports he does take alll of his supplements in the morning, before he goes to sleep.  Last OV9/13/21 for follow up on DMII and Covid 19 pneumonia/ resp failure with hypoxia. He was admitted 8/8-8/10, then readmitted 8/13-8/15. Discharged on steroid taper which caused hyperglycemia, intiated on short term novolin 70/30, also with 2L PRN O2.     BMI is Body mass index is 26.06 kg/m., he has been working on diet and exercise. Reports has cut out soda, doing lots of [place] wraps, eggs and fruit, some atkins bars while at work.  Does sugar free flavors in water.  Wt Readings from Last 3 Encounters:  08/04/20 203 lb (92.1 kg)  06/24/20 207 lb 3.2 oz (94 kg)  06/01/20 204 lb (92.5 kg)   His blood pressure has been controlled at home (120's/70's etc), today their BP is BP: 140/82  He does not workout but work physically active job. He denies chest pain, shortness of breath, dizziness.   He is not on cholesterol medication (was prescribed rosuvastatin 5 mg at last visit but never started, strongly prefers to work on lifestyle) and denies myalgias. His cholesterol is not at goal. The cholesterol last visit was:   Lab Results  Component Value Date   CHOL 170 08/04/2020   HDL 36 (L) 08/04/2020   LDLCALC 109 (H) 08/04/2020   TRIG 136 08/04/2020   CHOLHDL 4.7 08/04/2020    He has been working on diet and exercise for DMII on metformin (2000 mg), and denies foot ulcerations, increased appetite, nausea, paresthesia of the feet, polydipsia, polyuria, visual disturbances, vomiting and weight loss.  He checks fasting glucose intermittently 89-120s Last A1C in the office was:  Lab Results  Component Value Date   HGBA1C 5.6 08/04/2020   Lab Results   Component Value Date   GFRNONAA 87 08/04/2020   Patient is on Vitamin D supplement and below goal at last check:    Lab Results  Component Value Date   VD25OH 37 07/30/2019        Current Medications:  Current Outpatient Medications on File Prior to Visit  Medication Sig  . albuterol (VENTOLIN HFA) 108 (90 Base) MCG/ACT inhaler Inhale 2 puffs into the lungs every 4 (four) hours as needed for wheezing or shortness of breath.  . APPLE CIDER VINEGAR PO Take 2 capsules by mouth daily.   Marland Kitchen ascorbic acid (VITAMIN C) 500 MG tablet Take 500-1,000 mg by mouth daily.  Marland Kitchen aspirin EC 81 MG tablet Take 81 mg by mouth in the morning. Swallow whole.  . Blood Glucose Monitoring Suppl (CONTOUR NEXT MONITOR) w/Device KIT Check blood sugar 3 times daily-E11.9.  Marland Kitchen Cholecalciferol (D3-50) 1.25 MG (50000 UT) capsule Take 1 tablet 3 x /week on Mon Wed & Fri for Severe Vitamin D Deficiency (Patient taking differently: Take 50,000 Units by mouth every 3 (three) days. )  . Cinnamon 500 MG capsule  Take 500 mg by mouth 2 (two) times daily.   . Cyanocobalamin (VITAMIN B-12) 5000 MCG SUBL Place 5,000 mcg under the tongue daily.  . fexofenadine (ALLEGRA) 180 MG tablet TAKE 1 TABLET BY MOUTH EVERY DAY (Patient taking differently: Take 180 mg by mouth daily as needed for allergies or rhinitis. )  . glucose blood (CONTOUR NEXT TEST) test strip Check blood sugar 3 times daily-E11.9  . Lancets Micro Thin 33G MISC Check blood sugar 3 times daily-DX-E11.9  . metFORMIN (GLUCOPHAGE-XR) 500 MG 24 hr tablet Take 3-4 tabs daily spread over meals for diabetes  . VITAMIN E PO Take 1 capsule by mouth daily.  Marland Kitchen zinc gluconate 50 MG tablet Take 200 mg by mouth daily.    No current facility-administered medications on file prior to visit.     Allergies: No Known Allergies   Medical History:  Past Medical History:  Diagnosis Date  . Acute respiratory failure with hypoxia ([place])   . Chronic kidney disease   . Diabetes mellitus  without complication ([place]) 2751  . Fatty liver   . Hypertension 2012  . Pneumonia due to COVID-19 virus 04/24/2020  . Prostatitis 02/08/2018   Family history- Reviewed and unchanged Social history- Reviewed and unchanged   Review of Systems:  Review of Systems  Constitutional: Negative for chills, diaphoresis, fever, malaise/fatigue and weight loss.  HENT: Negative for congestion, ear discharge, ear pain, hearing loss, nosebleeds, sinus pain, sore throat and tinnitus.   Eyes: Negative for blurred vision, double vision, photophobia, pain, discharge and redness.  Respiratory: Negative for cough, hemoptysis, sputum production, shortness of breath, wheezing and stridor.   Cardiovascular: Negative for chest pain, palpitations, orthopnea, claudication, leg swelling and PND.  Gastrointestinal: Negative for abdominal pain, blood in stool, constipation, diarrhea, heartburn, melena, nausea and vomiting.  Genitourinary: Negative for dysuria, flank pain, frequency, hematuria and urgency.  Musculoskeletal: Negative for back pain, falls, joint pain, myalgias and neck pain.  Skin: Negative for itching and rash.  Neurological: Negative for dizziness, tingling, tremors, sensory change, speech change, focal weakness, seizures, loss of consciousness, weakness and headaches.  Endo/Heme/Allergies: Negative for environmental allergies and polydipsia. Does not bruise/bleed easily.  Psychiatric/Behavioral: Negative for depression, hallucinations, memory loss, substance abuse and suicidal ideas. The patient is not nervous/anxious and does not have insomnia.     Physical Exam: BP 140/82   Pulse 67   Temp (!) 97.5 F (36.4 C)   Wt 203 lb (92.1 kg)   SpO2 97%   BMI 26.06 kg/m  Wt Readings from Last 3 Encounters:  08/04/20 203 lb (92.1 kg)  06/24/20 207 lb 3.2 oz (94 kg)  06/01/20 204 lb (92.5 kg)   General Appearance: Well nourished adults male, appears to be feeling unwell but in no apparent  distress. Eyes: PERRLA, conjunctiva no swelling or erythema Sinuses: No Frontal/maxillary tenderness ENT/Mouth: Ext aud canals clear, TMs without erythema, bulging. Post pharynx without swelling, or exudate on post pharynx.  Tonsils not swollen or erythematous. Hearing normal.  Neck: Supple Respiratory: Respiratory effort normal, BS equal bilaterally without rales, rhonchi, wheezing or stridor.  Cardio: RRR with no MRGs. Brisk peripheral pulses without edema.  Abdomen: Soft, + BS.  Non tender, no rebound, no palpable organomegaly.  Lymphatics: Non tender without lymphadenopathy.  Musculoskeletal: Full ROM, 5/5 strength, Normal gait Skin: Warm, dry, no lesions, ecchymosis, rash.  Neuro: Cranial nerves intact. No cerebellar symptoms. Distal sensation intact.  Psych: Awake and oriented X 3, normal affect, Insight and Judgment appropriate.  EKG: deferred today las 05/04/20  Garnet Sierras, Laqueta Jean, DNP [facility] [id number]/2021  4:17 PM

## 2020-08-05 LAB — HEMOGLOBIN A1C
Hgb A1c MFr Bld: 5.6 % of total Hgb (ref <5.7)
Mean Plasma Glucose: 114 (calc)
eAG (mmol/L): 6.3 (calc)

## 2020-08-05 LAB — COMPLETE METABOLIC PANEL WITH GFR
AG Ratio: 2 (calc) (ref 1.0–2.5)
ALT: 23 U/L (ref 9–46)
AST: 20 U/L (ref 10–35)
Albumin: 4.7 g/dL (ref 3.6–5.1)
Alkaline phosphatase (APISO): 53 U/L (ref 35–144)
BUN: 16 mg/dL (ref 7–25)
CO2: 25 mmol/L (ref 20–32)
Calcium: 10.4 mg/dL — ABNORMAL HIGH (ref 8.6–10.3)
Chloride: 105 mmol/L (ref 98–110)
Creat: 1 mg/dL (ref 0.70–1.33)
GFR, Est African American: 101 mL/min/{1.73_m2} (ref >=60)
GFR, Est Non African American: 87 mL/min/{1.73_m2} (ref >=60)
Globulin: 2.4 g/dL (calc) (ref 1.9–3.7)
Glucose, Bld: 84 mg/dL (ref 65–99)
Potassium: 4 mmol/L (ref 3.5–5.3)
Sodium: 143 mmol/L (ref 135–146)
Total Bilirubin: 0.9 mg/dL (ref 0.2–1.2)
Total Protein: 7.1 g/dL (ref 6.1–8.1)

## 2020-08-05 LAB — URINALYSIS W MICROSCOPIC + REFLEX CULTURE
Bacteria, UA: NONE SEEN /HPF
Bilirubin Urine: NEGATIVE
Glucose, UA: NEGATIVE
Hgb urine dipstick: NEGATIVE
Hyaline Cast: NONE SEEN /LPF
Ketones, ur: NEGATIVE
Leukocyte Esterase: NEGATIVE
Nitrites, Initial: NEGATIVE
Protein, ur: NEGATIVE
RBC / HPF: NONE SEEN /HPF (ref 0–2)
Specific Gravity, Urine: 1.012 (ref 1.001–1.03)
Squamous Epithelial / HPF: NONE SEEN /HPF (ref <=5)
WBC, UA: NONE SEEN /HPF (ref 0–5)
pH: 5.5 (ref 5.0–8.0)

## 2020-08-05 LAB — LIPID PANEL
Cholesterol: 170 mg/dL (ref <200)
HDL: 36 mg/dL — ABNORMAL LOW (ref >=40)
LDL Cholesterol (Calc): 109 mg/dL (calc) — ABNORMAL HIGH
Non-HDL Cholesterol (Calc): 134 mg/dL (calc) — ABNORMAL HIGH (ref <130)
Total CHOL/HDL Ratio: 4.7 (calc) (ref <5.0)
Triglycerides: 136 mg/dL (ref <150)

## 2020-08-05 LAB — CBC WITH DIFFERENTIAL/PLATELET
Absolute Monocytes: 677 cells/uL (ref 200–950)
Basophils Absolute: 94 cells/uL (ref 0–200)
Basophils Relative: 1 %
Eosinophils Absolute: 254 cells/uL (ref 15–500)
Eosinophils Relative: 2.7 %
HCT: 50.3 % — ABNORMAL HIGH (ref 38.5–50.0)
Hemoglobin: 16.7 g/dL (ref 13.2–17.1)
Lymphs Abs: 3243 cells/uL (ref 850–3900)
MCH: 29 pg (ref 27.0–33.0)
MCHC: 33.2 g/dL (ref 32.0–36.0)
MCV: 87.3 fL (ref 80.0–100.0)
MPV: 11.2 fL (ref 7.5–12.5)
Monocytes Relative: 7.2 %
Neutro Abs: 5132 cells/uL (ref 1500–7800)
Neutrophils Relative %: 54.6 %
Platelets: 251 10*3/uL (ref 140–400)
RBC: 5.76 10*6/uL (ref 4.20–5.80)
RDW: 12 % (ref 11.0–15.0)
Total Lymphocyte: 34.5 %
WBC: 9.4 10*3/uL (ref 3.8–10.8)

## 2020-08-05 LAB — MAGNESIUM: Magnesium: 2.1 mg/dL (ref 1.5–2.5)

## 2020-08-05 LAB — NO CULTURE INDICATED

## 2020-08-05 LAB — TSH: TSH: 1.9 mIU/L (ref 0.40–4.50)

## 2020-08-05 LAB — MICROALBUMIN / CREATININE URINE RATIO
Creatinine, Urine: 76 mg/dL (ref 20–320)
Microalb Creat Ratio: 4 mcg/mg creat (ref <30)
Microalb, Ur: 0.3 mg/dL

## 2020-08-05 LAB — IRON, TOTAL/TOTAL IRON BINDING CAP
%SAT: 31 % (calc) (ref 20–48)
Iron: 107 ug/dL (ref 50–180)
TIBC: 347 mcg/dL (calc) (ref 250–425)

## 2020-08-05 LAB — PSA: PSA: 0.42 ng/mL (ref <=4.0)

## 2020-08-05 LAB — VITAMIN B12: Vitamin B-12: 658 pg/mL (ref 200–1100)

## 2020-08-09 ENCOUNTER — Other Ambulatory Visit: Payer: Self-pay | Admitting: Internal Medicine

## 2020-08-09 DIAGNOSIS — R310 Gross hematuria: Secondary | ICD-10-CM

## 2020-08-09 MED ORDER — SULFAMETHOXAZOLE-TRIMETHOPRIM 800-160 MG PO TABS: ORAL_TABLET | ORAL | 0 refills | Status: DC

## 2020-08-10 ENCOUNTER — Other Ambulatory Visit: Payer: Self-pay | Admitting: Internal Medicine

## 2020-08-10 ENCOUNTER — Telehealth: Payer: Self-pay | Admitting: *Deleted

## 2020-08-10 DIAGNOSIS — G4726 Circadian rhythm sleep disorder, shift work type: Secondary | ICD-10-CM | POA: Insufficient documentation

## 2020-08-10 DIAGNOSIS — R310 Gross hematuria: Secondary | ICD-10-CM

## 2020-08-10 MED ORDER — SULFAMETHOXAZOLE-TRIMETHOPRIM 800-160 MG PO TABS: ORAL_TABLET | ORAL | 0 refills | Status: DC

## 2020-08-10 NOTE — Telephone Encounter (Signed)
Appointment with Jiles Crocker PA, at [facility] on 08/10/2020 at 10:00 AM for blood at start of urine stream. Patient is aware.

## 2020-08-11 LAB — SARS-COV-2 SEMI-QUANTITATIVE TOTAL ANTIBODY, SPIKE: SARS COV2 AB, Total Spike Semi QN: 819 U/mL — ABNORMAL HIGH (ref <0.8)

## 2020-08-17 ENCOUNTER — Telehealth (HOSPITAL_COMMUNITY): Payer: Self-pay

## 2020-08-17 NOTE — Telephone Encounter (Signed)
No response from pt.  Closed referral  

## 2020-08-25 ENCOUNTER — Other Ambulatory Visit: Payer: Self-pay | Admitting: Adult Health Nurse Practitioner

## 2020-08-25 DIAGNOSIS — J329 Chronic sinusitis, unspecified: Secondary | ICD-10-CM

## 2020-08-25 MED ORDER — AZITHROMYCIN 250 MG PO TABS: ORAL_TABLET | ORAL | 1 refills | Status: AC

## 2020-08-27 ENCOUNTER — Other Ambulatory Visit: Payer: Self-pay | Admitting: Adult Health Nurse Practitioner

## 2020-08-27 DIAGNOSIS — J0140 Acute pansinusitis, unspecified: Secondary | ICD-10-CM

## 2020-08-27 MED ORDER — DEXAMETHASONE 4 MG PO TABS: ORAL_TABLET | ORAL | 1 refills | Status: DC

## 2020-08-27 NOTE — Progress Notes (Signed)
Continued facial pressure, oltagia improved.  Continue Neti pot.  Increase Metformin while taking dexamethasone and monitor blood glucose closely.  Taper pack sent in for patient.  Garnet Sierras, Laqueta Jean, DNP [facility] 08/27/2020  8:57 AM

## 2020-08-31 NOTE — Addendum Note (Signed)
Addended byGarnet Sierras A on: 08/31/2020 05:39 PM   Modules accepted: Orders

## 2020-09-19 ENCOUNTER — Other Ambulatory Visit: Payer: Self-pay | Admitting: Adult Health

## 2020-10-02 ENCOUNTER — Encounter: Payer: Self-pay | Admitting: Adult Health

## 2020-10-02 DIAGNOSIS — K869 Disease of pancreas, unspecified: Secondary | ICD-10-CM | POA: Insufficient documentation

## 2020-10-02 DIAGNOSIS — R932 Abnormal findings on diagnostic imaging of liver and biliary tract: Secondary | ICD-10-CM | POA: Insufficient documentation

## 2020-10-08 ENCOUNTER — Ambulatory Visit: Payer: 59 | Admitting: Adult Health

## 2020-10-08 ENCOUNTER — Other Ambulatory Visit: Payer: Self-pay

## 2020-10-08 ENCOUNTER — Encounter: Payer: Self-pay | Admitting: Adult Health

## 2020-10-08 VITALS — Temp 98.4°F

## 2020-10-08 DIAGNOSIS — Z20822 Contact with and (suspected) exposure to covid-19: Secondary | ICD-10-CM | POA: Diagnosis not present

## 2020-10-08 DIAGNOSIS — J069 Acute upper respiratory infection, unspecified: Secondary | ICD-10-CM | POA: Diagnosis not present

## 2020-10-08 NOTE — Progress Notes (Signed)
THIS ENCOUNTER IS A VIRTUAL VISIT DUE TO COVID-19 - PATIENT WAS NOT SEEN IN THE OFFICE.  PATIENT HAS CONSENTED TO VIRTUAL VISIT / TELEMEDICINE VISIT   Virtual Visit via telephone Note  I connected with Gregory Jacobs on 10/08/2020 by telephone.  I verified that I am speaking with the correct person using two identifiers.    I discussed the limitations of evaluation and management by telemedicine and the availability of in person appointments. The patient expressed understanding and agreed to proceed.  History of Present Illness:  Temp 98.4 F (36.9 C)   SpO2 94%   50 y.o.  patient with hx of T2DM, former smoker contacted office due to URI sx concerned about possible covid 19. His daughter tested + for covid 19 this week; he is unvaccinated, was admitted 04/2020 for severe covid 19 with residual dyspnea (has been pulm, pending follow up CT next week). He reports presented for PCR testing this AM and results pending.   OV was completed via telephone visit to minimize exposure in office.   He reports woke up with sore throat yesterday, nasal congestion, some rhinitis, today has sense of settling into chest and was very concerned due to experience with last covid 19 case in July/August 2021. He reports mild cough, mildly prodctive of scant yellow/green/grey phlegm; mostly nasal discharge and stuffy. Denies HA, sinus pain. Denies fever/chills, dyspnea, wheezing, chest aching/pain.   Has been doing nettie pot saline irrigations, taking allegra daily, continues zinc/vit D/vit C supplement, takes bASA daily. Has sudafed to take if needed, hasn't tried this.    He reports O2 persistently 94-95%, somewhat low at baseline following last covid 19 episode.   Exposures: daughter tested positive this week   Vaccination: none  He has done very well with lifestyle changes, weight loss; diabetes very well controlled; reports fasting glucose 115 this AM. Prefers to avoid steroid taper -  Lab Results   Component Value Date   HGBA1C 5.6 08/04/2020      Medications  Current Outpatient Medications (Endocrine & Metabolic):  .  metFORMIN (GLUCOPHAGE-XR) 500 MG 24 hr tablet, Take 3-4 tabs daily spread over meals for diabetes   Current Outpatient Medications (Respiratory):  .  albuterol (VENTOLIN HFA) 108 (90 Base) MCG/ACT inhaler, Inhale 2 puffs into the lungs every 4 (four) hours as needed for wheezing or shortness of breath. .  fexofenadine (ALLEGRA) 180 MG tablet, Take     1 tablet      Daily        for Allergies  Current Outpatient Medications (Analgesics):  .  aspirin EC 81 MG tablet, Take 81 mg by mouth in the morning. Swallow whole.  Current Outpatient Medications (Hematological):  Marland Kitchen  Cyanocobalamin (VITAMIN B-12) 5000 MCG SUBL, Place 5,000 mcg under the tongue daily.  Current Outpatient Medications (Other):  Marland Kitchen  APPLE CIDER VINEGAR PO, Take 2 capsules by mouth daily.  .  Armodafinil 150 MG tablet, Take 1 tablet (150 mg total) by mouth daily. Marland Kitchen  ascorbic acid (VITAMIN C) 500 MG tablet, Take 500-1,000 mg by mouth daily. .  Blood Glucose Monitoring Suppl (CONTOUR NEXT MONITOR) w/Device KIT, Check blood sugar 3 times daily-E11.9. Marland Kitchen  Cholecalciferol (D3-50) 1.25 MG (50000 UT) capsule, Take 1 tablet 3 x /week on Mon Wed & Fri for Severe Vitamin D Deficiency (Patient taking differently: Take 50,000 Units by mouth every 3 (three) days.) .  Cinnamon 500 MG capsule, Take 500 mg by mouth 2 (two) times daily.  Marland Kitchen  glucose  blood (CONTOUR NEXT TEST) test strip, Check blood sugar 3 times daily-E11.9 .  Lancets Micro Thin 33G MISC, Check blood sugar 3 times daily-DX-E11.9 .  traZODone (DESYREL) 100 MG tablet, Take 1 tablet (100 mg total) by mouth at bedtime. Marland Kitchen  VITAMIN E PO, Take 1 capsule by mouth daily. Marland Kitchen  zinc gluconate 50 MG tablet, Take 200 mg by mouth daily.   Allergies: No Known Allergies  Problem list He has Essential hypertension; Hyperlipidemia associated with type 2 diabetes  mellitus ([place]); Type 2 diabetes mellitus with kidney complication ([place]); Vitamin D deficiency; Fatty liver; Overweight (BMI 25.0-29.9); Prostate nodule; Stage 2 chronic kidney disease due to type 2 diabetes mellitus ([place]); Pityriasis rosea; Abnormal EKG; Aortic atherosclerosis ([place]); Personal history of covid-19 (04/16/2020); Exertional dyspnea; Hepatomegaly; Shift work sleep disorder; Abnormal CT of liver; and Pancreatic lesion on their problem list.   Social History:   reports that he quit smoking about 7 years ago. His smoking use included cigarettes. He started smoking about 26 years ago. He smoked 0.50 packs per day. His smokeless tobacco use includes chew. He reports that he does not use drugs. No history on file for alcohol use.   Observations/Objective:  General : Well sounding patient in no apparent distress HEENT: no hoarseness, no cough for duration of visit, nasal vocal quality Lungs: speaks in complete sentences, no audible wheezing, no apparent distress Neurological: alert, oriented x 3 Psychiatric: pleasant, judgement appropriate   Assessment and Plan:  URI with cough and congestion Exposure to confirmed covid 19 case Pending PCR results Currently symptoms are mild  Risk factors include T2DM, htn, unvaccinated He can take mucinex with increased fluids for chest congestion, promethazine DM (has at home) PRN progressive cough Sudafed/flonase PRN for nasal congestion, continue saline irrigations Discussed the importance of avoiding unnecessary antibiotic therapy. Suggested symptomatic OTC remedies. Follow up as needed.  If covid + Regular breathing exercises, proning EC bASA daily for clot prevention unless contraindicated, regular walking/calf exercises Take tylenol PRN temp 101+ Push hydration Sx supportive therapy Declines oral steroids Immune support with vitamin C, zinc, vitamin D reviewed Follow up via mychart or telephone if needed Advised patient obtain O2  monitor; present to ED if persistently <88% or with severe dyspnea, any CP, fever uncontrolled by tylenol, confusion, sudden decline Should remain in isolation until at least 10 days from onset of sx, 24-48 hours fever free without tylenol, sx such as cough are improved.     Follow Up Instructions:  I discussed the assessment and treatment plan with the patient. The patient was provided an opportunity to ask questions and all were answered. The patient agreed with the plan and demonstrated an understanding of the instructions.   The patient was advised to call back or seek an in-person evaluation if the symptoms worsen or if the condition fails to improve as anticipated.  I provided 20 minutes of non-face-to-face time during this encounter.   Izora Ribas, NP

## 2020-10-09 ENCOUNTER — Other Ambulatory Visit: Payer: Self-pay | Admitting: Adult Health

## 2020-10-09 MED ORDER — DOXYCYCLINE HYCLATE 100 MG PO CAPS: ORAL_CAPSULE | ORAL | 0 refills | Status: DC

## 2020-10-09 MED ORDER — DEXAMETHASONE 1 MG PO TABS: ORAL_TABLET | ORAL | 0 refills | Status: DC

## 2020-10-14 ENCOUNTER — Other Ambulatory Visit: Payer: Self-pay | Admitting: Adult Health Nurse Practitioner

## 2020-10-14 ENCOUNTER — Telehealth: Payer: Self-pay | Admitting: Pulmonary Disease

## 2020-10-14 ENCOUNTER — Encounter: Payer: Self-pay | Admitting: Internal Medicine

## 2020-10-14 NOTE — Telephone Encounter (Signed)
Spoke with the pt   He had abd ct last wk  Asking if he needs to keep the appt for his HRCT  I advised yes, since the abd ct is not high res and does not visualize all of his chest  He verbalized understanding  Nothing further needed

## 2020-10-15 ENCOUNTER — Ambulatory Visit (INDEPENDENT_AMBULATORY_CARE_PROVIDER_SITE_OTHER)
Admission: RE | Admit: 2020-10-15 | Discharge: 2020-10-15 | Disposition: A | Discharge status: Home / Self Care | Payer: 59 | Source: Ambulatory Visit | Attending: Pulmonary Disease | Admitting: Pulmonary Disease

## 2020-10-15 ENCOUNTER — Other Ambulatory Visit: Payer: Self-pay

## 2020-10-15 DIAGNOSIS — J984 Other disorders of lung: Secondary | ICD-10-CM | POA: Diagnosis not present

## 2020-10-19 NOTE — Telephone Encounter (Signed)
Please advise on patient mychart message  Still haven't gotten any response to my CT scan last week.   I would like for the Dr or somebody to explain to me what the results  says and means.  Thank you for your time.

## 2020-10-20 NOTE — Telephone Encounter (Signed)
CT shows very minimal changes of scarring which is a result of Covid infection.  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  Please have him follow-up with his primary care regarding these issues.

## 2020-10-22 ENCOUNTER — Encounter: Payer: Self-pay | Admitting: Internal Medicine

## 2020-10-26 ENCOUNTER — Other Ambulatory Visit: Payer: Self-pay | Admitting: *Deleted

## 2020-10-26 DIAGNOSIS — E119 Type 2 diabetes mellitus without complications: Secondary | ICD-10-CM

## 2020-10-26 MED ORDER — METFORMIN HCL ER 500 MG PO TB24
ORAL_TABLET | ORAL | 0 refills | Status: DC
Start: 2020-10-26 — End: 2021-01-27

## 2020-10-26 MED ORDER — METFORMIN HCL ER 500 MG PO TB24: ORAL_TABLET | ORAL | 3 refills | Status: DC

## 2020-11-20 ENCOUNTER — Encounter: Payer: Self-pay | Admitting: Adult Health

## 2020-11-20 DIAGNOSIS — I2510 Atherosclerotic heart disease of native coronary artery without angina pectoris: Secondary | ICD-10-CM

## 2020-11-20 HISTORY — DX: Atherosclerotic heart disease of native coronary artery without angina pectoris: I25.10

## 2020-11-20 NOTE — Progress Notes (Signed)
FOLLOW UP  Assessment and Plan:   Atherosclerosis of aorta Control blood pressure, cholesterol, glucose, increase exercise.   2 vessel CAD Plan to refer to cardiology vs CT coronary calcium at some point to determine degree; cannot determine this on non-cardiac imaging Denies sx; has numerous specialist appointments and CTs this year Will hold off, newly on rosuvastatin, continue ASA, aggressive lifestyle interventions Go to the ER if any chest pain, shortness of breath, nausea, dizziness, severe HA, changes vision/speech  Hypertension Well controlled with current medications  Monitor blood pressure at home; patient to call if consistently greater than 130/80 Continue DASH diet.   Reminder to go to the ER if any CP, SOB, nausea, dizziness, severe HA, changes vision/speech, left arm numbness and tingling and jaw pain.  Cholesterol Currently above goal of LDL <70; newly on rosvuastatin 5 mg three days a week  Continue low cholesterol diet and exercise.  Check lipid panel.   Diabetes with diabetic chronic kidney disease Continue medication: metformin  Continue diet and exercise.  Perform daily foot/skin check, notify office of any concerning changes.  Check A1C  BMI 26 Long discussion about weight loss, diet, and exercise Recommended diet heavy in fruits and veggies and low in animal meats, cheeses, and dairy products, appropriate calorie intake Discussed ideal weight for height  Continue current efforts with lifestyle modification Will follow up in 3 months  Vitamin D Def continue supplementation to maintain goal of 70-100 Check Vit D   Continue diet and meds as discussed. Further disposition pending results of labs. Discussed med's effects and SE's.   Over 30 minutes of exam, counseling, chart review, and critical decision making was performed.   Future Appointments  Date Time Provider [place]  12/04/2020  9:10 AM Gatha Mayer, MD [facility]   08/04/2021  9:00 AM Garnet Sierras, NP [facility] None    ----------------------------------------------------------------------------------------------------------------------  HPI 50 y.o. male  presents for overdue 3 month follow up on hypertension, cholesterol, diabetes, weight and vitamin D deficiency.   Recurrent covid 19 pneumonia, had significant pneumonia, respiratory failure with admission with residual fibrosis, slowly improving with increased walking.   He had fatty liver with elevated LFTs, recent External CT abd/pelvis w & w/o contrast 09/28/2020 showed prominent caudate lob and slight lobularity of the lateral segment left hepatic lob suspicious for early cirrhosis. No current significant hepatic steatosis. No liver mass - also known stable pancreatic cyst; he was referred to Dr. Carlean Purl and has appointment scheduled next week.   BMI is Body mass index is 26.58 kg/m., he has been working on diet and exercise. Reports has cut out sugar soda, does some diet (2/day), doing lots of [place] wraps, eggs, chicken salad with low carb chips and fruit, will do atkins bars while at work.   Wt Readings from Last 3 Encounters:  11/23/20 207 lb (93.9 kg)  08/04/20 203 lb (92.1 kg)  06/24/20 207 lb 3.2 oz (94 kg)   His blood pressure has been controlled at home (118/70s, etc), today their BP is BP: 118/78 He does not workout but work physically active job. He denies chest pain, shortness of breath, dizziness.  CT 05/2020 showed aortic atherosclerosis, 2 vessel CAD, was initiated on rosuvastatin.    He is not on cholesterol medication (was prescribed rosuvastatin 5 mg, newly taking 3 days a week) and denies myalgias. His cholesterol is not at goal. The cholesterol last visit was:   Lab Results  Component Value Date   CHOL 170 08/04/2020  HDL 36 (L) 08/04/2020   LDLCALC 109 (H) 08/04/2020   TRIG 136 08/04/2020   CHOLHDL 4.7 08/04/2020    He has been working on diet and exercise for T2  diabetes on metformin (2000 mg), and denies foot ulcerations, increased appetite, nausea, paresthesia of the feet, polydipsia, polyuria, visual disturbances, vomiting and weight loss. He reports has upcoming diabetic eye visit scheduled, [facility] [place] He checks fasting glucose intermittently 70s-119 Last A1C in the office was:  Lab Results  Component Value Date   HGBA1C 5.6 08/04/2020   CKD II associated with T2DM monitored at this office, on lisinopril. Last GFR:  Lab Results  Component Value Date   GFRNONAA 87 08/04/2020   Patient is on Vitamin D supplement and below goal at last check:    Lab Results  Component Value Date   VD25OH 37 07/30/2019        Current Medications:  Current Outpatient Medications on File Prior to Visit  Medication Sig  . albuterol (VENTOLIN HFA) 108 (90 Base) MCG/ACT inhaler Inhale 2 puffs into the lungs every 4 (four) hours as needed for wheezing or shortness of breath.  . APPLE CIDER VINEGAR PO Take 2 capsules by mouth daily.   . Armodafinil 150 MG tablet Take 1 tablet (150 mg total) by mouth daily.  Marland Kitchen ascorbic acid (VITAMIN C) 500 MG tablet Take 500-1,000 mg by mouth daily.  Marland Kitchen aspirin EC 81 MG tablet Take 81 mg by mouth in the morning. Swallow whole.  . Blood Glucose Monitoring Suppl (CONTOUR NEXT MONITOR) w/Device KIT Check blood sugar 3 times daily-E11.9.  Marland Kitchen Cholecalciferol (D3-50) 1.25 MG (50000 UT) capsule Take 1 tablet 3 x /week on Mon Wed & Fri for Severe Vitamin D Deficiency (Patient taking differently: Take 50,000 Units by mouth every 3 (three) days.)  . Cinnamon 500 MG capsule Take 500 mg by mouth 2 (two) times daily.   . fexofenadine (ALLEGRA) 180 MG tablet Take     1 tablet      Daily        for Allergies  . glucose blood (CONTOUR NEXT TEST) test strip Check blood sugar 3 times daily-E11.9  . Lancets Micro Thin 33G MISC Check blood sugar 3 times daily-DX-E11.9  . metFORMIN (GLUCOPHAGE-XR) 500 MG 24 hr tablet Take 2 tabs twice daily  with meals for diabetes.  . traZODone (DESYREL) 100 MG tablet TAKE 1 TABLET BY MOUTH EVERYDAY AT BEDTIME  . VITAMIN E PO Take 1 capsule by mouth daily.  Marland Kitchen zinc gluconate 50 MG tablet Take 200 mg by mouth daily.   . Cyanocobalamin (VITAMIN B-12) 5000 MCG SUBL Place 5,000 mcg under the tongue daily. (Patient not taking: Reported on 11/23/2020)  . dexamethasone (DECADRON) 1 MG tablet Take 3 tabs for 3 days, 2 tabs for 3 days 1 tab for 5 days. Take with food.  . doxycycline (VIBRAMYCIN) 100 MG capsule Take 1 capsule twice daily with food   No current facility-administered medications on file prior to visit.     Allergies: No Known Allergies   Medical History:  Past Medical History:  Diagnosis Date  . 2-vessel coronary artery disease 11/20/2020  . Acute respiratory failure with hypoxia ([place])   . Chronic kidney disease   . Diabetes mellitus without complication ([place]) 7673  . Fatty liver   . Hypertension 2012  . Pneumonia due to COVID-19 virus 04/24/2020  . Prostatitis 02/08/2018   Family history- Reviewed and unchanged Social history- Reviewed and unchanged  Review of Systems:  Review of Systems  Constitutional: Negative for malaise/fatigue and weight loss.  HENT: Negative for hearing loss and tinnitus.   Eyes: Negative for blurred vision and double vision.  Respiratory: Negative for cough, shortness of breath and wheezing.   Cardiovascular: Negative for chest pain, palpitations, orthopnea, claudication and leg swelling.  Gastrointestinal: Negative for abdominal pain, blood in stool, constipation, diarrhea, heartburn, melena, nausea and vomiting.  Genitourinary: Negative.   Musculoskeletal: Negative for joint pain and myalgias.  Skin: Negative for rash.  Neurological: Negative for dizziness, tingling, sensory change, weakness and headaches.  Endo/Heme/Allergies: Negative for polydipsia.  Psychiatric/Behavioral: Negative.   All other systems reviewed and are negative.   Physical  Exam: BP 118/78   Pulse 85   Temp (!) 97.3 F (36.3 C)   Wt 207 lb (93.9 kg)   SpO2 97%   BMI 26.58 kg/m  Wt Readings from Last 3 Encounters:  11/23/20 207 lb (93.9 kg)  08/04/20 203 lb (92.1 kg)  06/24/20 207 lb 3.2 oz (94 kg)   General Appearance: Well nourished adults male, appears to be feeling unwell but in no apparent distress. Eyes: PERRLA, conjunctiva no swelling or erythema Sinuses: No Frontal/maxillary tenderness ENT/Mouth: Ext aud canals clear, TMs without erythema, bulging. Post pharynx without swelling, or exudate on post pharynx.  Tonsils not swollen or erythematous. Hearing normal.  Neck: Supple Respiratory: Respiratory effort normal, BS equal bilaterally without rales, rhonchi, wheezing or stridor.  Cardio: RRR with no MRGs. Brisk peripheral pulses without edema.  Abdomen: Soft, + BS.  Non tender, no rebound, no palpable organomegaly.  Lymphatics: Non tender without lymphadenopathy.  Musculoskeletal: Full ROM, 5/5 strength, Normal gait Skin: Warm, dry, no lesions, ecchymosis, rash.  Neuro: Cranial nerves intact. No cerebellar symptoms. Distal sensation intact.  Psych: Awake and oriented X 3, normal affect, Insight and Judgment appropriate.    Izora Ribas, NP 11:58 AM [facility]

## 2020-11-23 ENCOUNTER — Encounter: Payer: Self-pay | Admitting: Adult Health

## 2020-11-23 ENCOUNTER — Other Ambulatory Visit: Payer: Self-pay

## 2020-11-23 ENCOUNTER — Ambulatory Visit (INDEPENDENT_AMBULATORY_CARE_PROVIDER_SITE_OTHER): Payer: 59 | Admitting: Adult Health

## 2020-11-23 VITALS — BP 118/78 | HR 85 | Temp 97.3°F | Wt 207.0 lb

## 2020-11-23 DIAGNOSIS — I10 Essential (primary) hypertension: Secondary | ICD-10-CM | POA: Diagnosis not present

## 2020-11-23 DIAGNOSIS — Z20822 Contact with and (suspected) exposure to covid-19: Secondary | ICD-10-CM

## 2020-11-23 DIAGNOSIS — E1122 Type 2 diabetes mellitus with diabetic chronic kidney disease: Secondary | ICD-10-CM

## 2020-11-23 DIAGNOSIS — E559 Vitamin D deficiency, unspecified: Secondary | ICD-10-CM

## 2020-11-23 DIAGNOSIS — E1169 Type 2 diabetes mellitus with other specified complication: Secondary | ICD-10-CM

## 2020-11-23 DIAGNOSIS — E785 Hyperlipidemia, unspecified: Secondary | ICD-10-CM

## 2020-11-23 DIAGNOSIS — N182 Chronic kidney disease, stage 2 (mild): Secondary | ICD-10-CM

## 2020-11-23 DIAGNOSIS — I700 Atherosclerosis of aorta: Secondary | ICD-10-CM | POA: Diagnosis not present

## 2020-11-23 DIAGNOSIS — I2510 Atherosclerotic heart disease of native coronary artery without angina pectoris: Secondary | ICD-10-CM

## 2020-11-23 DIAGNOSIS — E663 Overweight: Secondary | ICD-10-CM

## 2020-11-23 MED ORDER — LISINOPRIL 5 MG PO TABS: ORAL_TABLET | ORAL | 1 refills | Status: DC

## 2020-11-23 NOTE — Patient Instructions (Addendum)
Goals    . Exercise 150 min/wk Moderate Activity     Aim to break a sweat and get heart rate up for at least 15-20 min daily. Circuit training with weights can be helpful.     Marland Kitchen HEMOGLOBIN A1C < 5.7    . LDL CALC < 70    . Weight (lb) < 200 lb (90.7 kg)         Please monitor to see if having muscle aches on days taking rosuvastatin; try stopping for 2 weeks and restarting if unsure. If correlates with med, could try lower frequency - side effects are typically dose dependent if mild.    Continue to read labels for low total carb, relatively high fiber, high protein, low saturated fat  Beans, seeds are excellent options -   Beans are excellent for both diabetes, weight management, reduce GI cancers, general longevity - some of the healthiest foods on the planet   A great goal to work towards is aiming to get in a serving daily of some of the most nutritionally dense foods - G- BOMBS daily        Antiinflammatory diet  Doctors are learning that one of the best ways to reduce inflammation lies not in the medicine cabinet, but in the refrigerator.  By following an anti-inflammatory diet you can fight off inflammation for good.  What does an anti-inflammatory diet do? Your immune system becomes activated when your body recognizes anything that is foreign--such as an invading microbe, plant pollen, or chemical. This often triggers a process called inflammation. Intermittent bouts of inflammation directed at truly threatening invaders protect your health.  However, sometimes inflammation persists, day in and day out, even when you are not threatened by a foreign invader. That's when inflammation can become your enemy. Many major diseases that plague us--including cancer, heart disease, diabetes, arthritis, depression, and Alzheimer's--have been linked to chronic inflammation.  One of the most powerful tools to combat inflammation comes not from the pharmacy, but from the grocery store.    Protect yourself from the damage of chronic inflammation. Science has proven that chronic, low-grade inflammation can turn into a silent killer that contributes to cardiovascular disease, cancer, type 2 diabetes and other conditions.   Choose the right anti-inflammatory foods, and you may be able to reduce your risk of illness. Consistently pick the wrong ones, and you could accelerate the inflammatory disease process.     Foods that cause inflammation Try to avoid or limit these foods as much as possible:   refined carbohydrates, such as white bread and pastries  [place] fries and other fried foods  soda and other sugar-sweetened beverages  red meat (burgers, steaks) and processed meat (hot dogs, sausage)  margarine, shortening, and lard  The health risks of inflammatory foods Not surprisingly, the same foods on an inflammation diet are generally considered bad for our health, including sodas and refined carbohydrates, as well as red meat and processed meats.  "Some of the foods that have been associated with an increased risk for chronic diseases such as type 2 diabetes and heart disease are also associated with excess inflammation," Dr. Melanee Spry says. "It's not surprising, since inflammation is an important underlying mechanism for the development of these diseases."  Unhealthy foods also contribute to weight gain, which is itself a risk factor for inflammation. Yet in several studies, even after researchers took obesity into account, the link between foods and inflammation remained, which suggests weight gain isn't the sole driver. "Some  of the food components or ingredients may have independent effects on inflammation over and above increased caloric intake," Dr. Melanee Spry says.  Anti-inflammatory foods An anti-inflammatory diet should include these foods:   tomatoes  olive oil  green leafy vegetables, such as spinach, kale, and collards  nuts like almonds and walnuts  fatty fish  like salmon, mackerel, tuna, and sardines  fruits such as strawberries, blueberries, cherries, and oranges  Benefits of anti-inflammatory foods On the flip side are beverages and foods that reduce inflammation, in particular fruits and vegetables such as blueberries, apples, and leafy greens that are high in natural antioxidants and polyphenols--protective compounds found in plants.  Studies have also associated nuts with reduced markers of inflammation and a lower risk of cardiovascular disease and diabetes. Coffee, which contains polyphenols and other anti-inflammatory compounds, may protect against inflammation, as well.  Anti-inflammatory diet To reduce levels of inflammation, aim for an overall healthy diet. If you're looking for an eating plan that closely follows the tenets of anti-inflammatory eating, consider the Mediterranean diet, which is high in fruits, vegetables, nuts, whole grains, fish, and healthy oils.  In addition to lowering inflammation, a more natural, less processed diet can have noticeable effects on your physical and emotional health and overall quality of life.   [email or web address].fi

## 2020-11-24 ENCOUNTER — Other Ambulatory Visit: Payer: Self-pay | Admitting: Adult Health

## 2020-11-24 DIAGNOSIS — E559 Vitamin D deficiency, unspecified: Secondary | ICD-10-CM

## 2020-11-24 MED ORDER — D3-50 1.25 MG (50000 UT) PO CAPS: ORAL_CAPSULE | ORAL | 3 refills | Status: DC

## 2020-12-01 LAB — CBC WITH DIFFERENTIAL/PLATELET
Absolute Monocytes: 543 cells/uL (ref 200–950)
Basophils Absolute: 74 cells/uL (ref 0–200)
Basophils Relative: 0.8 %
Eosinophils Absolute: 120 cells/uL (ref 15–500)
Eosinophils Relative: 1.3 %
HCT: 45.1 % (ref 38.5–50.0)
Hemoglobin: 15.4 g/dL (ref 13.2–17.1)
Lymphs Abs: 2364 cells/uL (ref 850–3900)
MCH: 30 pg (ref 27.0–33.0)
MCHC: 34.1 g/dL (ref 32.0–36.0)
MCV: 87.7 fL (ref 80.0–100.0)
MPV: 11.4 fL (ref 7.5–12.5)
Monocytes Relative: 5.9 %
Neutro Abs: 6100 cells/uL (ref 1500–7800)
Neutrophils Relative %: 66.3 %
Platelets: 207 10*3/uL (ref 140–400)
RBC: 5.14 10*6/uL (ref 4.20–5.80)
RDW: 12.5 % (ref 11.0–15.0)
Total Lymphocyte: 25.7 %
WBC: 9.2 10*3/uL (ref 3.8–10.8)

## 2020-12-01 LAB — COMPLETE METABOLIC PANEL WITH GFR
AG Ratio: 2.1 (calc) (ref 1.0–2.5)
ALT: 22 U/L (ref 9–46)
AST: 19 U/L (ref 10–35)
Albumin: 4.6 g/dL (ref 3.6–5.1)
Alkaline phosphatase (APISO): 53 U/L (ref 35–144)
BUN: 15 mg/dL (ref 7–25)
CO2: 27 mmol/L (ref 20–32)
Calcium: 10.2 mg/dL (ref 8.6–10.3)
Chloride: 106 mmol/L (ref 98–110)
Creat: 1.03 mg/dL (ref 0.70–1.33)
GFR, Est African American: 98 mL/min/{1.73_m2} (ref >=60)
GFR, Est Non African American: 84 mL/min/{1.73_m2} (ref >=60)
Globulin: 2.2 g/dL (calc) (ref 1.9–3.7)
Glucose, Bld: 111 mg/dL — ABNORMAL HIGH (ref 65–99)
Potassium: 4.2 mmol/L (ref 3.5–5.3)
Sodium: 142 mmol/L (ref 135–146)
Total Bilirubin: 0.9 mg/dL (ref 0.2–1.2)
Total Protein: 6.8 g/dL (ref 6.1–8.1)

## 2020-12-01 LAB — HEMOGLOBIN A1C
Hgb A1c MFr Bld: 5.5 % of total Hgb (ref <5.7)
Mean Plasma Glucose: 111 mg/dL
eAG (mmol/L): 6.2 mmol/L

## 2020-12-01 LAB — LIPID PANEL
Cholesterol: 130 mg/dL (ref <200)
HDL: 40 mg/dL (ref >=40)
LDL Cholesterol (Calc): 71 mg/dL (calc)
Non-HDL Cholesterol (Calc): 90 mg/dL (calc) (ref <130)
Total CHOL/HDL Ratio: 3.3 (calc) (ref <5.0)
Triglycerides: 104 mg/dL (ref <150)

## 2020-12-01 LAB — MAGNESIUM: Magnesium: 1.8 mg/dL (ref 1.5–2.5)

## 2020-12-01 LAB — TSH: TSH: 1.71 mIU/L (ref 0.40–4.50)

## 2020-12-01 LAB — SARS-COV-2 SEMI-QUANTITATIVE TOTAL ANTIBODY, SPIKE: SARS COV2 AB, Total Spike Semi QN: >2500 U/mL — ABNORMAL HIGH (ref <0.8)

## 2020-12-01 LAB — VITAMIN D 25 HYDROXY (VIT D DEFICIENCY, FRACTURES): Vit D, 25-Hydroxy: >150 ng/mL — ABNORMAL HIGH (ref 30–100)

## 2020-12-04 ENCOUNTER — Other Ambulatory Visit: Payer: Self-pay

## 2020-12-04 ENCOUNTER — Encounter: Payer: Self-pay | Admitting: Internal Medicine

## 2020-12-04 ENCOUNTER — Ambulatory Visit (INDEPENDENT_AMBULATORY_CARE_PROVIDER_SITE_OTHER): Payer: 59 | Admitting: Internal Medicine

## 2020-12-04 VITALS — BP 120/70 | HR 84 | Ht 73.25 in | Wt 205.2 lb

## 2020-12-04 DIAGNOSIS — R932 Abnormal findings on diagnostic imaging of liver and biliary tract: Secondary | ICD-10-CM

## 2020-12-04 DIAGNOSIS — K760 Fatty (change of) liver, not elsewhere classified: Secondary | ICD-10-CM

## 2020-12-04 DIAGNOSIS — K869 Disease of pancreas, unspecified: Secondary | ICD-10-CM | POA: Diagnosis not present

## 2020-12-04 DIAGNOSIS — Z1211 Encounter for screening for malignant neoplasm of colon: Secondary | ICD-10-CM

## 2020-12-04 DIAGNOSIS — E8881 Metabolic syndrome: Secondary | ICD-10-CM | POA: Diagnosis not present

## 2020-12-04 NOTE — Progress Notes (Signed)
Gregory Jacobs 49 y.o. [birth year] [id number] Referred by: Gregory Pinto, MD Gregory Comber, NP Assessment & Plan:   Encounter Diagnoses  Name Primary?  Marland Kitchen NAFLD (nonalcoholic fatty liver disease) Yes  . Abnormal liver CT ? cirrhosis   . Pancreatic lesion diminutive hypodensity and stable x years   . Metabolic syndrome   . 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  He has lost weight due to Covid and that has improved his metabolic numbers.  He has made significant strides to try to keep the weight off.  We have discussed the need to modify diet and I have given him recommendations as outlined below.  He does need aggressive metabolic syndrome lifestyle modifications and is on the way.  If he does have cirrhosis and I do suspect that, it is well compensated and he does not meet criteria to screen for varices.  Hopefully this can stabilize and he can keep good function of the liver.  Consider additional imaging to prove whether or not he has cirrhosis though I am not sure is going to change management at this point.  That could include liver Doppler exam MRI etc. most likely weight 6 months to repeat something.  I do not think we need a liver biopsy.  He had a thorough serologic work-up last year when he had fatty liver and hepatomegaly.  Elevated ferritin in August when admitted with COVID certainly an acute phase reactant.  His iron saturation was only 31% in November.  Question contribution of his severe critical illness with COVID regarding acceleration of liver problems.  Just the theory but I wonder.  Screening colonoscopy is appropriate  The risks and benefits as well as alternatives of endoscopic procedure(s) have been discussed and reviewed. All questions answered. The patient agrees to proceed.  The pancreatic lesion is tiny and stable seen throughout the  years question a little cyst or something I do not think it needs follow-up.  However we will be obtaining imaging follow-up of the liver and this will be seen undoubtedly.  Lab evaluation as below to look for immunity to hepatitis a and B and consider vaccination, apolipoprotein B to check his metabolic status further. Orders Placed This Encounter  Procedures  . Apolipoprotein B  . Hepatitis A Ab, Total  . Hepatitis B Surface AntiBODY    Weight loss guidance education as below.  When he returns suggest real apple cider vinegar rather than Gummies or tablets.  I have asked him to work on eliminating sugar-free soda as well though he has made great steps.   Read The Obesity Code by Dr. Sharman Cheek or Fast, Feat, Repeat by Kizzie Furnish and implement  suggestions. Investigate and sign up for the www.dietdoctor.com website if desired and utilize those resources. Checkout Dr. Luis Abed on YouTube You can also look up Dr. Enrigue Catena on the Internet and YouTube I have provided handouts on insulin resistance, restricted feeding/intermittent fasting, and proper food choices to lower and eliminate insulin resistance and lose weight. Have a long-term approach to this and do not expect rapid results but have a 1 to 2-year timeframe to change her eating and to become fat adapted.  I appreciate the opportunity to care for this patient. Send copy to Gregory Comber, NP Subjective:   Chief Complaint: Cirrhosis on CT scan, colon cancer screening  HPI Gregory Jacobs is a 50 year old white man  who had CT imaging raising question of liver cirrhosis in late 2021.  He has had fatty liver for many years, I had seen him in 2011 he had abnormal transaminases and weight loss was recommended that he was eating a lot of fast food and he actually did lose some weight and did okay for a while.  In 2020 he had hepatomegaly and abnormal transaminases and had a thorough evaluation through primary care.  He does not have evidence of  hepatitis C B or A.  Autoimmune markers with ANA mitochondrial antibodies celiac panel all negative.  Ferritin was really high in August when he was sick with Covid his iron saturation was 31% in November.  During and after Covid he lost substantial weight and he is no longer on diabetic medication.  He is working to eliminate sugar and he drinks sodas but has 1 maybe 2 small diet sodas a day.  He is trying to reduce carbohydrates some he is eating more frequent smaller meals.  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    Wt Readings from Last 3 Encounters:  12/04/20 205 lb 4 oz (93.1 kg)  11/23/20 207 lb (93.9 kg)  08/04/20 203 lb (92.1 kg)   224 pounds April 2021  Ultrasound in August when he was admitted with COVID as below   IMPRESSION: Hepatomegaly. Increased liver echogenicity likely reflects steatosis.   CT of the chest without contrast 06/12/2020  IMPRESSION: 1. Dense fibrous bands of tissue in the periphery the lungs, consistent with scarring. There is some mild ground-glass opacity associated with these bands of scarring likely representing resolving inflammation. 2. Coronary artery disease. 3. Previous cholecystectomy. 4. Aortic Atherosclerosis (ICD10-I70.0).  Due to hematuria he was seen at [facility]rology and he had CT of the abdomen pelvis with and without contrast media and pertinent findings regarding the liver as below: Images viewed and reviewed with the patient January 2022  Prominent caudate lobe and slight lobularity of lateral segment left lobe of liver suggestive of early cirrhosis  CT of the chest January 2022-images viewed  Upper Abdomen: Liver has a slightly shrunken appearance and nodular contour, suggesting early changes of cirrhosis. No Known Allergies Current Meds  Medication Sig  . albuterol (VENTOLIN HFA) 108 (90 Base) MCG/ACT inhaler Inhale 2 puffs into the lungs every 4 (four) hours as  needed for wheezing or shortness of breath.  . APPLE CIDER VINEGAR PO Take 2 capsules by mouth daily.   . Armodafinil 150 MG tablet Take 1 tablet (150 mg total) by mouth daily.  Marland Kitchen ascorbic acid (VITAMIN C) 500 MG tablet Take 500-1,000 mg by mouth daily.  Marland Kitchen aspirin EC 81 MG tablet Take 81 mg by mouth in the morning. Swallow whole.  . Blood Glucose Monitoring Suppl (CONTOUR NEXT MONITOR) w/Device KIT Check blood sugar 3 times daily-E11.9.  Marland Kitchen Cholecalciferol (D3-50) 1.25 MG (50000 UT) capsule Take 1 tablet once weekly for Severe Vitamin D Deficiency  . Cinnamon 500 MG capsule Take 500 mg by mouth 2 (two) times daily.   . Cyanocobalamin (VITAMIN B-12) 5000 MCG SUBL Place 5,000 mcg under the tongue daily.  . fexofenadine (ALLEGRA) 180 MG tablet Take     1 tablet      Daily        for Allergies  . glucose blood (CONTOUR NEXT TEST) test strip Check blood sugar 3 times daily-E11.9  . Lancets Micro Thin 33G MISC Check blood  sugar 3 times daily-DX-E11.9  . lisinopril (ZESTRIL) 5 MG tablet TAKE 1 TABLET DAILY FOR BLOOD PRESSURE  . metFORMIN (GLUCOPHAGE-XR) 500 MG 24 hr tablet Take 2 tabs twice daily with meals for diabetes.  . traZODone (DESYREL) 100 MG tablet TAKE 1 TABLET BY MOUTH EVERYDAY AT BEDTIME  . VITAMIN E PO Take 1 capsule by mouth daily.  Marland Kitchen zinc gluconate 50 MG tablet Take 200 mg by mouth daily.    Past Medical History:  Diagnosis Date  . 2-vessel coronary artery disease 11/20/2020  . Acute respiratory failure with hypoxia ([place])   . Chronic kidney disease   . COVID-19   . Diabetes mellitus without complication ([place]) 1610  . Fatty liver   . Hypertension 2012  . Pancreatitis   . Pneumonia due to COVID-19 virus 04/24/2020  . Prostatitis 02/08/2018   Past Surgical History:  Procedure Laterality Date  . CHOLECYSTECTOMY, LAPAROSCOPIC  2011  . VASECTOMY  2016   Social History   Social History Narrative   Married, wife is a Technical brewer at [place] [employer]\   Third  shift truck driver for [employer]   1 son   1 or 2 caffeinated beverages a day no alcohol no tobacco no drug use former smoker   family history includes Breast cancer in his mother; Breast cancer (age of onset: 34) in his maternal aunt; Diabetes in his maternal aunt and paternal uncle; Heart attack (age of onset: 54) in his father; Hypertension in his father; Suicidality (age of onset: 28) in his sister.   Review of Systems As per HPI still has fatigue and some dyspnea followed by Dr. Vaughan Browner allergy problems headaches sore throat all other review of systems negative  Objective:   Physical Exam _0  120/70 (BP Location: Left Arm, Patient Position: Sitting, Cuff Size: Normal)   Pulse 84   Ht 6' 1.25" (1.861 m) Comment: height measured without shoes  Wt 205 lb 4 oz (93.1 kg)   BMI 26.89 kg/m @  General:  Well-developed, well-nourished and in no acute distress Eyes:  anicteric. ENT:   Mouth and posterior pharynx free of lesions.  Neck:   supple w/o thyromegaly or mass.  Lungs: Clear to auscultation bilaterally. Heart:  S1S2, no rubs, murmurs, gallops. Abdomen:  soft, non-tender, no hepatosplenomegaly, hernia, or mass and BS+.  Rectal: deferred Lymph:  no cervical or supraclavicular adenopathy. Extremities:   no edema, cyanosis or clubbing Skin   no rash. No stigmata CLD Neuro:  A&O x 3.  Psych:  appropriate mood and  Affect.   Data Reviewed: See HPI

## 2020-12-04 NOTE — Patient Instructions (Signed)
You have been scheduled for a colonoscopy. Please follow written instructions given to you at your visit today.  Please pick up your prep supplies at the pharmacy within the next 1-3 days. If you use inhalers (even only as needed), please bring them with you on the day of your procedure.  Due to recent changes in healthcare laws, you may see the results of your imaging and laboratory studies on MyChart before your provider has had a chance to review them.  We understand that in some cases there may be results that are confusing or concerning to you. Not all laboratory results come back in the same time frame and the provider may be waiting for multiple results in order to interpret others.  Please give [place] 48 hours in order for your provider to thoroughly review all the results before contacting the office for clarification of your results.   Please come back FASTING for lab work. The lab is located in the basement and is open Monday thru Friday 7:30AM-5:30PM, no appointment needed.   We are providing you with diet information for you to read and follow.   I appreciate the opportunity to care for you. Silvano Rusk, MD, [facility]

## 2020-12-10 ENCOUNTER — Other Ambulatory Visit: Payer: Self-pay | Admitting: Internal Medicine

## 2020-12-14 ENCOUNTER — Other Ambulatory Visit: Payer: 59

## 2020-12-14 DIAGNOSIS — K760 Fatty (change of) liver, not elsewhere classified: Secondary | ICD-10-CM

## 2020-12-14 DIAGNOSIS — K869 Disease of pancreas, unspecified: Secondary | ICD-10-CM

## 2020-12-14 DIAGNOSIS — E8881 Metabolic syndrome: Secondary | ICD-10-CM

## 2020-12-14 DIAGNOSIS — R932 Abnormal findings on diagnostic imaging of liver and biliary tract: Secondary | ICD-10-CM

## 2020-12-15 LAB — HEPATITIS B SURFACE ANTIBODY,QUALITATIVE: Hep B S Ab: NONREACTIVE

## 2020-12-15 LAB — APOLIPOPROTEIN B: Apolipoprotein B: 72 mg/dL (ref <90)

## 2020-12-15 LAB — HEPATITIS A ANTIBODY, TOTAL: Hepatitis A AB,Total: NONREACTIVE

## 2020-12-22 ENCOUNTER — Other Ambulatory Visit: Payer: Self-pay

## 2020-12-22 DIAGNOSIS — Z23 Encounter for immunization: Secondary | ICD-10-CM

## 2020-12-28 ENCOUNTER — Ambulatory Visit (INDEPENDENT_AMBULATORY_CARE_PROVIDER_SITE_OTHER): Payer: 59 | Admitting: Internal Medicine

## 2020-12-28 DIAGNOSIS — Z23 Encounter for immunization: Secondary | ICD-10-CM

## 2021-01-06 ENCOUNTER — Other Ambulatory Visit: Payer: Self-pay | Admitting: Adult Health

## 2021-01-18 ENCOUNTER — Encounter: Payer: Self-pay | Admitting: Internal Medicine

## 2021-01-18 ENCOUNTER — Ambulatory Visit (AMBULATORY_SURGERY_CENTER): Payer: 59 | Admitting: Internal Medicine

## 2021-01-18 ENCOUNTER — Other Ambulatory Visit: Payer: Self-pay

## 2021-01-18 VITALS — BP 125/75 | HR 61 | Temp 98.6°F | Resp 14 | Ht 73.0 in | Wt 205.0 lb

## 2021-01-18 DIAGNOSIS — Z1211 Encounter for screening for malignant neoplasm of colon: Secondary | ICD-10-CM

## 2021-01-18 MED ORDER — SODIUM CHLORIDE 0.9 % IV SOLN: 500.0000 mL | Freq: Once | INTRAVENOUS | Status: DC

## 2021-01-18 NOTE — Progress Notes (Signed)
PT taken to PACU. Monitors in place. VSS. Report given to RN. 

## 2021-01-18 NOTE — Patient Instructions (Addendum)
No polyps or cancer seen.  Please keep up the good work of keeping weight off.   Make a note in your phone calendar to call in August to get an October appointment.  I appreciate the opportunity to care for you. Gatha Mayer, MD, [facility]  Resume previous diet. Continue present medications.  Repeat colonoscopy in 10 years screening purposes.  YOU HAD AN ENDOSCOPIC PROCEDURE TODAY AT [facility]:   Refer to the procedure report that was given to you for any specific questions about what was found during the examination.  If the procedure report does not answer your questions, please call your gastroenterologist to clarify.  If you requested that your care partner not be given the details of your procedure findings, then the procedure report has been included in a sealed envelope for you to review at your convenience later.  YOU SHOULD EXPECT: Some feelings of bloating in the abdomen. Passage of more gas than usual.  Walking can help get rid of the air that was put into your GI tract during the procedure and reduce the bloating. If you had a lower endoscopy (such as a colonoscopy or flexible sigmoidoscopy) you may notice spotting of blood in your stool or on the toilet paper. If you underwent a bowel prep for your procedure, you may not have a normal bowel movement for a few days.  Please Note:  You might notice some irritation and congestion in your nose or some drainage.  This is from the oxygen used during your procedure.  There is no need for concern and it should clear up in a day or so.  SYMPTOMS TO REPORT IMMEDIATELY:   Following lower endoscopy (colonoscopy or flexible sigmoidoscopy):  Excessive amounts of blood in the stool  Significant tenderness or worsening of abdominal pains  Swelling of the abdomen that is new, acute  Fever of 100F or higher    For urgent or emergent issues, a gastroenterologist can be reached at any hour by calling [phone]. Do not  use MyChart messaging for urgent concerns.    DIET:  We do recommend a small meal at first, but then you may proceed to your regular diet.  Drink plenty of fluids but you should avoid alcoholic beverages for 24 hours.  ACTIVITY:  You should plan to take it easy for the rest of today and you should NOT DRIVE or use heavy machinery until tomorrow (because of the sedation medicines used during the test).    FOLLOW UP: Our staff will call the number listed on your records 48-72 hours following your procedure to check on you and address any questions or concerns that you may have regarding the information given to you following your procedure. If we do not reach you, we will leave a message.  We will attempt to reach you two times.  During this call, we will ask if you have developed any symptoms of COVID 19. If you develop any symptoms (ie: fever, flu-like symptoms, shortness of breath, cough etc.) before then, please call [phone].  If you test positive for Covid 19 in the 2 weeks post procedure, please call and report this information to [place].    If any biopsies were taken you will be contacted by phone or by letter within the next 1-3 weeks.  Please call us at [phone] if you have not heard about the biopsies in 3 weeks.    SIGNATURES/CONFIDENTIALITY: You and/or your care partner have signed paperwork which  will be entered into your electronic medical record.  These signatures attest to the fact that that the information above on your After Visit Summary has been reviewed and is understood.  Full responsibility of the confidentiality of this discharge information lies with you and/or your care-partner.

## 2021-01-18 NOTE — Op Note (Signed)
[facility] Patient Name: Gregory Jacobs Procedure Date: 01/18/2021 3:40 PM MRN: [id number] Endoscopist: Gatha Mayer , MD Age: 50 Referring MD:  Date of Birth: 11-14-1970 Gender: Male Account #: [id number] Procedure:                Colonoscopy Indications:              Screening for colorectal malignant neoplasm, This                            is the patient's first colonoscopy Medicines:                Propofol per Anesthesia, Monitored Anesthesia Care Procedure:                Pre-Anesthesia Assessment:                           - Prior to the procedure, a History and Physical                            was performed, and patient medications and                            allergies were reviewed. The patient's tolerance of                            previous anesthesia was also reviewed. The risks                            and benefits of the procedure and the sedation                            options and risks were discussed with the patient.                            All questions were answered, and informed consent                            was obtained. Prior Anticoagulants: The patient has                            taken no previous anticoagulant or antiplatelet                            agents. ASA Grade Assessment: II - A patient with                            mild systemic disease. After reviewing the risks                            and benefits, the patient was deemed in                            satisfactory condition to undergo the procedure.  After obtaining informed consent, the colonoscope                            was passed under direct vision. Throughout the                            procedure, the patient's blood pressure, pulse, and                            oxygen saturations were monitored continuously. The                            Olympus CF-HQ190 [id number]) Colonoscope was                             introduced through the anus and advanced to the the                            cecum, identified by appendiceal orifice and                            ileocecal valve. The colonoscopy was performed                            without difficulty. The patient tolerated the                            procedure well. The quality of the bowel                            preparation was good. The ileocecal valve,                            appendiceal orifice, and rectum were photographed. Scope In: 3:59:19 PM Scope Out: 4:09:49 PM Scope Withdrawal Time: 0 hours 7 minutes 38 seconds  Total Procedure Duration: 0 hours 10 minutes 30 seconds  Findings:                 The perianal and digital rectal examinations were                            normal.                           The colon (entire examined portion) appeared normal.                           Anal papilla(e) were hypertrophied.                           The exam was otherwise without abnormality on                            direct and retroflexion views. Complications:            No immediate complications. Estimated Blood Loss:  Estimated blood loss: none. Impression:               - The entire examined colon is normal.                           - Anal papilla(e) were hypertrophied.                           - The examination was otherwise normal on direct                            and retroflexion views.                           - No specimens collected. Recommendation:           - Patient has a contact number available for                            emergencies. The signs and symptoms of potential                            delayed complications were discussed with the                            patient. Return to normal activities tomorrow.                            Written discharge instructions were provided to the                            patient.                           - Resume previous diet.                            - Continue present medications.                           - Repeat colonoscopy in 10 years for screening                            purposes. Gatha Mayer, MD 01/18/2021 4:14:59 PM This report has been signed electronically.

## 2021-01-20 ENCOUNTER — Telehealth: Payer: Self-pay | Admitting: *Deleted

## 2021-01-20 NOTE — Telephone Encounter (Signed)
  Follow up Call-  Call back number 01/18/2021  Post procedure Call Back phone  # [phone]  Permission to leave phone message Yes  Some recent data might be hidden     Patient questions:  Do you have a fever, pain , or abdominal swelling? No. Pain Score  0 *  Have you tolerated food without any problems? Yes.    Have you been able to return to your normal activities? Yes.    Do you have any questions about your discharge instructions: Diet   No. Medications  No. Follow up visit  No.  Do you have questions or concerns about your Care? No.  Actions: * If pain score is 4 or above: No action needed, pain <4.  1. Have you developed a fever since your procedure? no  2.   Have you had an respiratory symptoms (SOB or cough) since your procedure? no  3.   Have you tested positive for COVID 19 since your procedure no  4.   Have you had any family members/close contacts diagnosed with the COVID 19 since your procedure?  no   If yes to any of these questions please route to Joylene John, RN and Joella Prince, RN

## 2021-01-21 ENCOUNTER — Encounter (HOSPITAL_BASED_OUTPATIENT_CLINIC_OR_DEPARTMENT_OTHER): Payer: Self-pay | Admitting: Emergency Medicine

## 2021-01-21 ENCOUNTER — Other Ambulatory Visit: Payer: Self-pay

## 2021-01-21 ENCOUNTER — Emergency Department (HOSPITAL_BASED_OUTPATIENT_CLINIC_OR_DEPARTMENT_OTHER)
Admission: EM | Admit: 2021-01-21 | Discharge: 2021-01-21 | Disposition: A | Discharge status: Home / Self Care | Payer: No Typology Code available for payment source | Attending: Emergency Medicine | Admitting: Emergency Medicine

## 2021-01-21 ENCOUNTER — Emergency Department (HOSPITAL_BASED_OUTPATIENT_CLINIC_OR_DEPARTMENT_OTHER): Payer: No Typology Code available for payment source

## 2021-01-21 DIAGNOSIS — Z8616 Personal history of COVID-19: Secondary | ICD-10-CM | POA: Insufficient documentation

## 2021-01-21 DIAGNOSIS — Z79899 Other long term (current) drug therapy: Secondary | ICD-10-CM | POA: Insufficient documentation

## 2021-01-21 DIAGNOSIS — I2510 Atherosclerotic heart disease of native coronary artery without angina pectoris: Secondary | ICD-10-CM | POA: Insufficient documentation

## 2021-01-21 DIAGNOSIS — S81812A Laceration without foreign body, left lower leg, initial encounter: Secondary | ICD-10-CM | POA: Diagnosis not present

## 2021-01-21 DIAGNOSIS — Z7984 Long term (current) use of oral hypoglycemic drugs: Secondary | ICD-10-CM | POA: Diagnosis not present

## 2021-01-21 DIAGNOSIS — E1122 Type 2 diabetes mellitus with diabetic chronic kidney disease: Secondary | ICD-10-CM | POA: Insufficient documentation

## 2021-01-21 DIAGNOSIS — N189 Chronic kidney disease, unspecified: Secondary | ICD-10-CM | POA: Insufficient documentation

## 2021-01-21 DIAGNOSIS — Y990 Civilian activity done for income or pay: Secondary | ICD-10-CM | POA: Diagnosis not present

## 2021-01-21 DIAGNOSIS — S8992XA Unspecified injury of left lower leg, initial encounter: Secondary | ICD-10-CM | POA: Diagnosis present

## 2021-01-21 DIAGNOSIS — Z7982 Long term (current) use of aspirin: Secondary | ICD-10-CM | POA: Diagnosis not present

## 2021-01-21 DIAGNOSIS — W458XXA Other foreign body or object entering through skin, initial encounter: Secondary | ICD-10-CM | POA: Diagnosis not present

## 2021-01-21 DIAGNOSIS — I129 Hypertensive chronic kidney disease with stage 1 through stage 4 chronic kidney disease, or unspecified chronic kidney disease: Secondary | ICD-10-CM | POA: Diagnosis not present

## 2021-01-21 DIAGNOSIS — S81839A Puncture wound without foreign body, unspecified lower leg, initial encounter: Secondary | ICD-10-CM

## 2021-01-21 DIAGNOSIS — Z87891 Personal history of nicotine dependence: Secondary | ICD-10-CM | POA: Diagnosis not present

## 2021-01-21 DIAGNOSIS — Z23 Encounter for immunization: Secondary | ICD-10-CM | POA: Diagnosis not present

## 2021-01-21 LAB — CBC WITH DIFFERENTIAL/PLATELET
Abs Immature Granulocytes: 0.03 10*3/uL (ref 0.00–0.07)
Basophils Absolute: 0.1 10*3/uL (ref 0.0–0.1)
Basophils Relative: 1 %
Eosinophils Absolute: 0.1 10*3/uL (ref 0.0–0.5)
Eosinophils Relative: 1 %
HCT: 45.3 % (ref 39.0–52.0)
Hemoglobin: 15.4 g/dL (ref 13.0–17.0)
Immature Granulocytes: 0 %
Lymphocytes Relative: 16 %
Lymphs Abs: 1.9 10*3/uL (ref 0.7–4.0)
MCH: 29.6 pg (ref 26.0–34.0)
MCHC: 34 g/dL (ref 30.0–36.0)
MCV: 86.9 fL (ref 80.0–100.0)
Monocytes Absolute: 0.5 10*3/uL (ref 0.1–1.0)
Monocytes Relative: 4 %
Neutro Abs: 9.3 10*3/uL — ABNORMAL HIGH (ref 1.7–7.7)
Neutrophils Relative %: 78 %
Platelets: 197 10*3/uL (ref 150–400)
RBC: 5.21 MIL/uL (ref 4.22–5.81)
RDW: 11.7 % (ref 11.5–15.5)
WBC: 11.9 10*3/uL — ABNORMAL HIGH (ref 4.0–10.5)
nRBC: 0 % (ref 0.0–0.2)

## 2021-01-21 LAB — BASIC METABOLIC PANEL
Anion gap: 11 (ref 5–15)
BUN: 14 mg/dL (ref 6–20)
CO2: 23 mmol/L (ref 22–32)
Calcium: 9 mg/dL (ref 8.9–10.3)
Chloride: 104 mmol/L (ref 98–111)
Creatinine, Ser: 1.05 mg/dL (ref 0.61–1.24)
GFR, Estimated: >60 mL/min (ref >60)
Glucose, Bld: 180 mg/dL — ABNORMAL HIGH (ref 70–99)
Potassium: 3.7 mmol/L (ref 3.5–5.1)
Sodium: 138 mmol/L (ref 135–145)

## 2021-01-21 MED ORDER — CEFAZOLIN SODIUM 1 G IJ SOLR: 1.0000 g | Freq: Once | INTRAMUSCULAR | Status: DC

## 2021-01-21 MED ORDER — SODIUM CHLORIDE 0.9 % IV SOLN: INTRAVENOUS | Status: DC | PRN

## 2021-01-21 MED ORDER — TETANUS-DIPHTH-ACELL PERTUSSIS 5-2.5-18.5 LF-MCG/0.5 IM SUSY
0.5000 mL | PREFILLED_SYRINGE | Freq: Once | INTRAMUSCULAR | Status: AC
  Administered 2021-01-21: 0.5 mL via INTRAMUSCULAR
  Filled 2021-01-21: qty 0.5

## 2021-01-21 MED ORDER — CEPHALEXIN 500 MG PO CAPS: 500.0000 mg | ORAL_CAPSULE | Freq: Four times a day (QID) | ORAL | 0 refills | Status: DC

## 2021-01-21 MED ORDER — CEFAZOLIN SODIUM-DEXTROSE 1-4 GM/50ML-% IV SOLN
1.0000 g | Freq: Once | INTRAVENOUS | Status: AC
  Administered 2021-01-21: 1 g via INTRAVENOUS

## 2021-01-21 MED ORDER — CEFAZOLIN SODIUM-DEXTROSE 1-4 GM/50ML-% IV SOLN
1.0000 g | Freq: Once | INTRAVENOUS | Status: DC
  Filled 2021-01-21: qty 50

## 2021-01-21 MED ORDER — IOHEXOL 350 MG/ML SOLN
100.0000 mL | Freq: Once | INTRAVENOUS | Status: AC | PRN
  Administered 2021-01-21: 100 mL via INTRAVENOUS

## 2021-01-21 MED ORDER — LIDOCAINE-EPINEPHRINE (PF) 2 %-1:200000 IJ SOLN
20.0000 mL | Freq: Once | INTRAMUSCULAR | Status: AC
  Administered 2021-01-21: 20 mL via INTRADERMAL
  Filled 2021-01-21: qty 20

## 2021-01-21 NOTE — ED Provider Notes (Addendum)
 [facility] Provider Note: J. Lane Molpus, MD, FACEP  CSN: [id number] MRN: [id number] ARRIVAL: 01/21/21 at 0607 ROOM: [facility]05/[facility]05   CHIEF COMPLAINT  Laceration   HISTORY OF PRESENT ILLNESS  01/21/21 6:32 AM Gregory Jacobs is a 49 y.o. male who punctured the posterolateral aspect of his left lower leg on piece of metal on a trailer at work about 215 this morning.  This occurred in [place].  He has a laceration at the site.  There was, and continues to be, brisk bleeding from the site.  He applied a tight pressure dressing prior to traveling back up from [place] but his foot began to get numb so he decided to come to the ED.  On removal of the dressing by his nurse brisk bleeding returned although it was not pulsatile.    Past Medical History:  Diagnosis Date  . 2-vessel coronary artery disease 11/20/2020  . Acute respiratory failure with hypoxia (HCC)   . Allergy   . Chronic kidney disease   . COVID-19   . Diabetes mellitus without complication (HCC) 2012  . Fatty liver   . Hyperlipidemia   . Hypertension 2012  . Pancreatitis   . Pneumonia due to COVID-19 virus 04/24/2020  . Prostatitis 02/08/2018    Past Surgical History:  Procedure Laterality Date  . CHOLECYSTECTOMY, LAPAROSCOPIC  2011  . VASECTOMY  2016    Family History  Problem Relation Age of Onset  . Breast cancer Mother   . Hypertension Father   . Heart attack Father 72  . Suicidality Sister 33  . Breast cancer Maternal Aunt 60  . Diabetes Maternal Aunt   . Diabetes Paternal Uncle     Social History   Tobacco Use  . Smoking status: Former Smoker    Packs/day: 0.50    Types: Cigarettes    Start date: 1996    Quit date: 2015    Years since quitting: 7.3  . Smokeless tobacco: Current User    Types: Chew  Vaping Use  . Vaping Use: Never used  Substance Use Topics  . Alcohol use: Yes    Comment: occasional  . Drug use: No    Prior to Admission medications   Medication Sig Start Date End Date  Taking? Authorizing Provider  cephALEXin (KEFLEX) 500 MG capsule Take 1 capsule (500 mg total) by mouth 4 (four) times daily. 01/21/21  Yes Goldston, Scott, MD  albuterol (VENTOLIN HFA) 108 (90 Base) MCG/ACT inhaler Inhale 2 puffs into the lungs every 4 (four) hours as needed for wheezing or shortness of breath. Patient not taking: Reported on 01/18/2021 04/28/20   Ghimire, Shanker M, MD  APPLE CIDER VINEGAR PO Take 2 capsules by mouth daily.     [provider]  Armodafinil 150 MG tablet Take 1 tablet (150 mg total) by mouth daily. 08/04/20   McClanahan, Kyra, NP  ascorbic acid (VITAMIN C) 500 MG tablet Take 500-1,000 mg by mouth daily.    [provider]  aspirin EC 81 MG tablet Take 81 mg by mouth in the morning. Swallow whole.    [provider]  Blood Glucose Monitoring Suppl (CONTOUR NEXT MONITOR) w/Device KIT Check blood sugar 3 times daily-E11.9. 05/04/20   McKeown, William, MD  Cholecalciferol (D3-50) 1.25 MG (50000 UT) capsule Take 1 tablet once weekly for Severe Vitamin D Deficiency 11/24/20   Corbett, Ashley, NP  Cinnamon 500 MG capsule Take 500 mg by mouth 2 (two) times daily.     [provider]    Cyanocobalamin (VITAMIN B-12) 5000 MCG SUBL Place 5,000 mcg under the tongue daily. Patient not taking: Reported on 01/18/2021    [provider]  fexofenadine (ALLEGRA) 180 MG tablet TAKE 1 TABLET BY MOUTH DAILY FOR ALLERGIES 12/10/20   Liane Comber, NP  glucose blood (CONTOUR NEXT TEST) test strip Check blood sugar 3 times daily-E11.9 05/05/20   Unk Pinto, MD  Lancets Micro Thin 33G MISC Check blood sugar 3 times daily-DX-E11.9 05/05/20   Unk Pinto, MD  lisinopril (ZESTRIL) 5 MG tablet TAKE 1 TABLET DAILY FOR BLOOD PRESSURE 11/23/20   Liane Comber, NP  metFORMIN (GLUCOPHAGE-XR) 500 MG 24 hr tablet Take 2 tabs twice daily with meals for diabetes. 10/26/20   Unk Pinto, MD  traZODone (DESYREL) 100 MG tablet TAKE 1 TABLET BY MOUTH EVERYDAY  AT BEDTIME 01/06/21   Liane Comber, NP  VITAMIN E PO Take 1 capsule by mouth daily.    [provider]  zinc gluconate 50 MG tablet Take by mouth daily.    [provider]    Allergies Patient has no known allergies.   REVIEW OF SYSTEMS  Negative except as noted here or in the History of Present Illness.   PHYSICAL EXAMINATION  Initial Vital Signs Blood pressure (!) 146/96, pulse 82, temperature 98.9 F (37.2 C), temperature source Oral, resp. rate (!) 22, height 6' 1" (1.854 m), weight 93 kg, SpO2 97 %.  Examination General: Well-developed, well-nourished male in no acute distress; appearance consistent with age of record HENT: normocephalic; atraumatic Eyes: Normal appearing Neck: supple Heart: regular rate and rhythm Lungs: clear to auscultation bilaterally Abdomen: soft; nondistended; nontender; bowel sounds present Extremities: No deformity; full range of motion; left foot pulses DP +2, PT +1; laceration (about 1.5 cm) of the posterolateral aspect of the distal left lower leg with underlying hematoma, brisk oozing of blood present, compartments soft, foot and toes pink with brisk capillary refill and intact sensation:    Neurologic: Awake, alert and oriented; motor function intact in all extremities and symmetric; no facial droop Skin: Warm and dry Psychiatric: Normal mood and affect   RESULTS  Summary of this visit's results, reviewed and interpreted by myself:   EKG Interpretation  Date/Time:    Ventricular Rate:    PR Interval:    QRS Duration:   QT Interval:    QTC Calculation:   R Axis:     Text Interpretation:        Laboratory Studies: Results for orders placed or performed during the hospital encounter of 01/21/21 (from the past 24 hour(s))  CBC with Differential/Platelet     Status: Abnormal   Collection Time: 01/21/21  7:00 AM  Result Value Ref Range   WBC 11.9 (H) 4.0 - 10.5 K/uL   RBC 5.21 4.22 - 5.81 MIL/uL   Hemoglobin  15.4 13.0 - 17.0 g/dL   HCT 45.3 39.0 - 52.0 %   MCV 86.9 80.0 - 100.0 fL   MCH 29.6 26.0 - 34.0 pg   MCHC 34.0 30.0 - 36.0 g/dL   RDW 11.7 11.5 - 15.5 %   Platelets 197 150 - 400 K/uL   nRBC 0.0 0.0 - 0.2 %   Neutrophils Relative % 78 %   Neutro Abs 9.3 (H) 1.7 - 7.7 K/uL   Lymphocytes Relative 16 %   Lymphs Abs 1.9 0.7 - 4.0 K/uL   Monocytes Relative 4 %   Monocytes Absolute 0.5 0.1 - 1.0 K/uL   Eosinophils Relative 1 %  Eosinophils Absolute 0.1 0.0 - 0.5 K/uL   Basophils Relative 1 %   Basophils Absolute 0.1 0.0 - 0.1 K/uL   Immature Granulocytes 0 %   Abs Immature Granulocytes 0.03 0.00 - 0.07 K/uL  Basic metabolic panel     Status: Abnormal   Collection Time: 01/21/21  7:00 AM  Result Value Ref Range   Sodium 138 135 - 145 mmol/L   Potassium 3.7 3.5 - 5.1 mmol/L   Chloride 104 98 - 111 mmol/L   CO2 23 22 - 32 mmol/L   Glucose, Bld 180 (H) 70 - 99 mg/dL   BUN 14 6 - 20 mg/dL   Creatinine, Ser 1.05 0.61 - 1.24 mg/dL   Calcium 9.0 8.9 - 10.3 mg/dL   GFR, Estimated >60 >60 mL/min   Anion gap 11 5 - 15   Imaging Studies: CT ANGIO LOW EXTREM LEFT W &/OR WO CONTRAST  Result Date: 01/21/2021 CLINICAL DATA:  Laceration to the left lower leg. Evaluate for arterial injury. History of diabetes, hypertension and chronic kidney disease. EXAM: CT ANGIOGRAPHY OF ABDOMINAL AORTA WITH ILIOFEMORAL RUNOFF TECHNIQUE: Multidetector CT imaging of the abdomen, pelvis and lower extremities was performed using the standard protocol during bolus administration of intravenous contrast. Multiplanar CT image reconstructions and MIPs were obtained to evaluate the vascular anatomy. CONTRAST:  11m OMNIPAQUE IOHEXOL 350 MG/ML SOLN COMPARISON:  Chest CT-10/15/2020 FINDINGS: VASCULAR Aorta: There is a moderate amount of mixed calcified and noncalcified atherosclerotic plaque within the imaged portions of the infrarenal abdominal aorta. Celiac: Not imaged SMA: Not imaged Renals: Not imaged IMA: Widely patent.  RIGHT Lower Extremity Inflow: There is a minimal amount of mixed calcified and noncalcified atherosclerotic plaque involving the right common iliac artery, not resulting in hemodynamically significant stenosis. The right internal iliac artery was mildly disease though patent and of normal caliber. The right external iliac artery is of normal caliber and widely patent without hemodynamically significant narrowing. Outflow: The right common femoral artery is widely patent without hemodynamically significant narrowing. The right deep and superficial femoral arteries are widely patent without hemodynamically significant narrowing. The right above and below-knee popliteal arteries are of normal caliber and widely patent without hemodynamically significant narrowing. Runoff: Three vessel runoff to the right lower leg with expected tapering of the peroneal artery at the level of the distal calf. The right-sided dorsalis pedis artery is patent to the level of the forefoot. No discrete lumen filling defects to suggest distal embolism. LEFT Lower Extremity Inflow: There is a moderate amount of eccentric slightly irregular predominantly noncalcified atherosclerotic plaque involving the left common iliac artery, not resulting in hemodynamically significant stenosis. The left common iliac artery appears mildly ectatic measuring 1.5 cm in diameter (image 36, series 4). The left internal iliac artery is mildly disease though patent and of normal caliber. The left external iliac artery is of normal caliber and widely patent without hemodynamically significant narrowing. Outflow: The left common femoral artery is widely patent without hemodynamically significant narrowing. The left deep and superficial femoral arteries are widely patent without a hemodynamically significant narrowing. Note is made of asymmetrically reduced/sluggish flow to the left lower extremity with decreased opacification beginning at the mid aspect of the left  superficial femoral artery and distally. The left above knee popliteal artery is of normal caliber and widely patent without hemodynamically significant narrowing however there is suboptimal opacification of the left below-knee popliteal artery. Runoff: Not evaluated secondary to presumed basal spasm in the setting of known laceration. Veins: The  IVC and pelvic venous systems appear widely patent on this arterial phase examination. Review of the MIP images confirms the above findings. _________________________________________________________ Evaluation of abdominal organs is limited to the arterial phase of enhancement. NON-VASCULAR Hepatobiliary: Not imaged Pancreas: Not imaged Spleen: Not imaged Adrenals/Urinary Tract: The inferior poles of the imaged bilateral kidneys appear normal. No definitive evidence of urinary obstruction. The bilateral adrenal glands were not imaged. Normal appearance of the urinary bladder given degree of distention. Stomach/Bowel: Moderate colonic stool burden without evidence of enteric obstruction, though note, the superior aspect of the abdomen was not imaged. Lymphatic: No bulky retroperitoneal, mesenteric, pelvic or inguinal lymphadenopathy or. Reproductive: Dystrophic calcifications within. No free normal sized fluid the pelvic cul-de-sac. Prostate gland Other: Small mesenteric fat containing bilateral inguinal hernias. Musculoskeletal: There is a punctate (approximately 3 mm) radiopaque foreign body involving the subcutaneous tissues of the posterior aspect of the left lower leg (image 484, series 4), with minimal amount of adjacent subcutaneous stranding and scattered foci of subcutaneous emphysema compatible with provided history of recent laceration. No associated fracture. IMPRESSION: VASCULAR 1. Asymmetrically reduced arterial flow beginning at the level of the mid aspect of the left superficial femoral artery with nondiagnostic evaluation of the left tibial runoff presumably  secondary to vasospasm in the setting of provided history of lower leg laceration. No sizable hematoma at site of the reported laceration. 2. Widely patent three-vessel runoff to the right lower leg and foot. The right-sided dorsalis pedis artery is patent to the level the forefoot. No evidence of distal embolism. 3. Moderate amount of atherosclerotic plaque within the abdominal aorta and bilateral common iliac arteries, left greater than right. Aortic Atherosclerosis (ICD10-I70.0). NON-VASCULAR 1. Punctate (3 mm) radiopaque foreign body imbedded within the subcutaneous tissues of posterior aspect of the left lower leg with minimal amount of adjacent subcutaneous stranding and scattered foci of subcutaneous emphysema, compatible with provided history of laceration. No associated fracture or sizable hematoma. Electronically Signed   By: John  Watts M.D.   On: 01/21/2021 08:49   CT ANGIO LOW EXTREM RIGHT W &/OR WO CONTRAST  Result Date: 01/21/2021 CLINICAL DATA:  Laceration to the left lower leg. Evaluate for arterial injury. History of diabetes, hypertension and chronic kidney disease. EXAM: CT ANGIOGRAPHY OF ABDOMINAL AORTA WITH ILIOFEMORAL RUNOFF TECHNIQUE: Multidetector CT imaging of the abdomen, pelvis and lower extremities was performed using the standard protocol during bolus administration of intravenous contrast. Multiplanar CT image reconstructions and MIPs were obtained to evaluate the vascular anatomy. CONTRAST:  100mL OMNIPAQUE IOHEXOL 350 MG/ML SOLN COMPARISON:  Chest CT-10/15/2020 FINDINGS: VASCULAR Aorta: There is a moderate amount of mixed calcified and noncalcified atherosclerotic plaque within the imaged portions of the infrarenal abdominal aorta. Celiac: Not imaged SMA: Not imaged Renals: Not imaged IMA: Widely patent. RIGHT Lower Extremity Inflow: There is a minimal amount of mixed calcified and noncalcified atherosclerotic plaque involving the right common iliac artery, not resulting in  hemodynamically significant stenosis. The right internal iliac artery was mildly disease though patent and of normal caliber. The right external iliac artery is of normal caliber and widely patent without hemodynamically significant narrowing. Outflow: The right common femoral artery is widely patent without hemodynamically significant narrowing. The right deep and superficial femoral arteries are widely patent without hemodynamically significant narrowing. The right above and below-knee popliteal arteries are of normal caliber and widely patent without hemodynamically significant narrowing. Runoff: Three vessel runoff to the right lower leg with expected tapering of the peroneal artery at the level   of the distal calf. The right-sided dorsalis pedis artery is patent to the level of the forefoot. No discrete lumen filling defects to suggest distal embolism. LEFT Lower Extremity Inflow: There is a moderate amount of eccentric slightly irregular predominantly noncalcified atherosclerotic plaque involving the left common iliac artery, not resulting in hemodynamically significant stenosis. The left common iliac artery appears mildly ectatic measuring 1.5 cm in diameter (image 36, series 4). The left internal iliac artery is mildly disease though patent and of normal caliber. The left external iliac artery is of normal caliber and widely patent without hemodynamically significant narrowing. Outflow: The left common femoral artery is widely patent without hemodynamically significant narrowing. The left deep and superficial femoral arteries are widely patent without a hemodynamically significant narrowing. Note is made of asymmetrically reduced/sluggish flow to the left lower extremity with decreased opacification beginning at the mid aspect of the left superficial femoral artery and distally. The left above knee popliteal artery is of normal caliber and widely patent without hemodynamically significant narrowing however  there is suboptimal opacification of the left below-knee popliteal artery. Runoff: Not evaluated secondary to presumed basal spasm in the setting of known laceration. Veins: The IVC and pelvic venous systems appear widely patent on this arterial phase examination. Review of the MIP images confirms the above findings. _________________________________________________________ Evaluation of abdominal organs is limited to the arterial phase of enhancement. NON-VASCULAR Hepatobiliary: Not imaged Pancreas: Not imaged Spleen: Not imaged Adrenals/Urinary Tract: The inferior poles of the imaged bilateral kidneys appear normal. No definitive evidence of urinary obstruction. The bilateral adrenal glands were not imaged. Normal appearance of the urinary bladder given degree of distention. Stomach/Bowel: Moderate colonic stool burden without evidence of enteric obstruction, though note, the superior aspect of the abdomen was not imaged. Lymphatic: No bulky retroperitoneal, mesenteric, pelvic or inguinal lymphadenopathy or. Reproductive: Dystrophic calcifications within. No free normal sized fluid the pelvic cul-de-sac. Prostate gland Other: Small mesenteric fat containing bilateral inguinal hernias. Musculoskeletal: There is a punctate (approximately 3 mm) radiopaque foreign body involving the subcutaneous tissues of the posterior aspect of the left lower leg (image 484, series 4), with minimal amount of adjacent subcutaneous stranding and scattered foci of subcutaneous emphysema compatible with provided history of recent laceration. No associated fracture. IMPRESSION: VASCULAR 1. Asymmetrically reduced arterial flow beginning at the level of the mid aspect of the left superficial femoral artery with nondiagnostic evaluation of the left tibial runoff presumably secondary to vasospasm in the setting of provided history of lower leg laceration. No sizable hematoma at site of the reported laceration. 2. Widely patent three-vessel  runoff to the right lower leg and foot. The right-sided dorsalis pedis artery is patent to the level the forefoot. No evidence of distal embolism. 3. Moderate amount of atherosclerotic plaque within the abdominal aorta and bilateral common iliac arteries, left greater than right. Aortic Atherosclerosis (ICD10-I70.0). NON-VASCULAR 1. Punctate (3 mm) radiopaque foreign body imbedded within the subcutaneous tissues of posterior aspect of the left lower leg with minimal amount of adjacent subcutaneous stranding and scattered foci of subcutaneous emphysema, compatible with provided history of laceration. No associated fracture or sizable hematoma. Electronically Signed   By: Sandi Mariscal M.D.   On: 01/21/2021 08:49    ED COURSE and MDM  Nursing notes, initial and subsequent vitals signs, including pulse oximetry, reviewed and interpreted by myself.  Vitals:   01/21/21 0830 01/21/21 0930 01/21/21 1000 01/21/21 1030  BP: (!) 148/102 124/85 128/88   Pulse: 76 64 69  Resp: _0 Temp:      TempSrc:      SpO2: 98% 97% 100%   Weight:      Height:       Medications  lidocaine-EPINEPHrine (XYLOCAINE W/EPI) 2 %-1:200000 (PF) injection 20 mL (has no administration in time range)  ceFAZolin (ANCEF) injection 1 g (has no administration in time range)   6:52 AM Ancef 1 g ordered for deep puncture wound with possible vascular injury.  Wound explored with a sterile cotton swab and found to be about 1.5 inches deep.  Wound closure deferred pending arteriogram.  Signed out to Dr. Regenia Skeeter.   PROCEDURES  Procedures LACERATION REPAIR Performed by: Karen Chafe Bobbyjoe Pabst Authorized by: Karen Chafe Janiesha Diehl Consent: Verbal consent obtained. Risks and benefits: risks, benefits and alternatives were discussed Consent given by: patient Patient identity confirmed: provided demographic data Prepped and Draped in normal sterile fashion Wound explored, probe went into soft tissue of lower leg about 1.5 inches; superficial  hematoma evacuated  Laceration Location: Left lower leg  Laceration Length: 1.5 cm  No Foreign Bodies seen or palpated  Anesthesia: local infiltration  Local anesthetic: lidocaine 2% with epinephrine  Anesthetic total: 5 ml  Irrigation method: syringe Amount of cleaning: standard  Wound closure deferred pending angiogram of lower leg  Patient tolerance: Patient tolerated the procedure well with no immediate complications.  CRITICAL CARE Performed by: Karen Chafe Zulma Court Total critical care time: 30 minutes Critical care time was exclusive of separately billable procedures and treating other patients. Critical care was necessary to treat or prevent imminent or life-threatening deterioration. Critical care was time spent personally by me on the following activities: development of treatment plan with patient and/or surrogate as well as nursing, discussions with consultants, evaluation of patient's response to treatment, examination of patient, obtaining history from patient or surrogate, ordering and performing treatments and interventions, ordering and review of laboratory studies, ordering and review of radiographic studies, pulse oximetry and re-evaluation of patient's condition.   ED DIAGNOSES     ICD-10-CM   1. Penetrating traumatic injury of lower leg  B86.754G        Shanon Rosser, MD 01/21/21 2236    Shanon Rosser, MD 01/22/21 [phone]

## 2021-01-21 NOTE — ED Provider Notes (Signed)
Care transferred to me.  Patient has a bounding DP pulse in the left foot.  He reports a little bit of tingling in his foot.  However this might be from the Coban applied.  Otherwise, CTA shows what is probably spasm.  I discussed with Dr. Carlis Abbott who has reviewed the CT and this seems to not be a vascular issue, especially given the strong DP pulse.  Recommends ED closure assuming no obvious arterial injury when the dressing is taken down.  Marland Kitchen.Laceration Repair  Date/Time: 01/21/2021 10:09 AM Performed by: Sherwood Gambler, MD Authorized by: Sherwood Gambler, MD   Consent:    Consent obtained:  Verbal   Consent given by:  Patient Universal protocol:    Patient identity confirmed:  Verbally with patient Anesthesia:    Anesthesia method:  Local infiltration   Local anesthetic:  Lidocaine 2% WITH epi Laceration details:    Location:  Leg   Leg location:  L lower leg   Length (cm):  2 Exploration:    Hemostasis achieved with:  Direct pressure Treatment:    Area cleansed with:  Saline   Amount of cleaning:  Extensive   Irrigation solution:  Sterile saline   Debridement:  None Skin repair:    Repair method:  Sutures   Suture size:  4-0   Suture material:  Prolene   Suture technique:  Simple interrupted   Number of sutures:  4 Approximation:    Approximation:  Close Repair type:    Repair type:  Intermediate Post-procedure details:    Dressing:  Bulky dressing   Procedure completion:  Tolerated well, no immediate complications    I have repaired the wound as above.  On initial removal of the bandage, there is no bleeding.  I cannot see the entire depth of the wound as it is more of a puncture wound then a true laceration.  I could not find this small metallic foreign body and the wound was irrigated extensively with saline.  I will place on antibiotics and update his tetanus (he thinks he is up-to-date but he cannot remember).  Wound closed otherwise with some bleeding and I suspect he  has some mild venous injury that was causing the initial bleeding seen by Dr. Florina Ou.  Follow-up with PCP for suture removal.  Discussed return precautions.   Sherwood Gambler, MD 01/21/21 1014

## 2021-01-21 NOTE — ED Notes (Signed)
Patient transported to CT 

## 2021-01-21 NOTE — Discharge Instructions (Signed)
If you notice increasing pain, redness, swelling, fever, or any other new/concerning symptoms then return to the ER.

## 2021-01-21 NOTE — ED Triage Notes (Signed)
Pt has laceration to left lower leg from metal inside trailer at work. Pt has bandage on with bleeding controlled.

## 2021-01-27 ENCOUNTER — Other Ambulatory Visit: Payer: Self-pay | Admitting: Internal Medicine

## 2021-01-27 DIAGNOSIS — E119 Type 2 diabetes mellitus without complications: Secondary | ICD-10-CM

## 2021-01-29 ENCOUNTER — Other Ambulatory Visit (HOSPITAL_BASED_OUTPATIENT_CLINIC_OR_DEPARTMENT_OTHER): Payer: Self-pay | Admitting: Orthopedic Surgery

## 2021-01-29 ENCOUNTER — Inpatient Hospital Stay (HOSPITAL_COMMUNITY)
Admission: EM | Admit: 2021-01-29 | Discharge: 2021-01-31 | DRG: 603 | Disposition: A | Discharge status: Home / Self Care | Payer: No Typology Code available for payment source | Source: Ambulatory Visit | Attending: Internal Medicine | Admitting: Internal Medicine

## 2021-01-29 ENCOUNTER — Ambulatory Visit (HOSPITAL_BASED_OUTPATIENT_CLINIC_OR_DEPARTMENT_OTHER)
Admission: RE | Admit: 2021-01-29 | Discharge: 2021-01-29 | Disposition: A | Discharge status: Home / Self Care | Payer: No Typology Code available for payment source | Source: Ambulatory Visit | Attending: Orthopedic Surgery | Admitting: Orthopedic Surgery

## 2021-01-29 ENCOUNTER — Other Ambulatory Visit: Payer: Self-pay

## 2021-01-29 ENCOUNTER — Encounter (HOSPITAL_COMMUNITY): Payer: Self-pay | Admitting: Emergency Medicine

## 2021-01-29 DIAGNOSIS — W268XXS Contact with other sharp object(s), not elsewhere classified, sequela: Secondary | ICD-10-CM | POA: Diagnosis not present

## 2021-01-29 DIAGNOSIS — E1122 Type 2 diabetes mellitus with diabetic chronic kidney disease: Secondary | ICD-10-CM | POA: Diagnosis not present

## 2021-01-29 DIAGNOSIS — E1169 Type 2 diabetes mellitus with other specified complication: Secondary | ICD-10-CM | POA: Diagnosis present

## 2021-01-29 DIAGNOSIS — S81832S Puncture wound without foreign body, left lower leg, sequela: Secondary | ICD-10-CM

## 2021-01-29 DIAGNOSIS — Z20822 Contact with and (suspected) exposure to covid-19: Secondary | ICD-10-CM | POA: Diagnosis present

## 2021-01-29 DIAGNOSIS — Z833 Family history of diabetes mellitus: Secondary | ICD-10-CM | POA: Diagnosis not present

## 2021-01-29 DIAGNOSIS — L03116 Cellulitis of left lower limb: Secondary | ICD-10-CM | POA: Diagnosis present

## 2021-01-29 DIAGNOSIS — M79605 Pain in left leg: Secondary | ICD-10-CM

## 2021-01-29 DIAGNOSIS — E785 Hyperlipidemia, unspecified: Secondary | ICD-10-CM | POA: Diagnosis present

## 2021-01-29 DIAGNOSIS — M79604 Pain in right leg: Secondary | ICD-10-CM

## 2021-01-29 DIAGNOSIS — N182 Chronic kidney disease, stage 2 (mild): Secondary | ICD-10-CM | POA: Diagnosis not present

## 2021-01-29 DIAGNOSIS — Z8249 Family history of ischemic heart disease and other diseases of the circulatory system: Secondary | ICD-10-CM | POA: Diagnosis not present

## 2021-01-29 DIAGNOSIS — I10 Essential (primary) hypertension: Secondary | ICD-10-CM | POA: Diagnosis present

## 2021-01-29 DIAGNOSIS — E1129 Type 2 diabetes mellitus with other diabetic kidney complication: Secondary | ICD-10-CM | POA: Diagnosis present

## 2021-01-29 DIAGNOSIS — Z87891 Personal history of nicotine dependence: Secondary | ICD-10-CM

## 2021-01-29 DIAGNOSIS — Z7984 Long term (current) use of oral hypoglycemic drugs: Secondary | ICD-10-CM

## 2021-01-29 LAB — COMPREHENSIVE METABOLIC PANEL
ALT: 25 U/L (ref 0–44)
AST: 21 U/L (ref 15–41)
Albumin: 4.3 g/dL (ref 3.5–5.0)
Alkaline Phosphatase: 55 U/L (ref 38–126)
Anion gap: 8 (ref 5–15)
BUN: 21 mg/dL — ABNORMAL HIGH (ref 6–20)
CO2: 25 mmol/L (ref 22–32)
Calcium: 9.6 mg/dL (ref 8.9–10.3)
Chloride: 105 mmol/L (ref 98–111)
Creatinine, Ser: 1.02 mg/dL (ref 0.61–1.24)
GFR, Estimated: >60 mL/min (ref >60)
Glucose, Bld: 98 mg/dL (ref 70–99)
Potassium: 4 mmol/L (ref 3.5–5.1)
Sodium: 138 mmol/L (ref 135–145)
Total Bilirubin: 0.6 mg/dL (ref 0.3–1.2)
Total Protein: 7.5 g/dL (ref 6.5–8.1)

## 2021-01-29 LAB — CBC WITH DIFFERENTIAL/PLATELET
Abs Immature Granulocytes: 0.03 10*3/uL (ref 0.00–0.07)
Basophils Absolute: 0.1 10*3/uL (ref 0.0–0.1)
Basophils Relative: 1 %
Eosinophils Absolute: 0.3 10*3/uL (ref 0.0–0.5)
Eosinophils Relative: 3 %
HCT: 47 % (ref 39.0–52.0)
Hemoglobin: 15.8 g/dL (ref 13.0–17.0)
Immature Granulocytes: 0 %
Lymphocytes Relative: 32 %
Lymphs Abs: 3.6 10*3/uL (ref 0.7–4.0)
MCH: 29.5 pg (ref 26.0–34.0)
MCHC: 33.6 g/dL (ref 30.0–36.0)
MCV: 87.9 fL (ref 80.0–100.0)
Monocytes Absolute: 0.7 10*3/uL (ref 0.1–1.0)
Monocytes Relative: 6 %
Neutro Abs: 6.6 10*3/uL (ref 1.7–7.7)
Neutrophils Relative %: 58 %
Platelets: 260 10*3/uL (ref 150–400)
RBC: 5.35 MIL/uL (ref 4.22–5.81)
RDW: 11.9 % (ref 11.5–15.5)
WBC: 11.3 10*3/uL — ABNORMAL HIGH (ref 4.0–10.5)
nRBC: 0 % (ref 0.0–0.2)

## 2021-01-29 LAB — LACTIC ACID, PLASMA
Lactic Acid, Venous: 1.8 mmol/L (ref 0.5–1.9)
Lactic Acid, Venous: 1.6 mmol/L (ref 0.5–1.9)

## 2021-01-29 MED ORDER — ARMODAFINIL 150 MG PO TABS
150.0000 mg | ORAL_TABLET | Freq: Every day | ORAL | Status: DC | PRN
Start: 2021-01-29 — End: 2021-01-30

## 2021-01-29 MED ORDER — ACETAMINOPHEN 650 MG RE SUPP: 650.0000 mg | Freq: Four times a day (QID) | RECTAL | Status: DC | PRN

## 2021-01-29 MED ORDER — ALBUTEROL SULFATE HFA 108 (90 BASE) MCG/ACT IN AERS: 2.0000 | INHALATION_SPRAY | RESPIRATORY_TRACT | Status: DC | PRN

## 2021-01-29 MED ORDER — ROSUVASTATIN CALCIUM 5 MG PO TABS: 5.0000 mg | ORAL_TABLET | ORAL | Status: DC

## 2021-01-29 MED ORDER — ENOXAPARIN SODIUM 40 MG/0.4ML IJ SOSY
40.0000 mg | PREFILLED_SYRINGE | INTRAMUSCULAR | Status: DC
  Administered 2021-01-29 – 2021-01-30 (×2): 40 mg via SUBCUTANEOUS
  Filled 2021-01-29 (×2): qty 0.4

## 2021-01-29 MED ORDER — METFORMIN HCL ER 500 MG PO TB24: 1000.0000 mg | ORAL_TABLET | Freq: Two times a day (BID) | ORAL | Status: DC

## 2021-01-29 MED ORDER — VANCOMYCIN HCL 2000 MG/400ML IV SOLN
2000.0000 mg | INTRAVENOUS | Status: AC
  Administered 2021-01-29: 2000 mg via INTRAVENOUS
  Filled 2021-01-29: qty 400

## 2021-01-29 MED ORDER — ASPIRIN EC 81 MG PO TBEC
81.0000 mg | DELAYED_RELEASE_TABLET | Freq: Every day | ORAL | Status: DC
  Administered 2021-01-30 – 2021-01-31 (×2): 81 mg via ORAL
  Filled 2021-01-29 (×2): qty 1

## 2021-01-29 MED ORDER — TRAZODONE HCL 100 MG PO TABS: 100.0000 mg | ORAL_TABLET | Freq: Every evening | ORAL | Status: DC | PRN

## 2021-01-29 MED ORDER — SODIUM CHLORIDE 0.9 % IV SOLN
1.0000 g | INTRAVENOUS | Status: DC
  Administered 2021-01-30 (×2): 1 g via INTRAVENOUS
  Filled 2021-01-29 (×2): qty 10
  Filled 2021-01-29: qty 1

## 2021-01-29 MED ORDER — ACETAMINOPHEN 325 MG PO TABS: 650.0000 mg | ORAL_TABLET | Freq: Four times a day (QID) | ORAL | Status: DC | PRN

## 2021-01-29 MED ORDER — ONDANSETRON HCL 4 MG PO TABS: 4.0000 mg | ORAL_TABLET | Freq: Four times a day (QID) | ORAL | Status: DC | PRN

## 2021-01-29 MED ORDER — ONDANSETRON HCL 4 MG/2ML IJ SOLN: 4.0000 mg | Freq: Four times a day (QID) | INTRAMUSCULAR | Status: DC | PRN

## 2021-01-29 MED ORDER — LISINOPRIL 5 MG PO TABS
5.0000 mg | ORAL_TABLET | Freq: Every day | ORAL | Status: DC
  Administered 2021-01-30 – 2021-01-31 (×2): 5 mg via ORAL
  Filled 2021-01-29 (×2): qty 1

## 2021-01-29 NOTE — ED Provider Notes (Signed)
[facility]-EMERGENCY DEPT Provider Note   CSN: [id number] Arrival date & time: 01/29/21  1647     History Chief Complaint  Patient presents with  . Leg Swelling    Gregory Jacobs is a 49 y.o. male.  Patient presents to the emergency department with a chief complaint of left leg cellulitis.  He states that he was cut with a metal bar at his work approximately 1 week ago.  He had CT angio last week that had findings thought to be secondary to vasospasm.  He didn't have any physical exam findings consistent with arterial injury and his wound was closed after consultation with vascular surgery.   Patient has since then had negative DVT studies and has been on antibiotics for the past week.  He continues to have severe pain with some worsening redness around the injury.  He was seen by orthopedics today and was referred to [facility] for IV antibiotics and admission for cellulitis failing outpatient therapy.  He continues to report significant pain.  Denies having known fever.  Reports increased swelling throughout the day.  The history is provided by the patient. No language interpreter was used.       Past Medical History:  Diagnosis Date  . 2-vessel coronary artery disease 11/20/2020  . Acute respiratory failure with hypoxia (HCC)   . Allergy   . Chronic kidney disease   . COVID-19   . Diabetes mellitus without complication (HCC) 2012  . Fatty liver   . Hyperlipidemia   . Hypertension 2012  . Pancreatitis   . Pneumonia due to COVID-19 virus 04/24/2020  . Prostatitis 02/08/2018    Patient Active Problem List   Diagnosis Date Noted  . Metabolic syndrome 12/04/2020  . 2-vessel coronary artery disease 11/20/2020  . Abnormal liver CT-question cirrhosis 10/02/2020  . Pancreatic lesion-diminutive hypodensity stable times years 10/02/2020  . 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 Hepatomegaly 05/15/2020  . Exertional dyspnea 04/26/2020  . Personal history of covid-19  (04/16/2020) 04/17/2020  . Aortic atherosclerosis (HCC) 01/16/2020  . Pityriasis rosea 06/25/2018  . Abnormal EKG 06/25/2018  . Prostate nodule 06/22/2018  . Stage 2 chronic kidney disease due to type 2 diabetes mellitus (HCC) 06/22/2018  . Overweight (BMI 25.0-29.9) 02/08/2018  . Essential hypertension 08/25/2017  . Hyperlipidemia associated with type 2 diabetes mellitus (HCC) 08/25/2017  . 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 Vitamin D deficiency 08/25/2017  . NAFLD (nonalcoholic fatty liver disease)     Past Surgical History:  Procedure Laterality Date  . CHOLECYSTECTOMY, LAPAROSCOPIC  2011  . VASECTOMY  2016       Family History  Problem Relation Age of Onset  . Breast cancer Mother   . Hypertension Father   . Heart attack Father 72  . Suicidality Sister 33  . Breast cancer Maternal Aunt 60  . Diabetes Maternal Aunt   . Diabetes Paternal Uncle     Social History   Tobacco Use  . Smoking status: Former Smoker    Packs/day: 0.50    Types: Cigarettes    Start date: 1996    Quit date: 2015    Years since quitting: 7.3  . Smokeless tobacco: Current User    Types: Chew  Vaping Use  . Vaping Use: Never used  Substance Use Topics  . Alcohol use: Yes    Comment: occasional  . Drug use: No    Home Medications Prior to Admission medications   Medication Sig   Start Date End Date Taking? Authorizing Provider  albuterol (VENTOLIN HFA) 108 (90 Base) MCG/ACT inhaler Inhale 2 puffs into the lungs every 4 (four) hours as needed for wheezing or shortness of breath. Patient not taking: Reported on 01/18/2021 04/28/20   Jonetta Osgood, MD  APPLE CIDER VINEGAR PO Take 2 capsules by mouth daily.     [provider]  Armodafinil 150 MG tablet Take 1 tablet (150 mg total) by mouth daily. 08/04/20   Garnet Sierras, NP  ascorbic acid (VITAMIN C) 500 MG tablet Take 500-1,000 mg by mouth daily.    [provider]  aspirin EC 81 MG  tablet Take 81 mg by mouth in the morning. Swallow whole.    [provider]  Blood Glucose Monitoring Suppl (CONTOUR NEXT MONITOR) w/Device KIT Check blood sugar 3 times daily-E11.9. 05/04/20   Unk Pinto, MD  cephALEXin (KEFLEX) 500 MG capsule Take 1 capsule (500 mg total) by mouth 4 (four) times daily. 01/21/21   Sherwood Gambler, MD  Cholecalciferol (D3-50) 1.25 MG (50000 UT) capsule Take 1 tablet once weekly for Severe Vitamin D Deficiency 11/24/20   Liane Comber, NP  Cinnamon 500 MG capsule Take 500 mg by mouth 2 (two) times daily.     [provider]  Cyanocobalamin (VITAMIN B-12) 5000 MCG SUBL Place 5,000 mcg under the tongue daily. Patient not taking: Reported on 01/18/2021    [provider]  fexofenadine (ALLEGRA) 180 MG tablet TAKE 1 TABLET BY MOUTH DAILY FOR ALLERGIES 12/10/20   Liane Comber, NP  glucose blood (CONTOUR NEXT TEST) test strip Check blood sugar 3 times daily-E11.9 05/05/20   Unk Pinto, MD  Lancets Micro Thin 33G MISC Check blood sugar 3 times daily-DX-E11.9 05/05/20   Unk Pinto, MD  lisinopril (ZESTRIL) 5 MG tablet TAKE 1 TABLET DAILY FOR BLOOD PRESSURE 11/23/20   Liane Comber, NP  metFORMIN (GLUCOPHAGE-XR) 500 MG 24 hr tablet TAKE 2 TABLETS BY MOUTH TWICE DAILY WITH MEALS FOR DIABETES. 01/27/21   Liane Comber, NP  traZODone (DESYREL) 100 MG tablet TAKE 1 TABLET BY MOUTH EVERYDAY AT BEDTIME 01/06/21   Liane Comber, NP  VITAMIN E PO Take 1 capsule by mouth daily.    [provider]  zinc gluconate 50 MG tablet Take by mouth daily.    [provider]    Allergies    Patient has no known allergies.  Review of Systems   Review of Systems  All other systems reviewed and are negative.   Physical Exam Updated Vital Signs BP (!) 147/86   Pulse 72   Temp 98.2 F (36.8 C) (Oral)   Resp 16   SpO2 98%   Physical Exam Vitals and nursing note reviewed.  Constitutional:      Appearance: He is  well-developed.  HENT:     Head: Normocephalic and atraumatic.  Eyes:     Conjunctiva/sclera: Conjunctivae normal.  Cardiovascular:     Rate and Rhythm: Normal rate and regular rhythm.     Pulses: Normal pulses.     Heart sounds: No murmur heard.     Comments: Intact distal pulses bilaterally Pulmonary:     Effort: Pulmonary effort is normal. No respiratory distress.     Breath sounds: Normal breath sounds.  Abdominal:     Palpations: Abdomen is soft.     Tenderness: There is no abdominal tenderness.  Musculoskeletal:     Cervical back: Neck supple.  Skin:    General: Skin is warm and dry.  Comments: Erythema extending outward from the injury on the left calf No purulent discharge  Neurological:     Mental Status: He is alert and oriented to person, place, and time.  Psychiatric:        Mood and Affect: Mood normal.        Behavior: Behavior normal.     ED Results / Procedures / Treatments   Labs (all labs ordered are listed, but only abnormal results are displayed) Labs Reviewed  CBC WITH DIFFERENTIAL/PLATELET - Abnormal; Notable for the following components:      Result Value   WBC 11.3 (*)    All other components within normal limits  COMPREHENSIVE METABOLIC PANEL - Abnormal; Notable for the following components:   BUN 21 (*)    All other components within normal limits  CULTURE, BLOOD (SINGLE)  LACTIC ACID, PLASMA  LACTIC ACID, PLASMA    EKG None  Radiology US Venous Img Lower Unilateral Left (DVT)  Result Date: 01/29/2021 CLINICAL DATA:  Left leg pain and swelling, recent injury 1 week ago EXAM: LEFT LOWER EXTREMITY VENOUS DOPPLER ULTRASOUND TECHNIQUE: Gray-scale sonography with graded compression, as well as color Doppler and duplex ultrasound were performed to evaluate the lower extremity deep venous systems from the level of the common femoral vein and including the common femoral, femoral, profunda femoral, popliteal and calf veins including the  posterior tibial, peroneal and gastrocnemius veins when visible. The superficial great saphenous vein was also interrogated. Spectral Doppler was utilized to evaluate flow at rest and with distal augmentation maneuvers in the common femoral, femoral and popliteal veins. COMPARISON:  None. FINDINGS: Contralateral Common Femoral Vein: Respiratory phasicity is normal and symmetric with the symptomatic side. No evidence of thrombus. Normal compressibility. Common Femoral Vein: No evidence of thrombus. Normal compressibility, respiratory phasicity and response to augmentation. Saphenofemoral Junction: No evidence of thrombus. Normal compressibility and flow on color Doppler imaging. Profunda Femoral Vein: No evidence of thrombus. Normal compressibility and flow on color Doppler imaging. Femoral Vein: No evidence of thrombus. Normal compressibility, respiratory phasicity and response to augmentation. Popliteal Vein: No evidence of thrombus. Normal compressibility, respiratory phasicity and response to augmentation. Calf Veins: No evidence of thrombus. Normal compressibility and flow on color Doppler imaging. IMPRESSION: No evidence of deep venous thrombosis. Electronically Signed   By: M.  Shick M.D.   On: 01/29/2021 10:58    Procedures Procedures   Medications Ordered in ED Medications  vancomycin (VANCOREADY) IVPB 2000 mg/400 mL (has no administration in time range)    ED Course  I have reviewed the triage vital signs and the nursing notes.  Pertinent labs & imaging results that were available during my care of the patient were reviewed by me and considered in my medical decision making (see chart for details).    MDM Rules/Calculators/A&P                          This patient complains of left calf pain, this involves an extensive number of treatment options, and is a complaint that carries with it a high risk of complications and morbidity.    Differential Dx Cellulitis, developing abscess, DVT,  FB  Pertinent Labs I ordered, reviewed, and interpreted labs, which included WBC 11.3, otherwise labs are normal.  Medications I ordered medication vanc for cellulitis failing outpatient treatment.  Sources Previous records obtained and reviewed recent ED workup on 5/5.   Critical Interventions  none  Consultants Dr. Gardner- Who is   appreciated for admitting.  Plan Admit      Final Clinical Impression(s) / ED Diagnoses Final diagnoses:  Cellulitis of left lower extremity    Rx / DC Orders ED Discharge Orders    None       Browning, Robert, PA-C 01/29/21 2251    Zackowski, Scott, MD 01/31/21 1724  

## 2021-01-29 NOTE — Progress Notes (Signed)
A consult was received from an ED physician for Vancomycin per pharmacy dosing.  The patient's profile has been reviewed for ht/wt/allergies/indication/available labs.    A one time order has been placed for Vancomycin 2gm IV.    Further antibiotics/pharmacy consults should be ordered by admitting physician if indicated.                       Thank you, Everette Rank, PharmD 01/29/2021  10:16 PM

## 2021-01-29 NOTE — ED Provider Notes (Signed)
Emergency Medicine Provider Triage Evaluation Note  Gregory Jacobs , a 50 y.o. male  was evaluated in triage.  Pt complains of left lower leg infection. 01/21/21. [place] today negative for DVT. Sent by ortho for IV abx after suture removal today  Review of Systems  Positive: Left lower leg swelling, pain, redness Negative: Fever, SHOB  Physical Exam  BP (!) 147/93 (BP Location: Left Arm)   Pulse 88   Temp 98.2 F (36.8 C) (Oral)   Resp 18   SpO2 96%  Gen:   Awake, no distress   Resp:  Normal effort  MSK:   Moves extremities without difficulty  Other:  Swelling, erythema to left lower leg  Medical Decision Making  Medically screening exam initiated at 5:09 PM.  Appropriate orders placed.  Orel Cooler was informed that the remainder of the evaluation will be completed by another provider, this initial triage assessment does not replace that evaluation, and the importance of remaining in the ED until their evaluation is complete.     Tacy Learn, PA-C 01/29/21 1712    Daleen Bo, MD 01/29/21 [phone]

## 2021-01-29 NOTE — H&P (Signed)
History and Physical    Gregory Jacobs MGQ:[id number] DOB: 1971-06-26 DOA: 01/29/2021  PCP: Unk Pinto, MD  Patient coming from: Home  I have personally briefly reviewed patient's old medical records in [place]  Chief Complaint: Cellulitis  HPI: Gregory Jacobs is a 50 y.o. male with medical history significant of DM2, HTN.  Pt had trauma to L lower leg (impaled on metal rod) on 5/5.  Has been on Keflex, then doxycyline for cellulitis which has progressively worsened since onset after injury.  Seen at [facility] office today for suture removal, they sent him in to ED for IV ABx due to worsening cellultis, failed outpt PO therapy.  While at [facility]: they performed US and ruled out DVT in that leg.  No fevers, chills, CP.  ED Course: WBC 11k.  Given dose of vancomycin.   Review of Systems: As per HPI, otherwise all review of systems negative.  Past Medical History:  Diagnosis Date  . 2-vessel coronary artery disease 11/20/2020  . Acute respiratory failure with hypoxia ([place])   . Allergy   . Chronic kidney disease   . COVID-19   . Diabetes mellitus without complication ([place]) 2671  . Fatty liver   . Hyperlipidemia   . Hypertension 2012  . Pancreatitis   . Pneumonia due to COVID-19 virus 04/24/2020  . Prostatitis 02/08/2018    Past Surgical History:  Procedure Laterality Date  . CHOLECYSTECTOMY, LAPAROSCOPIC  2011  . VASECTOMY  2016     reports that he quit smoking about 7 years ago. His smoking use included cigarettes. He started smoking about 26 years ago. He smoked 0.50 packs per day. His smokeless tobacco use includes chew. He reports current alcohol use. He reports that he does not use drugs.  No Known Allergies  Family History  Problem Relation Age of Onset  . Breast cancer Mother   . Hypertension Father   . Heart attack Father 80  . Suicidality Sister 13  . Breast cancer Maternal Aunt 60  . Diabetes Maternal Aunt   . Diabetes Paternal  Uncle      Prior to Admission medications   Medication Sig Start Date End Date Taking? Authorizing Provider  albuterol (VENTOLIN HFA) 108 (90 Base) MCG/ACT inhaler Inhale 2 puffs into the lungs every 4 (four) hours as needed for wheezing or shortness of breath. Patient not taking: Reported on 01/18/2021 04/28/20   Jonetta Osgood, MD  APPLE CIDER VINEGAR PO Take 2 capsules by mouth daily.     [provider]  Armodafinil 150 MG tablet Take 1 tablet (150 mg total) by mouth daily. 08/04/20   Garnet Sierras, NP  ascorbic acid (VITAMIN C) 500 MG tablet Take 500-1,000 mg by mouth daily.    [provider]  aspirin EC 81 MG tablet Take 81 mg by mouth in the morning. Swallow whole.    [provider]  Blood Glucose Monitoring Suppl (CONTOUR NEXT MONITOR) w/Device KIT Check blood sugar 3 times daily-E11.9. 05/04/20   Unk Pinto, MD  cephALEXin (KEFLEX) 500 MG capsule Take 1 capsule (500 mg total) by mouth 4 (four) times daily. 01/21/21   Sherwood Gambler, MD  Cholecalciferol (D3-50) 1.25 MG (50000 UT) capsule Take 1 tablet once weekly for Severe Vitamin D Deficiency 11/24/20   Liane Comber, NP  Cinnamon 500 MG capsule Take 500 mg by mouth 2 (two) times daily.     [provider]  Cyanocobalamin (VITAMIN B-12) 5000 MCG SUBL Place 5,000 mcg under  the tongue daily. Patient not taking: Reported on 01/18/2021    [provider]  fexofenadine (ALLEGRA) 180 MG tablet TAKE 1 TABLET BY MOUTH DAILY FOR ALLERGIES 12/10/20   Liane Comber, NP  glucose blood (CONTOUR NEXT TEST) test strip Check blood sugar 3 times daily-E11.9 05/05/20   Unk Pinto, MD  Lancets Micro Thin 33G MISC Check blood sugar 3 times daily-DX-E11.9 05/05/20   Unk Pinto, MD  lisinopril (ZESTRIL) 5 MG tablet TAKE 1 TABLET DAILY FOR BLOOD PRESSURE 11/23/20   Liane Comber, NP  metFORMIN (GLUCOPHAGE-XR) 500 MG 24 hr tablet TAKE 2 TABLETS BY MOUTH TWICE DAILY WITH MEALS FOR DIABETES.  01/27/21   Liane Comber, NP  traZODone (DESYREL) 100 MG tablet TAKE 1 TABLET BY MOUTH EVERYDAY AT BEDTIME 01/06/21   Liane Comber, NP  VITAMIN E PO Take 1 capsule by mouth daily.    [provider]  zinc gluconate 50 MG tablet Take by mouth daily.    [provider]    Physical Exam: Vitals:   01/29/21 1701 01/29/21 1814 01/29/21 2211  BP: (!) 147/93 (!) 139/91 (!) 147/86  Pulse: 88 85 72  Resp: 18 18 16   Temp: 98.2 F (36.8 C)    TempSrc: Oral    SpO2: 96% 95% 98%    Constitutional: NAD, calm, comfortable Eyes: PERRL, lids and conjunctivae normal ENMT: Mucous membranes are moist. Posterior pharynx clear of any exudate or lesions.Normal dentition.  Neck: normal, supple, no masses, no thyromegaly Respiratory: clear to auscultation bilaterally, no wheezing, no crackles. Normal respiratory effort. No accessory muscle use.  Cardiovascular: Regular rate and rhythm, no murmurs / rubs / gallops. No extremity edema. 2+ pedal pulses. No carotid bruits.  Abdomen: no tenderness, no masses palpated. No hepatosplenomegaly. Bowel sounds positive.  Musculoskeletal: no clubbing / cyanosis. No joint deformity upper and lower extremities. Good ROM, no contractures. Normal muscle tone.  Skin: ~5cm induration and erythema surrounding a 1cm wound / scab on posterior aspect of LLE. Neurologic: CN 2-12 grossly intact. Sensation intact, DTR normal. Strength 5/5 in all 4.  Psychiatric: Normal judgment and insight. Alert and oriented x 3. Normal mood.    Labs on Admission: I have personally reviewed following labs and imaging studies  CBC: Recent Labs  Lab 01/29/21 1820  WBC 11.3*  NEUTROABS 6.6  HGB 15.8  HCT 47.0  MCV 87.9  PLT 208   Basic Metabolic Panel: Recent Labs  Lab 01/29/21 1820  NA 138  K 4.0  CL 105  CO2 25  GLUCOSE 98  BUN 21*  CREATININE 1.02  CALCIUM 9.6   GFR: Estimated Creatinine Clearance: 97.9 mL/min (by C-G formula based on SCr of 1.02  mg/dL). Liver Function Tests: Recent Labs  Lab 01/29/21 1820  AST 21  ALT 25  ALKPHOS 55  BILITOT 0.6  PROT 7.5  ALBUMIN 4.3   No results for input(s): LIPASE, AMYLASE in the last 168 hours. No results for input(s): AMMONIA in the last 168 hours. Coagulation Profile: No results for input(s): INR, PROTIME in the last 168 hours. Cardiac Enzymes: No results for input(s): CKTOTAL, CKMB, CKMBINDEX, TROPONINI in the last 168 hours. BNP (last 3 results) No results for input(s): PROBNP in the last 8760 hours. HbA1C: No results for input(s): HGBA1C in the last 72 hours. CBG: No results for input(s): GLUCAP in the last 168 hours. Lipid Profile: No results for input(s): CHOL, HDL, LDLCALC, TRIG, CHOLHDL, LDLDIRECT in the last 72 hours. Thyroid Function Tests: No results for input(s):  TSH, T4TOTAL, FREET4, T3FREE, THYROIDAB in the last 72 hours. Anemia Panel: No results for input(s): VITAMINB12, FOLATE, FERRITIN, TIBC, IRON, RETICCTPCT in the last 72 hours. Urine analysis:    Component Value Date/Time   COLORURINE YELLOW 08/04/2020 1618   APPEARANCEUR CLEAR 08/04/2020 1618   LABSPEC 1.012 08/04/2020 1618   PHURINE 5.5 08/04/2020 1618   GLUCOSEU NEGATIVE 08/04/2020 1618   HGBUR NEGATIVE 08/04/2020 1618   BILIRUBINUR NEGATIVE 01/19/2018 0846   KETONESUR NEGATIVE 08/04/2020 1618   PROTEINUR NEGATIVE 08/04/2020 1618   NITRITE POSITIVE (A) 01/19/2018 0846   LEUKOCYTESUR LARGE (A) 01/19/2018 0846    Radiological Exams on Admission: US Venous Img Lower Unilateral Left (DVT)  Result Date: 01/29/2021 CLINICAL DATA:  Left leg pain and swelling, recent injury 1 week ago EXAM: LEFT LOWER EXTREMITY VENOUS DOPPLER ULTRASOUND TECHNIQUE: Gray-scale sonography with graded compression, as well as color Doppler and duplex ultrasound were performed to evaluate the lower extremity deep venous systems from the level of the common femoral vein and including the common femoral, femoral, profunda  femoral, popliteal and calf veins including the posterior tibial, peroneal and gastrocnemius veins when visible. The superficial great saphenous vein was also interrogated. Spectral Doppler was utilized to evaluate flow at rest and with distal augmentation maneuvers in the common femoral, femoral and popliteal veins. COMPARISON:  None. FINDINGS: Contralateral Common Femoral Vein: Respiratory phasicity is normal and symmetric with the symptomatic side. No evidence of thrombus. Normal compressibility. Common Femoral Vein: No evidence of thrombus. Normal compressibility, respiratory phasicity and response to augmentation. Saphenofemoral Junction: No evidence of thrombus. Normal compressibility and flow on color Doppler imaging. Profunda Femoral Vein: No evidence of thrombus. Normal compressibility and flow on color Doppler imaging. Femoral Vein: No evidence of thrombus. Normal compressibility, respiratory phasicity and response to augmentation. Popliteal Vein: No evidence of thrombus. Normal compressibility, respiratory phasicity and response to augmentation. Calf Veins: No evidence of thrombus. Normal compressibility and flow on color Doppler imaging. IMPRESSION: No evidence of deep venous thrombosis. Electronically Signed   By: Jerilynn Mages.  Shick M.D.   On: 01/29/2021 10:58    EKG: Independently reviewed.  Assessment/Plan Principal Problem:   Cellulitis of left leg Active Problems:   Essential hypertension   Type 2 diabetes mellitus with kidney complication ([place])    1. Cellulitis of L leg - 1. Failed outpt ABx Keflex + Doxy 2. Trauma induced so not candidate for dalbavancin 3. Cellulitis pathway 4. Rocephin 5. Got 1 dose of vanc in ED 6. Check MRSA PCR nares (if positive cont vanc) 7. Check wound and BCx 2. HTN - 1. Cont home BP meds 3. DM2 - 1. Cont home metformin  DVT prophylaxis: Lovenox Code Status: Full Family Communication: No family in room Disposition Plan: Home after cellulitis  improved Consults called: None Admission status: Admit to inpatient  Severity of Illness: The appropriate patient status for this patient is INPATIENT. Inpatient status is judged to be reasonable and necessary in order to provide the required intensity of service to ensure the patient's safety. The patient's presenting symptoms, physical exam findings, and initial radiographic and laboratory data in the context of their chronic comorbidities is felt to place them at high risk for further clinical deterioration. Furthermore, it is not anticipated that the patient will be medically stable for discharge from the hospital within 2 midnights of admission. The following factors support the patient status of inpatient.   IP status due to failed outpatient ABx, need for IV ABx.   * I certify  that at the point of admission it is my clinical judgment that the patient will require inpatient hospital care spanning beyond 2 midnights from the point of admission due to high intensity of service, high risk for further deterioration and high frequency of surveillance required.*    Esmee Fallaw M. DO [facility]  How to contact the [facility] Attending or Consulting provider [place] or covering provider during after hours [place], for this patient?  1. Check the care team in [facility] and look for a) attending/consulting [facility] provider listed and b) the [facility] team listed 2. Log into [email or web address]  Amion Physician Scheduling and messaging for groups and whole hospitals  On call and physician scheduling software for group practices, residents, hospitalists and other medical providers for call, clinic, rotation and shift schedules. On[employer] is a hospital-wide system for scheduling doctors and paging doctors on call. EasyPlot is for scientific plotting and data analysis.  [email or web address]  and use [place]'s universal password to access. If you do not have the password, please contact the hospital operator.   3. Locate the [facility] provider you are looking for under [facility] and page to a number that you can be directly reached. 4. If you still have difficulty reaching the provider, please page the [facility] (Director on Call) for the Hospitalists listed on amion for assistance.  01/29/2021, 11:26 PM

## 2021-01-29 NOTE — ED Triage Notes (Signed)
Sent by [facility] for IV abx for possible cellulitis. Patient was stabbed w/ metal rod at work 1  Week ago, was given PO abx which did not work. Had [place] this morning and was negative for clots.

## 2021-01-30 DIAGNOSIS — L03116 Cellulitis of left lower limb: Secondary | ICD-10-CM

## 2021-01-30 DIAGNOSIS — E785 Hyperlipidemia, unspecified: Secondary | ICD-10-CM

## 2021-01-30 DIAGNOSIS — E1169 Type 2 diabetes mellitus with other specified complication: Secondary | ICD-10-CM

## 2021-01-30 LAB — MRSA PCR SCREENING: MRSA by PCR: NEGATIVE

## 2021-01-30 LAB — BASIC METABOLIC PANEL
Anion gap: 7 (ref 5–15)
BUN: 19 mg/dL (ref 6–20)
CO2: 27 mmol/L (ref 22–32)
Calcium: 9.1 mg/dL (ref 8.9–10.3)
Chloride: 106 mmol/L (ref 98–111)
Creatinine, Ser: 0.98 mg/dL (ref 0.61–1.24)
GFR, Estimated: >60 mL/min (ref >60)
Glucose, Bld: 104 mg/dL — ABNORMAL HIGH (ref 70–99)
Potassium: 4 mmol/L (ref 3.5–5.1)
Sodium: 140 mmol/L (ref 135–145)

## 2021-01-30 LAB — GLUCOSE, CAPILLARY
Glucose-Capillary: 108 mg/dL — ABNORMAL HIGH (ref 70–99)
Glucose-Capillary: 110 mg/dL — ABNORMAL HIGH (ref 70–99)
Glucose-Capillary: 142 mg/dL — ABNORMAL HIGH (ref 70–99)
Glucose-Capillary: 163 mg/dL — ABNORMAL HIGH (ref 70–99)
Glucose-Capillary: 86 mg/dL (ref 70–99)

## 2021-01-30 LAB — CBC
HCT: 44.9 % (ref 39.0–52.0)
Hemoglobin: 15.1 g/dL (ref 13.0–17.0)
MCH: 29.7 pg (ref 26.0–34.0)
MCHC: 33.6 g/dL (ref 30.0–36.0)
MCV: 88.2 fL (ref 80.0–100.0)
Platelets: 255 10*3/uL (ref 150–400)
RBC: 5.09 MIL/uL (ref 4.22–5.81)
RDW: 11.9 % (ref 11.5–15.5)
WBC: 10.8 10*3/uL — ABNORMAL HIGH (ref 4.0–10.5)
nRBC: 0 % (ref 0.0–0.2)

## 2021-01-30 LAB — RESP PANEL BY RT-PCR (FLU A&B, COVID) ARPGX2
Influenza A by PCR: NEGATIVE
Influenza B by PCR: NEGATIVE
SARS Coronavirus 2 by RT PCR: NEGATIVE

## 2021-01-30 MED ORDER — MODAFINIL 200 MG PO TABS: 200.0000 mg | ORAL_TABLET | Freq: Every day | ORAL | Status: DC | PRN

## 2021-01-30 MED ORDER — INSULIN ASPART 100 UNIT/ML IJ SOLN
0.0000 [IU] | Freq: Three times a day (TID) | INTRAMUSCULAR | Status: DC
  Administered 2021-01-30: 2 [IU] via SUBCUTANEOUS
  Administered 2021-01-31 (×3): 1 [IU] via SUBCUTANEOUS

## 2021-01-30 NOTE — Progress Notes (Signed)
PROGRESS NOTE    Gregory Jacobs  HYW:[id number] DOB: 16-Apr-1971 DOA: 01/29/2021 PCP: Unk Pinto, MD (Confirm with patient/family/NH records and if not entered, this HAS to be entered at [facility] point of entry. "No PCP" if truly none.)   Chief Complaint  Patient presents with  . Leg Swelling    Brief Narrative:  HPI per Dr. Lendon Jacobs is a 50 y.o. male with medical history significant of DM2, HTN.  Pt had trauma to L lower leg (impaled on metal rod) on 5/5.  Has been on Keflex, then doxycyline for cellulitis which has progressively worsened since onset after injury.  Seen at [facility] office today for suture removal, they sent him in to ED for IV ABx due to worsening cellultis, failed outpt PO therapy.  While at [facility]: they performed US and ruled out DVT in that leg.  No fevers, chills, CP.  ED Course: WBC 11k.  Given dose of vancomycin.  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 -Patient noted to have failed outpatient antibiotic treatment with Keflex and doxycycline. -It is noted that patient cellulitis trauma induced and as such deemed not a candidate for dalbavancin. -Blood cultures obtained and pending. -Wound cultures obtained and pending. -MRSA PCR negative. -Patient with no significant drainage noted on examination. -Continue IV Rocephin. -Supportive care. -PT/OT.  2.  Hypertension -Continue lisinopril.  Follow.  3.  Well-controlled diabetes mellitus type 2 -Hemoglobin A1c 5.5 (11/23/2020). -Discontinue metformin. -SSI.  4.  Hyperlipidemia -Continue statin.    DVT prophylaxis:  Lovenox Code Status: Full Family Communication: Updated patient.  No family at bedside. Disposition:   Status is: Inpatient    Dispo: The patient is from: Home              Anticipated d/c is to: Home              Patient currently on IV antibiotics, not stable for discharge   Difficult to place patient no       Consultants:   None  Procedures:   None  Antimicrobials:   IV vancomycin 5/13/2022x1 dose  IV Rocephin 01/29/2021>>>>>>   Subjective: Patient laying in bed with complaints of left lower extremity pain to the point inability to bear weight on it.  States some improving erythema.  No chest pain.  No shortness of breath.  Objective: Vitals:   01/29/21 2300 01/30/21 0014 01/30/21 0654 01/30/21 1402  BP: 139/83 (!) 142/92 132/82 134/85  Pulse: 60 64  68  Resp: 17 18 18 18   Temp: 97.8 F (36.6 C) (!) 97.5 F (36.4 C) 98 F (36.7 C) 98 F (36.7 C)  TempSrc: Oral Oral Oral Oral  SpO2: 96% 100% 100% 100%    Intake/Output Summary (Last 24 hours) at 01/30/2021 1558 Last data filed at 01/30/2021 1400 Gross per 24 hour  Intake 1440 ml  Output 2750 ml  Net -1310 ml   There were no vitals filed for this visit.  Examination:  General exam: Appears calm and comfortable  Respiratory system: Clear to auscultation. Respiratory effort normal. Cardiovascular system: S1 & S2 heard, RRR. No JVD, murmurs, rubs, gallops or clicks. No pedal edema.  Gastrointestinal system: Abdomen is nondistended, soft and nontender. No organomegaly or masses felt. Normal bowel sounds heard. Central nervous system: Alert and oriented. No focal neurological deficits. Extremities: Left lateral lower extremity with 5 cm area of induration and erythematous area surrounding a 1 cm wound/scab on posterior aspect of left lower extremity. Skin: No rashes, lesions or ulcers Psychiatry: Judgement and insight appear normal. Mood & affect appropriate.     Data Reviewed: I have personally  reviewed following labs and imaging studies  CBC: Recent Labs  Lab 01/29/21 1820 01/30/21 0541  WBC 11.3* 10.8*  NEUTROABS 6.6  --   HGB 15.8 15.1  HCT 47.0 44.9  MCV 87.9 88.2  PLT 260 [id number]    Basic Metabolic Panel: Recent Labs  Lab 01/29/21 1820 01/30/21 0541  NA 138 140  K 4.0 4.0  CL 105 106  CO2 25 27  GLUCOSE 98 104*  BUN 21* 19  CREATININE 1.02 0.98  CALCIUM 9.6 9.1    GFR: Estimated Creatinine Clearance: 101.9 mL/min (by C-G formula based on SCr of 0.98 mg/dL).  Liver Function Tests: Recent Labs  Lab 01/29/21 1820  AST 21  ALT 25  ALKPHOS 55  BILITOT 0.6  PROT 7.5  ALBUMIN 4.3    CBG: Recent Labs  Lab 01/30/21 0018 01/30/21 0739 01/30/21 1157  GLUCAP 86 108* 163*     Recent Results (from the past 240 hour(s))  Culture, blood (single)     Status: None (Preliminary result)   Collection Time: 01/29/21  6:21 PM   Specimen: BLOOD  Result Value Ref Range Status   Specimen Description   Final    BLOOD LEFT ANTECUBITAL Performed at [facility], [place] [address] [place], [place] [address]    Special Requests   Final    BOTTLES DRAWN AEROBIC AND ANAEROBIC Blood Culture adequate volume Performed at [facility] [address], [place], [place] [address]    Culture   Final    NO GROWTH < 24 HOURS Performed at [place] [address] [place], [place] [address]    Report Status PENDING  Incomplete  Resp Panel by RT-PCR (Flu A&B, Covid) Nasopharyngeal Swab     Status: None   Collection Time: 01/29/21 10:40 PM   Specimen: Nasopharyngeal Swab; Nasopharyngeal(NP) swabs in vial transport medium  Result Value Ref Range Status   SARS Coronavirus 2 by RT PCR NEGATIVE NEGATIVE Final    Comment: (NOTE) SARS-CoV-2 target nucleic acids are NOT DETECTED.  The SARS-CoV-2 RNA is generally detectable in upper respiratory specimens during the acute phase of infection. The lowest concentration of SARS-CoV-2  viral copies this assay can detect is 138 copies/mL. A negative result does not preclude SARS-Cov-2 infection and should not be used as the sole basis for treatment or other patient management decisions. A negative result may occur with  improper specimen collection/handling, submission of specimen other than nasopharyngeal swab, presence of viral mutation(s) within the areas targeted by this assay, and inadequate number of viral copies(<138 copies/mL). A negative result must be combined with clinical observations, patient history, and epidemiological information. The expected result is Negative.  Fact Sheet for Patients:  EntrepreneurPulse.com.au  Fact Sheet for Healthcare Providers:  IncredibleEmployment.be  This test is no t yet approved or cleared by the [place] FDA and  has been authorized for detection and/or diagnosis of SARS-CoV-2 by FDA under an Emergency Use Authorization (EUA). This EUA will remain  in effect (meaning this test  can be used) for the duration of the COVID-19 declaration under Section 564(b)(1) of the Act, 21 U.S.C.section 360bbb-3(b)(1), unless the authorization is terminated  or revoked sooner.       Influenza A by PCR NEGATIVE NEGATIVE Final   Influenza B by PCR NEGATIVE NEGATIVE Final    Comment: (NOTE) The Xpert Xpress SARS-CoV-2/FLU/RSV plus assay is intended as an aid in the diagnosis of influenza from Nasopharyngeal swab specimens and should not be used as a sole basis for treatment. Nasal washings and aspirates are unacceptable for Xpert Xpress SARS-CoV-2/FLU/RSV testing.  Fact Sheet for Patients: EntrepreneurPulse.com.au  Fact Sheet for Healthcare Providers: IncredibleEmployment.be  This test is not yet approved or cleared by the [place] FDA and has been authorized for detection and/or diagnosis of SARS-CoV-2 by FDA under an Emergency Use Authorization  (EUA). This EUA will remain in effect (meaning this test can be used) for the duration of the COVID-19 declaration under Section 564(b)(1) of the Act, 21 U.S.C. section 360bbb-3(b)(1), unless the authorization is terminated or revoked.  Performed at [facility], [place] [address] [place], [place] [address]   MRSA PCR Screening     Status: None   Collection Time: 01/29/21 10:52 PM   Specimen: Nasal Mucosa; Nasopharyngeal  Result Value Ref Range Status   MRSA by PCR NEGATIVE NEGATIVE Final    Comment:        The GeneXpert MRSA Assay (FDA approved for NASAL specimens only), is one component of a comprehensive MRSA colonization surveillance program. It is not intended to diagnose MRSA infection nor to guide or monitor treatment for MRSA infections. Performed at [facility], [place] [address], [place], [place] [address]          Radiology Studies: US Venous Img Lower Unilateral Left (DVT)  Result Date: 01/29/2021 CLINICAL DATA:  Left leg pain and swelling, recent injury 1 week ago EXAM: LEFT LOWER EXTREMITY VENOUS DOPPLER ULTRASOUND TECHNIQUE: Gray-scale sonography with graded compression, as well as color Doppler and duplex ultrasound were performed to evaluate the lower extremity deep venous systems from the level of the common femoral vein and including the common femoral, femoral, profunda femoral, popliteal and calf veins including the posterior tibial, peroneal and gastrocnemius veins when visible. The superficial great saphenous vein was also interrogated. Spectral Doppler was utilized to evaluate flow at rest and with distal augmentation maneuvers in the common femoral, femoral and popliteal veins. COMPARISON:  None. FINDINGS: Contralateral Common Femoral Vein: Respiratory phasicity is normal and symmetric with the symptomatic side. No evidence of thrombus. Normal compressibility. Common Femoral Vein: No evidence of thrombus. Normal  compressibility, respiratory phasicity and response to augmentation. Saphenofemoral Junction: No evidence of thrombus. Normal compressibility and flow on color Doppler imaging. Profunda Femoral Vein: No evidence of thrombus. Normal compressibility and flow on color Doppler imaging. Femoral Vein: No evidence of thrombus. Normal compressibility, respiratory phasicity and response to augmentation. Popliteal Vein: No evidence of thrombus. Normal compressibility, respiratory phasicity and response to augmentation. Calf Veins: No evidence of thrombus. Normal compressibility and flow on color Doppler imaging. IMPRESSION: No evidence of deep venous thrombosis. Electronically Signed   By: Jerilynn Mages.  Shick M.D.   On: 01/29/2021 10:58        Scheduled Meds: . aspirin EC  81 mg Oral Daily  . enoxaparin (LOVENOX) injection  40 mg Subcutaneous Q24H  . insulin aspart  0-9 Units Subcutaneous TID WC  . lisinopril  5 mg Oral Daily  . [START ON 02/01/2021]  rosuvastatin  5 mg Oral Q M,W,F   Continuous Infusions: . cefTRIAXone (ROCEPHIN)  IV 1 g (01/30/21 0227)     LOS: 1 day    Time spent: 35 mins    Irine Seal, MD [facility]   To contact the attending provider between 7A-7P or the covering provider during after hours 7P-7A, please log into the web site [email or web address] and access using universal [place] password for that web site. If you do not have the password, please call the hospital operator.  01/30/2021, 3:58 PM

## 2021-01-31 LAB — AEROBIC CULTURE W GRAM STAIN (SUPERFICIAL SPECIMEN): Gram Stain: NONE SEEN

## 2021-01-31 LAB — CBC WITH DIFFERENTIAL/PLATELET
Abs Immature Granulocytes: 0.04 10*3/uL (ref 0.00–0.07)
Basophils Absolute: 0.1 10*3/uL (ref 0.0–0.1)
Basophils Relative: 1 %
Eosinophils Absolute: 0.4 10*3/uL (ref 0.0–0.5)
Eosinophils Relative: 4 %
HCT: 46.5 % (ref 39.0–52.0)
Hemoglobin: 15.5 g/dL (ref 13.0–17.0)
Immature Granulocytes: 0 %
Lymphocytes Relative: 33 %
Lymphs Abs: 3.2 10*3/uL (ref 0.7–4.0)
MCH: 29.5 pg (ref 26.0–34.0)
MCHC: 33.3 g/dL (ref 30.0–36.0)
MCV: 88.6 fL (ref 80.0–100.0)
Monocytes Absolute: 0.6 10*3/uL (ref 0.1–1.0)
Monocytes Relative: 6 %
Neutro Abs: 5.4 10*3/uL (ref 1.7–7.7)
Neutrophils Relative %: 56 %
Platelets: 252 10*3/uL (ref 150–400)
RBC: 5.25 MIL/uL (ref 4.22–5.81)
RDW: 11.9 % (ref 11.5–15.5)
WBC: 9.7 10*3/uL (ref 4.0–10.5)
nRBC: 0 % (ref 0.0–0.2)

## 2021-01-31 LAB — BASIC METABOLIC PANEL
Anion gap: 7 (ref 5–15)
BUN: 23 mg/dL — ABNORMAL HIGH (ref 6–20)
CO2: 25 mmol/L (ref 22–32)
Calcium: 9.1 mg/dL (ref 8.9–10.3)
Chloride: 108 mmol/L (ref 98–111)
Creatinine, Ser: 1.02 mg/dL (ref 0.61–1.24)
GFR, Estimated: >60 mL/min (ref >60)
Glucose, Bld: 123 mg/dL — ABNORMAL HIGH (ref 70–99)
Potassium: 4.1 mmol/L (ref 3.5–5.1)
Sodium: 140 mmol/L (ref 135–145)

## 2021-01-31 LAB — GLUCOSE, CAPILLARY
Glucose-Capillary: 125 mg/dL — ABNORMAL HIGH (ref 70–99)
Glucose-Capillary: 131 mg/dL — ABNORMAL HIGH (ref 70–99)
Glucose-Capillary: 129 mg/dL — ABNORMAL HIGH (ref 70–99)

## 2021-01-31 LAB — MAGNESIUM: Magnesium: 2.2 mg/dL (ref 1.7–2.4)

## 2021-01-31 MED ORDER — SODIUM CHLORIDE 0.9 % IV BOLUS
1000.0000 mL | Freq: Once | INTRAVENOUS | Status: AC
  Administered 2021-01-31: 1000 mL via INTRAVENOUS

## 2021-01-31 MED ORDER — SODIUM CHLORIDE 0.9 % IV SOLN
1.0000 g | INTRAVENOUS | Status: DC
  Administered 2021-01-31: 1 g via INTRAVENOUS
  Filled 2021-01-31: qty 1

## 2021-01-31 MED ORDER — KETOROLAC TROMETHAMINE 30 MG/ML IJ SOLN
30.0000 mg | Freq: Once | INTRAMUSCULAR | Status: AC
  Administered 2021-01-31: 30 mg via INTRAVENOUS
  Filled 2021-01-31: qty 1

## 2021-01-31 MED ORDER — ACETAMINOPHEN 325 MG PO TABS: 650.0000 mg | ORAL_TABLET | Freq: Four times a day (QID) | ORAL | Status: DC | PRN

## 2021-01-31 MED ORDER — CEFDINIR 300 MG PO CAPS: 300.0000 mg | ORAL_CAPSULE | Freq: Two times a day (BID) | ORAL | 0 refills | Status: AC

## 2021-01-31 NOTE — Progress Notes (Signed)
Orthopedic Tech Progress Note Patient Details:  Gregory Jacobs 1971/01/03 [id number]  Ortho Devices Type of Ortho Device: CAM walker Ortho Device/Splint Location: LLE Ortho Device/Splint Interventions: Ordered   Post Interventions Patient Tolerated: Well Instructions Provided: Adjustment of device   [name] 01/31/2021, 1:32 PM

## 2021-01-31 NOTE — Progress Notes (Signed)
Assessment unchanged. Pt and wife verbalized understanding of dc instructions including meds, follow up care and when to call the doctor. Cleansed and cover wound to lt calf prior to dc. Discharged via wc to front entrance accompanied by wife and NT.

## 2021-01-31 NOTE — Plan of Care (Signed)

## 2021-01-31 NOTE — Discharge Summary (Signed)
Physician Discharge Summary  Gregory Jacobs QIH:[id number] DOB: 04/09/1971 DOA: 01/29/2021  PCP: Unk Pinto, MD  Admit date: 01/29/2021 Discharge date: 01/31/2021  Time spent: 45 minutes  Recommendations for Outpatient Follow-up:  1. Follow-up with Dr. Gladstone Lighter, orthopedics as previously scheduled 02/01/2021.  On follow-up patient's pain with weightbearing on left lower extremity will need to be reassessed. 2. Follow-up with Unk Pinto, MD in 2 to 3 weeks.   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  History of present illness:  HPI per Dr. Lendon Ka is a 50 y.o. male with medical history significant of DM2, HTN.  Pt had trauma to L lower leg (impaled on metal rod) on 5/5.  Has been on Keflex, then doxycyline for cellulitis which has progressively worsened since onset after injury.  Seen at [facility] office today for suture removal, they sent him in to ED for IV ABx due to worsening cellultis, failed outpt PO therapy.  While at [facility]: they performed US and ruled out DVT in that leg.  No fevers, chills, CP.  ED Course: WBC 11k.  Given dose of vancomycin.  Hospital Course:  1 left lower extremity cellulitis -Patient admitted from [facility] office due to concern for worsening cellulitis. -Patient noted to have failed outpatient antibiotic treatment with Keflex and doxycycline. -It is noted that patient cellulitis trauma induced and as such deemed not a candidate for dalbavancin. -Blood cultures obtained with no growth to date x2 days during the hospitalization and pending at time of  discharge.   -Wound cultures obtained with no organisms seen and pending by day of discharge.   -MRSA PCR negative. -Patient with no significant drainage noted on examination. -Patient maintained on IV Rocephin during the hospitalization and patient improved clinically.   -Patient noted still with some pain when trying to bear weight on left lower extremity.  -Patient seen by physical therapy who recommended rolling walker.   -Case discussed with Dr. Alvan Dame who recommended placement in CAM boot, dose of IV Toradol x1 which was given prior to discharge, close outpatient follow-up with orthopedics.   -Patient improved clinically patient be discharged home on 4 more days of oral Omnicef and will follow-up in the outpatient setting with Dr. Gladstone Lighter, orthopedics as previously scheduled on 02/01/2021.  -Patient with discharge with a CAM boot, rolling walker.  2.  Hypertension -Remained controlled on home regimen lisinopril.   3.  Well-controlled diabetes mellitus type 2 -Hemoglobin A1c 5.5 (11/23/2020). -Patient's oral hypoglycemic medications were held during the hospitalization and patient's blood glucose was controlled with SSI.   -Oral hypoglycemic agents will be resumed on discharge.    4.  Hyperlipidemia -Patient maintained on home regimen statin.    Procedures:  None  Consultations:  None  Discharge Exam: Vitals:   01/31/21 0600 01/31/21 1340  BP: 116/85 (!) 141/83  Pulse: 62 75  Resp: 17   Temp: 98.3 F (36.8 C) 97.8 F (36.6 C)  SpO2: 95% 96%    General: NAD Cardiovascular: RRR Respiratory: CTAB Ext: No clubbing cyanosis or edema.  Left lower extremity with improved erythema, improved induration, less tender to palpation on posterior aspect.  Discharge  Instructions   Discharge Instructions    Diet Carb Modified   Complete by: As directed    Increase activity slowly   Complete by: As directed      Allergies as of 01/31/2021   No Known Allergies      Medication List    TAKE these medications   acetaminophen 325 MG tablet Commonly known as: TYLENOL Take 2 tablets (650 mg total) by mouth every 6 (six) hours as needed for mild pain (or Fever >/= 101).   albuterol 108 (90 Base) MCG/ACT inhaler Commonly known as: VENTOLIN HFA Inhale 2 puffs into the lungs every 4 (four) hours as needed for wheezing or shortness of breath.   APPLE CIDER VINEGAR PO Take 1 capsule by mouth in the morning and at bedtime.   Armodafinil 150 MG tablet Take 1 tablet (150 mg total) by mouth daily. What changed:   when to take this  reasons to take this   ascorbic acid 500 MG tablet Commonly known as: VITAMIN C Take 500 mg by mouth daily.   aspirin EC 81 MG tablet Take 81 mg by mouth in the morning. Swallow whole.   cefdinir 300 MG capsule Commonly known as: OMNICEF Take 1 capsule (300 mg total) by mouth 2 (two) times daily for 4 days. Start taking on: Feb 01, 2021   Cinnamon 500 MG capsule Take 500 mg by mouth 2 (two) times daily.   Contour Next Monitor w/Device Kit Check blood sugar 3 times daily-E11.9.   Contour Next Test test strip Generic drug: glucose blood Check blood sugar 3 times daily-E11.9   D3-50 1.25 MG (50000 UT) capsule Generic drug: Cholecalciferol Take 1 tablet once weekly for Severe Vitamin D Deficiency What changed:   how much to take  how to take this  when to take this  additional instructions   fexofenadine 180 MG tablet Commonly known as: ALLEGRA TAKE 1 TABLET BY MOUTH DAILY FOR ALLERGIES What changed:   how much to take  how to take this  when to take this  additional instructions   Lancets Micro Thin 33G Misc Check blood sugar 3 times daily-DX-E11.9   lisinopril 5 MG tablet Commonly known as: ZESTRIL TAKE 1 TABLET DAILY FOR BLOOD PRESSURE   metFORMIN 500 MG 24 hr tablet Commonly known as: GLUCOPHAGE-XR TAKE 2 TABLETS BY MOUTH TWICE DAILY WITH MEALS FOR DIABETES. What changed:   how much  to take  how to take this  when to take this  additional instructions   rosuvastatin 5 MG tablet Commonly known as: CRESTOR Take 5 mg by mouth See admin instructions. Monday, wednesday and friday   traZODone 100 MG tablet Commonly known as: DESYREL TAKE 1 TABLET BY MOUTH EVERYDAY AT BEDTIME What changed: See the new instructions.   VITAMIN E PO Take 1 capsule by mouth daily.   zinc gluconate 50 MG tablet Take 50 mg by mouth daily.            Durable Medical Equipment  (From admission, onward)         Start     Ordered   01/31/21 1605  For home use only DME Walker rolling  Once       Question Answer Comment  Walker: With 5 Inch Wheels   Patient needs a walker to treat with the following condition Pain aggravated by physical activity      01/31/21 1604         No Known Allergies  Follow-up Information  Latanya Maudlin, MD Follow up.   Specialty: Orthopedic Surgery Why: f/u as scheduled Contact information: [address] [place] [address] [phone]        Unk Pinto, MD. Schedule an appointment as soon as possible for a visit in 2 week(s).   Specialty: Internal Medicine Why: f/u 2-3 weeks. Contact information: [address] [place] [address] [phone]                The results of significant diagnostics from this hospitalization (including imaging, microbiology, ancillary and laboratory) are listed below for reference.    Significant Diagnostic Studies: CT ANGIO LOW EXTREM LEFT W &/OR WO CONTRAST  Result Date: 01/21/2021 CLINICAL DATA:  Laceration to the left lower leg. Evaluate for arterial injury. History of diabetes, hypertension and chronic kidney disease. EXAM: CT ANGIOGRAPHY OF ABDOMINAL AORTA WITH ILIOFEMORAL RUNOFF TECHNIQUE: Multidetector CT imaging of the abdomen, pelvis and lower extremities was performed using the standard protocol during bolus administration of intravenous  contrast. Multiplanar CT image reconstructions and MIPs were obtained to evaluate the vascular anatomy. CONTRAST:  125m OMNIPAQUE IOHEXOL 350 MG/ML SOLN COMPARISON:  Chest CT-10/15/2020 FINDINGS: VASCULAR Aorta: There is a moderate amount of mixed calcified and noncalcified atherosclerotic plaque within the imaged portions of the infrarenal abdominal aorta. Celiac: Not imaged SMA: Not imaged Renals: Not imaged IMA: Widely patent. RIGHT Lower Extremity Inflow: There is a minimal amount of mixed calcified and noncalcified atherosclerotic plaque involving the right common iliac artery, not resulting in hemodynamically significant stenosis. The right internal iliac artery was mildly disease though patent and of normal caliber. The right external iliac artery is of normal caliber and widely patent without hemodynamically significant narrowing. Outflow: The right common femoral artery is widely patent without hemodynamically significant narrowing. The right deep and superficial femoral arteries are widely patent without hemodynamically significant narrowing. The right above and below-knee popliteal arteries are of normal caliber and widely patent without hemodynamically significant narrowing. Runoff: Three vessel runoff to the right lower leg with expected tapering of the peroneal artery at the level of the distal calf. The right-sided dorsalis pedis artery is patent to the level of the forefoot. No discrete lumen filling defects to suggest distal embolism. LEFT Lower Extremity Inflow: There is a moderate amount of eccentric slightly irregular predominantly noncalcified atherosclerotic plaque involving the left common iliac artery, not resulting in hemodynamically significant stenosis. The left common iliac artery appears mildly ectatic measuring 1.5 cm in diameter (image 36, series 4). The left internal iliac artery is mildly disease though patent and of normal caliber. The left external iliac artery is of normal  caliber and widely patent without hemodynamically significant narrowing. Outflow: The left common femoral artery is widely patent without hemodynamically significant narrowing. The left deep and superficial femoral arteries are widely patent without a hemodynamically significant narrowing. Note is made of asymmetrically reduced/sluggish flow to the left lower extremity with decreased opacification beginning at the mid aspect of the left superficial femoral artery and distally. The left above knee popliteal artery is of normal caliber and widely patent without hemodynamically significant narrowing however there is suboptimal opacification of the left below-knee popliteal artery. Runoff: Not evaluated secondary to presumed basal spasm in the setting of known laceration. Veins: The IVC and pelvic venous systems appear widely patent on this arterial phase examination. Review of the MIP images confirms the above findings. _________________________________________________________ Evaluation of abdominal organs is limited to the arterial phase of enhancement. NON-VASCULAR Hepatobiliary: Not imaged  Pancreas: Not imaged Spleen: Not imaged Adrenals/Urinary Tract: The inferior poles of the imaged bilateral kidneys appear normal. No definitive evidence of urinary obstruction. The bilateral adrenal glands were not imaged. Normal appearance of the urinary bladder given degree of distention. Stomach/Bowel: Moderate colonic stool burden without evidence of enteric obstruction, though note, the superior aspect of the abdomen was not imaged. Lymphatic: No bulky retroperitoneal, mesenteric, pelvic or inguinal lymphadenopathy or. Reproductive: Dystrophic calcifications within. No free normal sized fluid the pelvic cul-de-sac. Prostate gland Other: Small mesenteric fat containing bilateral inguinal hernias. Musculoskeletal: There is a punctate (approximately 3 mm) radiopaque foreign body involving the subcutaneous tissues of the  posterior aspect of the left lower leg (image 484, series 4), with minimal amount of adjacent subcutaneous stranding and scattered foci of subcutaneous emphysema compatible with provided history of recent laceration. No associated fracture. IMPRESSION: VASCULAR 1. Asymmetrically reduced arterial flow beginning at the level of the mid aspect of the left superficial femoral artery with nondiagnostic evaluation of the left tibial runoff presumably secondary to vasospasm in the setting of provided history of lower leg laceration. No sizable hematoma at site of the reported laceration. 2. Widely patent three-vessel runoff to the right lower leg and foot. The right-sided dorsalis pedis artery is patent to the level the forefoot. No evidence of distal embolism. 3. Moderate amount of atherosclerotic plaque within the abdominal aorta and bilateral common iliac arteries, left greater than right. Aortic Atherosclerosis (ICD10-I70.0). NON-VASCULAR 1. Punctate (3 mm) radiopaque foreign body imbedded within the subcutaneous tissues of posterior aspect of the left lower leg with minimal amount of adjacent subcutaneous stranding and scattered foci of subcutaneous emphysema, compatible with provided history of laceration. No associated fracture or sizable hematoma. Electronically Signed   By: Sandi Mariscal M.D.   On: 01/21/2021 08:49   CT ANGIO LOW EXTREM RIGHT W &/OR WO CONTRAST  Result Date: 01/21/2021 CLINICAL DATA:  Laceration to the left lower leg. Evaluate for arterial injury. History of diabetes, hypertension and chronic kidney disease. EXAM: CT ANGIOGRAPHY OF ABDOMINAL AORTA WITH ILIOFEMORAL RUNOFF TECHNIQUE: Multidetector CT imaging of the abdomen, pelvis and lower extremities was performed using the standard protocol during bolus administration of intravenous contrast. Multiplanar CT image reconstructions and MIPs were obtained to evaluate the vascular anatomy. CONTRAST:  173m OMNIPAQUE IOHEXOL 350 MG/ML SOLN COMPARISON:   Chest CT-10/15/2020 FINDINGS: VASCULAR Aorta: There is a moderate amount of mixed calcified and noncalcified atherosclerotic plaque within the imaged portions of the infrarenal abdominal aorta. Celiac: Not imaged SMA: Not imaged Renals: Not imaged IMA: Widely patent. RIGHT Lower Extremity Inflow: There is a minimal amount of mixed calcified and noncalcified atherosclerotic plaque involving the right common iliac artery, not resulting in hemodynamically significant stenosis. The right internal iliac artery was mildly disease though patent and of normal caliber. The right external iliac artery is of normal caliber and widely patent without hemodynamically significant narrowing. Outflow: The right common femoral artery is widely patent without hemodynamically significant narrowing. The right deep and superficial femoral arteries are widely patent without hemodynamically significant narrowing. The right above and below-knee popliteal arteries are of normal caliber and widely patent without hemodynamically significant narrowing. Runoff: Three vessel runoff to the right lower leg with expected tapering of the peroneal artery at the level of the distal calf. The right-sided dorsalis pedis artery is patent to the level of the forefoot. No discrete lumen filling defects to suggest distal embolism. LEFT Lower Extremity Inflow: There is a moderate amount of eccentric slightly irregular  predominantly noncalcified atherosclerotic plaque involving the left common iliac artery, not resulting in hemodynamically significant stenosis. The left common iliac artery appears mildly ectatic measuring 1.5 cm in diameter (image 36, series 4). The left internal iliac artery is mildly disease though patent and of normal caliber. The left external iliac artery is of normal caliber and widely patent without hemodynamically significant narrowing. Outflow: The left common femoral artery is widely patent without hemodynamically significant  narrowing. The left deep and superficial femoral arteries are widely patent without a hemodynamically significant narrowing. Note is made of asymmetrically reduced/sluggish flow to the left lower extremity with decreased opacification beginning at the mid aspect of the left superficial femoral artery and distally. The left above knee popliteal artery is of normal caliber and widely patent without hemodynamically significant narrowing however there is suboptimal opacification of the left below-knee popliteal artery. Runoff: Not evaluated secondary to presumed basal spasm in the setting of known laceration. Veins: The IVC and pelvic venous systems appear widely patent on this arterial phase examination. Review of the MIP images confirms the above findings. _________________________________________________________ Evaluation of abdominal organs is limited to the arterial phase of enhancement. NON-VASCULAR Hepatobiliary: Not imaged Pancreas: Not imaged Spleen: Not imaged Adrenals/Urinary Tract: The inferior poles of the imaged bilateral kidneys appear normal. No definitive evidence of urinary obstruction. The bilateral adrenal glands were not imaged. Normal appearance of the urinary bladder given degree of distention. Stomach/Bowel: Moderate colonic stool burden without evidence of enteric obstruction, though note, the superior aspect of the abdomen was not imaged. Lymphatic: No bulky retroperitoneal, mesenteric, pelvic or inguinal lymphadenopathy or. Reproductive: Dystrophic calcifications within. No free normal sized fluid the pelvic cul-de-sac. Prostate gland Other: Small mesenteric fat containing bilateral inguinal hernias. Musculoskeletal: There is a punctate (approximately 3 mm) radiopaque foreign body involving the subcutaneous tissues of the posterior aspect of the left lower leg (image 484, series 4), with minimal amount of adjacent subcutaneous stranding and scattered foci of subcutaneous emphysema compatible  with provided history of recent laceration. No associated fracture. IMPRESSION: VASCULAR 1. Asymmetrically reduced arterial flow beginning at the level of the mid aspect of the left superficial femoral artery with nondiagnostic evaluation of the left tibial runoff presumably secondary to vasospasm in the setting of provided history of lower leg laceration. No sizable hematoma at site of the reported laceration. 2. Widely patent three-vessel runoff to the right lower leg and foot. The right-sided dorsalis pedis artery is patent to the level the forefoot. No evidence of distal embolism. 3. Moderate amount of atherosclerotic plaque within the abdominal aorta and bilateral common iliac arteries, left greater than right. Aortic Atherosclerosis (ICD10-I70.0). NON-VASCULAR 1. Punctate (3 mm) radiopaque foreign body imbedded within the subcutaneous tissues of posterior aspect of the left lower leg with minimal amount of adjacent subcutaneous stranding and scattered foci of subcutaneous emphysema, compatible with provided history of laceration. No associated fracture or sizable hematoma. Electronically Signed   By: Sandi Mariscal M.D.   On: 01/21/2021 08:49   US Venous Img Lower Unilateral Left (DVT)  Result Date: 01/29/2021 CLINICAL DATA:  Left leg pain and swelling, recent injury 1 week ago EXAM: LEFT LOWER EXTREMITY VENOUS DOPPLER ULTRASOUND TECHNIQUE: Gray-scale sonography with graded compression, as well as color Doppler and duplex ultrasound were performed to evaluate the lower extremity deep venous systems from the level of the common femoral vein and including the common femoral, femoral, profunda femoral, popliteal and calf veins including the posterior tibial, peroneal and gastrocnemius veins when visible. The  superficial great saphenous vein was also interrogated. Spectral Doppler was utilized to evaluate flow at rest and with distal augmentation maneuvers in the common femoral, femoral and popliteal veins.  COMPARISON:  None. FINDINGS: Contralateral Common Femoral Vein: Respiratory phasicity is normal and symmetric with the symptomatic side. No evidence of thrombus. Normal compressibility. Common Femoral Vein: No evidence of thrombus. Normal compressibility, respiratory phasicity and response to augmentation. Saphenofemoral Junction: No evidence of thrombus. Normal compressibility and flow on color Doppler imaging. Profunda Femoral Vein: No evidence of thrombus. Normal compressibility and flow on color Doppler imaging. Femoral Vein: No evidence of thrombus. Normal compressibility, respiratory phasicity and response to augmentation. Popliteal Vein: No evidence of thrombus. Normal compressibility, respiratory phasicity and response to augmentation. Calf Veins: No evidence of thrombus. Normal compressibility and flow on color Doppler imaging. IMPRESSION: No evidence of deep venous thrombosis. Electronically Signed   By: Jerilynn Mages.  Shick M.D.   On: 01/29/2021 10:58    Microbiology: Recent Results (from the past 240 hour(s))  Culture, blood (single)     Status: None (Preliminary result)   Collection Time: 01/29/21  6:21 PM   Specimen: BLOOD  Result Value Ref Range Status   Specimen Description   Final    BLOOD LEFT ANTECUBITAL Performed at [facility] [address], [place], [place] [address]    Special Requests   Final    BOTTLES DRAWN AEROBIC AND ANAEROBIC Blood Culture adequate volume Performed at [facility] [address], [place], [place] [address]    Culture   Final    NO GROWTH 2 DAYS Performed at [facility] [address] [place], [place] [address]    Report Status PENDING  Incomplete  Resp Panel by RT-PCR (Flu A&B, Covid) Nasopharyngeal Swab     Status: None   Collection Time: 01/29/21 10:40 PM   Specimen: Nasopharyngeal Swab; Nasopharyngeal(NP) swabs in vial transport medium  Result Value Ref Range Status   SARS Coronavirus 2 by RT PCR  NEGATIVE NEGATIVE Final    Comment: (NOTE) SARS-CoV-2 target nucleic acids are NOT DETECTED.  The SARS-CoV-2 RNA is generally detectable in upper respiratory specimens during the acute phase of infection. The lowest concentration of SARS-CoV-2 viral copies this assay can detect is 138 copies/mL. A negative result does not preclude SARS-Cov-2 infection and should not be used as the sole basis for treatment or other patient management decisions. A negative result may occur with  improper specimen collection/handling, submission of specimen other than nasopharyngeal swab, presence of viral mutation(s) within the areas targeted by this assay, and inadequate number of viral copies(<138 copies/mL). A negative result must be combined with clinical observations, patient history, and epidemiological information. The expected result is Negative.  Fact Sheet for Patients:  EntrepreneurPulse.com.au  Fact Sheet for Healthcare Providers:  IncredibleEmployment.be  This test is no t yet approved or cleared by the [place] FDA and  has been authorized for detection and/or diagnosis of SARS-CoV-2 by FDA under an Emergency Use Authorization (EUA). This EUA will remain  in effect (meaning this test can be used) for the duration of the COVID-19 declaration under Section 564(b)(1) of the Act, 21 U.S.C.section 360bbb-3(b)(1), unless the authorization is terminated  or revoked sooner.       Influenza A by PCR NEGATIVE NEGATIVE Final   Influenza B by PCR NEGATIVE NEGATIVE Final    Comment: (NOTE) The Xpert Xpress SARS-CoV-2/FLU/RSV plus assay is intended as an aid in the diagnosis of influenza from  Nasopharyngeal swab specimens and should not be used as a sole basis for treatment. Nasal washings and aspirates are unacceptable for Xpert Xpress SARS-CoV-2/FLU/RSV testing.  Fact Sheet for Patients: EntrepreneurPulse.com.au  Fact Sheet for  Healthcare Providers: IncredibleEmployment.be  This test is not yet approved or cleared by the [place] FDA and has been authorized for detection and/or diagnosis of SARS-CoV-2 by FDA under an Emergency Use Authorization (EUA). This EUA will remain in effect (meaning this test can be used) for the duration of the COVID-19 declaration under Section 564(b)(1) of the Act, 21 U.S.C. section 360bbb-3(b)(1), unless the authorization is terminated or revoked.  Performed at [facility], [place] [address] [place], [place] [address]   MRSA PCR Screening     Status: None   Collection Time: 01/29/21 10:52 PM   Specimen: Nasal Mucosa; Nasopharyngeal  Result Value Ref Range Status   MRSA by PCR NEGATIVE NEGATIVE Final    Comment:        The GeneXpert MRSA Assay (FDA approved for NASAL specimens only), is one component of a comprehensive MRSA colonization surveillance program. It is not intended to diagnose MRSA infection nor to guide or monitor treatment for MRSA infections. Performed at [facility] [facility], [place] [address] [place], [place] [address]   Aerobic Culture w Gram Stain (superficial specimen)     Status: None (Preliminary result)   Collection Time: 01/29/21 10:54 PM   Specimen: Leg; Wound  Result Value Ref Range Status   Specimen Description   Final    LEG Performed at [facility] [address] [place], [place] [address]    Special Requests   Final    NONE Performed at [facility], [place] [address], [place], [place] [address]    Gram Stain NO WBC SEEN NO ORGANISMS SEEN   Final   Culture   Final    CULTURE REINCUBATED FOR BETTER GROWTH Performed at [facility], [place] [address] [place], [place] [address]    Report Status PENDING  Incomplete     Labs: Basic Metabolic Panel: Recent Labs  Lab 01/29/21 1820 01/30/21 0541 01/31/21 0355  NA 138 140 140  K 4.0 4.0 4.1   CL 105 106 108  CO2 25 27 25   GLUCOSE 98 104* 123*  BUN 21* 19 23*  CREATININE 1.02 0.98 1.02  CALCIUM 9.6 9.1 9.1  MG  --   --  2.2   Liver Function Tests: Recent Labs  Lab 01/29/21 1820  AST 21  ALT 25  ALKPHOS 55  BILITOT 0.6  PROT 7.5  ALBUMIN 4.3   No results for input(s): LIPASE, AMYLASE in the last 168 hours. No results for input(s): AMMONIA in the last 168 hours. CBC: Recent Labs  Lab 01/29/21 1820 01/30/21 0541 01/31/21 0355  WBC 11.3* 10.8* 9.7  NEUTROABS 6.6  --  5.4  HGB 15.8 15.1 15.5  HCT 47.0 44.9 46.5  MCV 87.9 88.2 88.6  PLT 260 255 252   Cardiac Enzymes: No results for input(s): CKTOTAL, CKMB, CKMBINDEX, TROPONINI in the last 168 hours. BNP: BNP (last 3 results) No results for input(s): BNP in the last 8760 hours.  ProBNP (last 3 results) No results for input(s): PROBNP in the last 8760 hours.  CBG: Recent Labs  Lab 01/30/21 1701 01/30/21 2105 01/31/21 0729 01/31/21 1108 01/31/21 1557  GLUCAP 110* 142* 125* 131* 129*       Signed:  Irine Seal MD.  [facility]  01/31/2021, 4:38 PM

## 2021-01-31 NOTE — Progress Notes (Addendum)
Pt has been D/C today. Received referral to assist with a tall RW. Met with pt and wife. Worker's comp pt. 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 Case worker is Elmyra Ricks [phone]. He reports that she doesn't know that he was admitted.Contacted Nicole and left a VM regarding the DME referral. Contacted [facility] for DME referral and made them aware that pt has Worker's comp.

## 2021-01-31 NOTE — Evaluation (Signed)
Physical Therapy Evaluation Patient Details Name: Gregory Jacobs MRN: [id number] DOB: [birth year] Today's Date: 01/31/2021   History of Present Illness  Pt had trauma to L lower leg (impaled on metal rod) on 5/5.  Has been on Keflex, then doxycyline for cellulitis which has progressively worsened since onset after injury.     Seen at [facility] office for suture  removal, they sent him in to ED for IV ABx due to worsening cellultis, failed outpt PO therapy.  Clinical Impression  Pt admitted with above diagnosis.  Reviewed safe amb and goals of gait with RW. Reviewed ankle pumps/L ankle ROM, ankle circles or alphabet ankle exercises, reviewed seated soleus stretch. Will follow up again as schedule allows to review HEP. Educated pt on reason for discomfort with A/PROM and dependent positioning LLE  Pt currently with functional limitations due to the deficits listed below (see PT Problem List). Pt will benefit from skilled PT to increase their independence and safety with mobility to allow discharge to the venue listed below.       Follow Up Recommendations No PT follow up    Equipment Recommendations  Rolling walker with 5" wheels    Recommendations for Other Services       Precautions / Restrictions Precautions Precautions: None Restrictions Weight Bearing Restrictions: No      Mobility  Bed Mobility Overal bed mobility: Independent                  Transfers Overall transfer level: Modified independent                  Ambulation/Gait Ambulation/Gait assistance: Supervision Gait Distance (Feet): 140 Feet Assistive device: Rolling walker (2 wheeled) Gait Pattern/deviations: Step-to pattern     General Gait Details: pt educated on incr WBing and allowing L heel to touch floor as tolerated. educated on reaons this is difficult as well as gait progression and avoiding compensatory techniques (LHip external rotation and decr knee/ankle movment)  Stairs             Wheelchair Mobility    Modified Rankin (Stroke Patients Only)       Balance Overall balance assessment: Mild deficits observed, not formally tested                                           Pertinent Vitals/Pain Pain Assessment: 0-10 Pain Score: 8  Pain Location: L lower leg with supination foot and df Pain Descriptors / Indicators: Discomfort;Grimacing;Sharp;Throbbing;Tightness;Guarding Pain Intervention(s): Limited activity within patient's tolerance;Monitored during session;Repositioned    Home Living Family/patient expects to be discharged to:: Private residence Living Arrangements: Children;Spouse/significant other Available Help at Discharge: Family Type of Home: House Home Access: Stairs to enter   Technical brewer of Steps: 2 Home Layout: One level Home Equipment: None Additional Comments: has an old set of crutches    Prior Function Level of Independence: Independent         Comments: works at [employer]        Extremity/Trunk Assessment   Upper Extremity Assessment Upper Extremity Assessment: Overall WFL for tasks assessed    Lower Extremity Assessment Lower Extremity Assessment: LLE deficits/detail LLE Deficits / Details: hip and knee WFL; ankle limited supination/inversion, df limited to ~ 6* AROM. ankle pf WFL; ankle df 2+/5-ltd by pain       Communication  Communication: No difficulties  Cognition Arousal/Alertness: Awake/alert Behavior During Therapy: WFL for tasks assessed/performed Overall Cognitive Status: Within Functional Limits for tasks assessed                                        General Comments General comments (skin integrity, edema, etc.): mild edema and erythema posterior lateral mid L calf with visible eschar    Exercises     Assessment/Plan    PT Assessment Patient needs continued PT services  PT Problem List Decreased mobility;Decreased range of  motion;Decreased knowledge of use of DME;Pain       PT Treatment Interventions DME instruction;Therapeutic exercise;Gait training;Functional mobility training;Therapeutic activities;Patient/family education    PT Goals (Current goals can be found in the Care Plan section)  Acute Rehab PT Goals Patient Stated Goal: get better PT Goal Formulation: With patient Time For Goal Achievement: 02/07/21 Potential to Achieve Goals: Good    Frequency Min 3X/week   Barriers to discharge        Co-evaluation               AM-PAC PT "6 Clicks" Mobility  Outcome Measure Help needed turning from your back to your side while in a flat bed without using bedrails?: None Help needed moving from lying on your back to sitting on the side of a flat bed without using bedrails?: None Help needed moving to and from a bed to a chair (including a wheelchair)?: None Help needed standing up from a chair using your arms (e.g., wheelchair or bedside chair)?: None Help needed to walk in hospital room?: A Little Help needed climbing 3-5 steps with a railing? : A Little 6 Click Score: 22    End of Session   Activity Tolerance: Patient tolerated treatment well Patient left: in bed;with call bell/phone within reach (LLE elevated)   PT Visit Diagnosis: Other abnormalities of gait and mobility (R26.89);Pain Pain - Right/Left: Left Pain - part of body: Leg;Ankle and joints of foot    Time: 4627-0350 PT Time Calculation (min) (ACUTE ONLY): 25 min   Charges:   PT Evaluation $PT Eval Low Complexity: 1 Low PT Treatments $Gait Training: 8-22 mins        Baxter Flattery, PT  Acute Rehab Dept ([place]) [phone] Pager [phone]  01/31/2021   [facility] 01/31/2021, 1:15 PM

## 2021-02-03 LAB — CULTURE, BLOOD (SINGLE)
Culture: NO GROWTH
Special Requests: ADEQUATE

## 2021-02-05 ENCOUNTER — Ambulatory Visit (INDEPENDENT_AMBULATORY_CARE_PROVIDER_SITE_OTHER): Payer: No Typology Code available for payment source | Admitting: Internal Medicine

## 2021-02-05 DIAGNOSIS — Z23 Encounter for immunization: Secondary | ICD-10-CM

## 2021-02-05 NOTE — Patient Instructions (Signed)
Hepatitis A; Hepatitis B Vaccine injection What is this medicine? HEPATITIS A VACCINE; HEPATITIS B VACCINE (hep uh TAHY tis A vak SEEN; hep uh TAHY tis B vak SEEN) is a vaccine to protect from an infection with the hepatitis A and B virus. This vaccine does not contain the live viruses. It will not cause a hepatitis infection. This medicine may be used for other purposes; ask your health care provider or pharmacist if you have questions. COMMON BRAND NAME(S): Twinrix What should I tell my health care provider before I take this medicine? They need to know if you have any of these conditions:  bleeding disorder  fever or infection  heart disease  immune system problems  an unusual or allergic reaction to hepatitis A or B vaccine, neomycin, yeast, thimerosal, other medicines, foods, dyes, or preservatives  pregnant or trying to get pregnant  breast-feeding How should I use this medicine? This vaccine is for injection into a muscle. It is given by a health care professional. A copy of Vaccine Information Statements will be given before each vaccination. Read this sheet carefully each time. The sheet may change frequently. Talk to your pediatrician regarding the use of this medicine in children. Special care may be needed. Overdosage: If you think you have taken too much of this medicine contact a poison control center or emergency room at once. NOTE: This medicine is only for you. Do not share this medicine with others. What if I miss a dose? It is important not to miss your dose. Call your doctor or health care professional if you are unable to keep an appointment. What may interact with this medicine?  medicines that suppress your immune function like adalimumab, anakinra, infliximab  medicines to treat cancer  steroid medicines like prednisone or cortisone This list may not describe all possible interactions. Give your health care provider a list of all the medicines, herbs,  non-prescription drugs, or dietary supplements you use. Also tell them if you smoke, drink alcohol, or use illegal drugs. Some items may interact with your medicine. What should I watch for while using this medicine? See your health care provider for all shots of this vaccine as directed. You must have 3 to 4 shots of this vaccine for protection from hepatitis A and B infection. Tell your doctor right away if you have any serious or unusual side effects after getting this vaccine. What side effects may I notice from receiving this medicine? Side effects that you should report to your doctor or health care professional as soon as possible:  allergic reactions like skin rash, itching or hives, swelling of the face, lips, or tongue  breathing problems  confused, irritated  fast, irregular heartbeat  flu-like syndrome  numb, tingling pain  seizures Side effects that usually do not require medical attention (report to your doctor or health care professional if they continue or are bothersome):  diarrhea  fever  headache  loss of appetite  muscle pain  nausea  pain, redness, swelling, or irritation at site where injected  tiredness This list may not describe all possible side effects. Call your doctor for medical advice about side effects. You may report side effects to FDA at 1-800-FDA-1088. Where should I keep my medicine? This drug is given in a hospital or clinic and will not be stored at home. NOTE: This sheet is a summary. It may not cover all possible information. If you have questions about this medicine, talk to your doctor, pharmacist, or health   care provider.  2021 Elsevier/Gold Standard (2008-01-18 15:21:37)  

## 2021-02-05 NOTE — Progress Notes (Signed)
Pt presents today for nurse visit for an injection. Per Dr. Carlean Purl, injection of Twinrix given IM in L deltoid today by A. Valda Christenson, LPN. Denies any adverse reactions or discomfort.

## 2021-02-09 ENCOUNTER — Other Ambulatory Visit: Payer: Self-pay | Admitting: *Deleted

## 2021-02-09 MED ORDER — ROSUVASTATIN CALCIUM 5 MG PO TABS: 5.0000 mg | ORAL_TABLET | ORAL | 3 refills | Status: DC

## 2021-02-11 ENCOUNTER — Other Ambulatory Visit: Payer: Self-pay | Admitting: Adult Health

## 2021-02-11 DIAGNOSIS — E559 Vitamin D deficiency, unspecified: Secondary | ICD-10-CM

## 2021-03-08 ENCOUNTER — Other Ambulatory Visit: Payer: Self-pay | Admitting: Adult Health

## 2021-03-08 DIAGNOSIS — I10 Essential (primary) hypertension: Secondary | ICD-10-CM

## 2021-03-08 MED ORDER — LISINOPRIL 5 MG PO TABS: ORAL_TABLET | ORAL | 3 refills | Status: DC

## 2021-03-11 ENCOUNTER — Encounter: Payer: Self-pay | Admitting: Adult Health

## 2021-03-11 DIAGNOSIS — C44311 Basal cell carcinoma of skin of nose: Secondary | ICD-10-CM | POA: Insufficient documentation

## 2021-03-11 DIAGNOSIS — Z85828 Personal history of other malignant neoplasm of skin: Secondary | ICD-10-CM | POA: Insufficient documentation

## 2021-03-11 HISTORY — DX: Basal cell carcinoma of skin of nose: C44.311

## 2021-05-20 ENCOUNTER — Other Ambulatory Visit: Payer: Self-pay | Admitting: Internal Medicine

## 2021-05-20 ENCOUNTER — Other Ambulatory Visit: Payer: Self-pay

## 2021-05-20 MED ORDER — GLIPIZIDE 5 MG PO TABS: ORAL_TABLET | ORAL | 0 refills | Status: DC

## 2021-06-18 ENCOUNTER — Telehealth: Payer: Self-pay

## 2021-06-18 NOTE — Telephone Encounter (Signed)
-----   Message from Herberta Pickron E [place], [place] sent at 02/05/2021  1:21 PM EDT ----- Set up appointment for Oct 12th or so for #3 Twinrix, Dr CG pt

## 2021-06-18 NOTE — Telephone Encounter (Signed)
I have left Gregory Jacobs a message to call us back to set up his Hep A & B vaccine as we are not doing the Twinrix anymore. This is his #3 (final shot).

## 2021-06-24 NOTE — Telephone Encounter (Signed)
I left him another detailed message to call us back.

## 2021-06-28 NOTE — Telephone Encounter (Signed)
He has appointment set up for October 17th. Per Barb Merino, CGRN he will get a Hep A and a Hep B and this will make him compete since this was to be his Twinrix #3.

## 2021-06-30 ENCOUNTER — Other Ambulatory Visit: Payer: Self-pay | Admitting: Adult Health

## 2021-07-05 ENCOUNTER — Ambulatory Visit (INDEPENDENT_AMBULATORY_CARE_PROVIDER_SITE_OTHER): Payer: 59 | Admitting: Internal Medicine

## 2021-07-05 DIAGNOSIS — Z23 Encounter for immunization: Secondary | ICD-10-CM

## 2021-07-05 MED ORDER — HEPATITIS A VACCINE 1440 EL U/ML IM SUSP
1.0000 mL | Freq: Once | INTRAMUSCULAR | Status: AC
  Administered 2021-07-05: 1440 [IU] via INTRAMUSCULAR

## 2021-07-05 NOTE — Progress Notes (Signed)
Patient received his last Hep A vaccine today

## 2021-07-22 ENCOUNTER — Encounter: Payer: Self-pay | Admitting: Internal Medicine

## 2021-07-22 ENCOUNTER — Other Ambulatory Visit: Payer: Self-pay | Admitting: Adult Health

## 2021-07-22 DIAGNOSIS — E119 Type 2 diabetes mellitus without complications: Secondary | ICD-10-CM

## 2021-08-03 ENCOUNTER — Encounter: Payer: Self-pay | Admitting: Adult Health

## 2021-08-03 NOTE — Progress Notes (Signed)
COMPLETE PHYSICAL  Assessment and Plan:   Jacarie was seen today for annual exam.  Diagnoses and all orders for this visit:  Encounter for Annual Physical Exam with abnormal findings Due annually  Health Maintenance reviewed Healthy lifestyle reviewed and goals set Declines vaccines  Atherosclerosis of aorta ([place]) - CT 05/2020 Control blood pressure, cholesterol, glucose, increase exercise.   2 vessel CAD Discussed recommendation for cardiology evaluation vs CT coronary calcium at some point to determine degree; cannot determine this on non-cardiac imaging Denies sx; has numerous specialist appointments and CTs this year Will hold off, newly on rosuvastatin, continue ASA, aggressive lifestyle interventions He is receptive to CT coronary calcium next year Go to the ER if any chest pain, shortness of breath, nausea, dizziness, severe HA, changes vision/speech  Essential hypertension Continue low dose lisinopril; reduce to 2.5 mg if dizziness Monitor blood pressure at home; call if consistently over 130/80 Continue DASH diet.   Reminder to go to the ER if any CP, SOB, nausea, dizziness, severe HA, changes vision/speech, left arm numbness and tingling and jaw pain. -     CBC with Differential/Platelet -     COMPLETE METABOLIC PANEL WITH GFR -     TSH -     Magnesium  Hyperlipidemia associated with type 2 diabetes mellitus (HCC) LDL goal <70; now on rosuvastatin  Discussed dietary and exercise modifications Low fat diet -     Lipid panel  Type 2 diabetes mellitus with stage 2 chronic kidney disease, without long-term current use of insulin (HCC) Continue medications: Metformin 562m two tablets BID. Taper down on dose as able if <5.7% Discussed general issues about diabetes pathophysiology and management. Education: Reviewed 'ABCs' of diabetes management (respective goals in parentheses):  A1C (<7), blood pressure (<130/80), and cholesterol (LDL <70) Dietary  recommendations Encouraged aerobic exercise.  Discussed foot care, check daily Yearly retinal exam - he has scheduled, requested report Dental exam every 6 months Monitor blood glucose, discussed goal for patient -     Hemoglobin A1c  Stage 2 chronic kidney disease due to type 2 diabetes mellitus (HCC) Increase fluids, avoid NSAIDS, monitor sugars, will monitor  - CMP/GFR< UA, microalbumin   Exertional dyspnea S/P acute respiratory failure during hospitalization Has improved; did see pulm; was advised regular walking program Reviewed again today; receptive to starting.   Vitamin D deficiency Continue supplementation to maintain goal of 70-100 Reduced to taking Vitamin D 50,000IU once a week  Check vitamin D level  Overweight (BMI 25.0-29.9)  Discussed dietary and exercise modifications Encouraged healthy behaviors, continue lifestyle modifications  Medication management Continued  Screening for blood or protein in urine -     Urinalysis w microscopic + reflex cultur -     Microalbumin / creatinine urine ratio -     REFLEXIVE URINE CULTURE  Prostate nodule  -[facility] following  Screening PSA (prostate specific antigen) -     PSA - checked per patient preference  Fatigue - Testosterone, total   Shift work sleep disorder Uses armodafinil from previous provider sparingly;    Orders Placed This Encounter  Procedures   CBC with Differential/Platelet   COMPLETE METABOLIC PANEL WITH GFR   Magnesium   Lipid panel   TSH   Hemoglobin A1c   VITAMIN D 25 Hydroxy (Vit-D Deficiency, Fractures)   Microalbumin / creatinine urine ratio   Urinalysis, Routine w reflex microscopic   PSA   Testosterone   EKG 12-Lead   HM DIABETES FOOT EXAM  Further disposition pending results if labs check today. Discussed med's effects and SE's.   Over 40 minutes of face to face interview, exam, counseling, chart review, and critical decision making was performed.     Future Appointments  Date Time Provider [place]  11/12/2021 11:00 AM Liane Comber, NP [facility] None  02/11/2022 11:00 AM Liane Comber, NP [facility] None  05/13/2022 11:00 AM Liane Comber, NP [facility] None  08/04/2022 10:00 AM Liane Comber, NP [facility] None    ----------------------------------------------------------------------------------------------------------------------  HPI 50 y.o. male  presents for complete physical and follow up. He has Essential hypertension; Hyperlipidemia associated with type 2 diabetes mellitus ([place]); Type 2 diabetes mellitus with kidney complication ([place]); Vitamin D deficiency; NAFLD (nonalcoholic fatty liver disease); Overweight (BMI 25.0-29.9); Prostate nodule; Stage 2 chronic kidney disease due to type 2 diabetes mellitus ([place]); Pityriasis rosea; Abnormal EKG; Aortic atherosclerosis ([place]) - CT 05/2020; Personal history of covid-19 (04/16/2020); Hepatomegaly; Shift work sleep disorder; Abnormal liver CT-question cirrhosis; Pancreatic lesion-diminutive hypodensity stable times years; 2-vessel coronary artery disease; Metabolic syndrome; and History of basal cell carcinoma (BCC) on their problem list.  He is married, works as [employer], working third shift. He has grown son in the [employer]. Married with step kids.   Recurrent covid 19 pneumonia, had significant pneumonia, respiratory failure with admission with residual fibrosis, He did see pulmonology, had PFTs, was recommended regular exercise. Improved some with increased walking.  He had fatty liver with elevated LFTs, recent External CT abd/pelvis w & w/o contrast 09/28/2020 showed prominent caudate lob and slight lobularity of the lateral segment left hepatic lob suspicious for early cirrhosis. Dr. Carlean Purl evaluated, recommended lifestyle modification, pancreatic cyst felt stable and no dedicated follow up was recommended.   He had BCC of nose that was resected earlier this year at  [facility] dermatology.   He has been prescribed armodafinil PRN due to daytime fatigue/shift work in the past, takes rarely, reports still has nearly full bottle.   BMI is Body mass index is 27.07 kg/m., he has been working on diet and exercise. Reports has cut out soda (does diet only), doing lots of [place] wraps, eggs and fruit, some atkins bars while at work. Does sugar free flavors in water.  Wt Readings from Last 3 Encounters:  08/04/21 210 lb 12.8 oz (95.6 kg)  01/21/21 205 lb 0.4 oz (93 kg)  01/18/21 205 lb (93 kg)   CT 05/2020 showed aortic atherosclerosis, 2 vessel CAD, was initiated on rosuvastatin.   His blood pressure has been controlled at home (120's/70's etc), today their BP is BP: 112/78  He does not workout but work physically active job. He denies chest pain, shortness of breath, dizziness.   He is on cholesterol medication (rosuvastatin 5 mg three days a week) and denies myalgias. His cholesterol is not at goal. The cholesterol last visit was:   Lab Results  Component Value Date   CHOL 130 11/23/2020   HDL 40 11/23/2020   LDLCALC 71 11/23/2020   TRIG 104 11/23/2020   CHOLHDL 3.3 11/23/2020    He has been working on diet and exercise for DMII on metformin (2000 mg), and denies foot ulcerations, increased appetite, nausea, paresthesia of the feet, polydipsia, polyuria, visual disturbances, vomiting and weight loss.  He checks fasting glucose intermittently 90s-110s.  Last A1C in the office was:  Lab Results  Component Value Date   HGBA1C 5.5 11/23/2020   Lab Results  Component Value Date   GFRNONAA >60 01/31/2021   Patient is  on Vitamin D supplement, has reduced to 50000 IU once a month, down from 3/week  Lab Results  Component Value Date   VD25OH >25 (H) 11/23/2020     He follows annually with [facility] Dr. Gloriann Loan, last [place] 10/02/2020, prostate nodule (neg biopsy 2019) and bloody ejaculate. Had PSA but requesting recheck today with testosterone, having  more fatigue.  Lab Results  Component Value Date   PSA 0.42 08/04/2020   PSA 2.4 02/09/2018   PSA 20.5 (H) 01/22/2018    No results found for: TESTOSTERONE     Current Medications:  Current Outpatient Medications on File Prior to Visit  Medication Sig   APPLE CIDER VINEGAR PO Take 1 capsule by mouth in the morning and at bedtime.   aspirin EC 81 MG tablet Take 81 mg by mouth in the morning. Swallow whole.   Blood Glucose Monitoring Suppl (CONTOUR NEXT MONITOR) w/Device KIT Check blood sugar 3 times daily-E11.9.   Cetirizine HCl (ZYRTEC PO) Take by mouth daily.   Cholecalciferol (D3-50) 1.25 MG (50000 UT) capsule Take 1 tablet once weekly for Severe Vitamin D Deficiency (Patient taking differently: Take 50,000 Units by mouth every 30 (thirty) days. for Severe Vitamin D Deficiency)   Cinnamon 500 MG capsule Take 500 mg by mouth 2 (two) times daily.    glucose blood (CONTOUR NEXT TEST) test strip Check blood sugar 3 times daily-E11.9   Lancets Micro Thin 33G MISC Check blood sugar 3 times daily-DX-E11.9   lisinopril (ZESTRIL) 5 MG tablet TAKE 1 TABLET DAILY FOR BLOOD PRESSURE   metFORMIN (GLUCOPHAGE-XR) 500 MG 24 hr tablet TAKE 2 TABLETS BY MOUTH TWICE DAILY WITH MEALS FOR DIABETES.   rosuvastatin (CRESTOR) 5 MG tablet Take 1 tablet (5 mg total) by mouth See admin instructions. Monday, wednesday and friday   traZODone (DESYREL) 100 MG tablet TAKE 1 TABLET BY MOUTH EVERYDAY AT BEDTIME (Patient taking differently: Take 100 mg by mouth at bedtime as needed for sleep.)   albuterol (VENTOLIN HFA) 108 (90 Base) MCG/ACT inhaler Inhale 2 puffs into the lungs every 4 (four) hours as needed for wheezing or shortness of breath. (Patient not taking: Reported on 08/04/2021)   Armodafinil 150 MG tablet Take 1 tablet (150 mg total) by mouth daily. (Patient not taking: Reported on 08/04/2021)   No current facility-administered medications on file prior to visit.     Allergies: No Known Allergies    Medical History:  Past Medical History:  Diagnosis Date   2-vessel coronary artery disease 11/20/2020   Acute respiratory failure with hypoxia (HCC)    Allergy    Basal cell carcinoma (BCC) of skin of nose 03/11/2021   Chronic kidney disease    COVID-19    Diabetes mellitus without complication ([place]) 1829   Fatty liver    Hyperlipidemia    Hypertension 2012   Pancreatitis    Pneumonia due to COVID-19 virus 04/24/2020   Prostatitis 02/08/2018   Immunization History  Administered Date(s) Administered   Hep A / Hep B 12/28/2020, 02/05/2021   Hepatitis A, Adult 07/05/2021   Hepb-cpg 07/05/2021   Pneumococcal Polysaccharide-23 07/11/2018   Td 05/28/2013   Tdap 01/21/2021   TD or Tdap: 2014  Influenza: Declines  Pneumococcal: 2019 Prevnar13: Due at 52 Shingrix: at age 22  Covid 83: declines   Last colonoscopy: 01/18/2021, 10 year recall, Dr. Carlean Purl  Last eye: 2021, my eye doctor, has scheduled, will request report Last dental: [facility], 2022, goes q35mLast derm: 07/2021  Surgical History:  He  has a past surgical history that includes Cholecystectomy, laparoscopic (2011) and Vasectomy (2016). Family History:  Hisfamily history includes Breast cancer in his mother; Breast cancer (age of onset: 36) in his maternal aunt; Diabetes in his maternal aunt and paternal uncle; Heart attack (age of onset: 39) in his father; Hypertension in his father; Suicidality (age of onset: 14) in his sister. Social History:   reports that he quit smoking about 7 years ago. His smoking use included cigarettes. He started smoking about 26 years ago. He has a 9.50 pack-year smoking history. His smokeless tobacco use includes chew. He reports current alcohol use. He reports that he does not use drugs.   Review of Systems:  Review of Systems  Constitutional:  Negative for malaise/fatigue and weight loss.  HENT:  Negative for hearing loss and tinnitus.   Eyes:  Negative for blurred vision and  double vision.  Respiratory:  Negative for cough, sputum production, shortness of breath and wheezing.   Cardiovascular:  Negative for chest pain, palpitations, orthopnea, claudication, leg swelling and PND.  Gastrointestinal:  Negative for abdominal pain, blood in stool, constipation, diarrhea, heartburn, melena, nausea and vomiting.  Genitourinary: Negative.   Musculoskeletal:  Negative for falls, joint pain and myalgias.  Skin:  Negative for rash.  Neurological:  Negative for dizziness, tingling, sensory change, weakness and headaches.  Endo/Heme/Allergies:  Negative for polydipsia.  Psychiatric/Behavioral: Negative.  Negative for depression, memory loss, substance abuse and suicidal ideas. The patient is not nervous/anxious and does not have insomnia.   All other systems reviewed and are negative.  Physical Exam: BP 112/78   Pulse 73   Temp 97.7 F (36.5 C)   Ht 6' 2"  (1.88 m)   Wt 210 lb 12.8 oz (95.6 kg)   SpO2 99%   BMI 27.07 kg/m  Wt Readings from Last 3 Encounters:  08/04/21 210 lb 12.8 oz (95.6 kg)  01/21/21 205 lb 0.4 oz (93 kg)  01/18/21 205 lb (93 kg)   General Appearance: Well nourished adults male, well developed, in no apparent distress. Eyes: PERRLA, conjunctiva no swelling or erythema Sinuses: No Frontal/maxillary tenderness ENT/Mouth: Ext aud canals clear, TMs without erythema, bulging. Post pharynx without swelling, or exudate on post pharynx.  Tonsils not swollen or erythematous. Hearing normal.  Neck: Supple Respiratory: Respiratory effort normal, BS equal bilaterally without rales, rhonchi, wheezing or stridor.  Cardio: RRR with no MRGs. Brisk peripheral pulses without edema.  Abdomen: Soft, + BS.  Non tender, no rebound, no palpable organomegaly.  Lymphatics: Non tender without lymphadenopathy.  Musculoskeletal: Full ROM, 5/5 strength, Normal gait Skin: Warm, dry, no lesions, ecchymosis, rash.  Neuro: Cranial nerves intact. No cerebellar symptoms. Distal  sensation intact by monofilament  Psych: Awake and oriented X 3, normal affect, Insight and Judgment appropriate.  GU: defer to urology   EKG: sinus bradycardia  Izora Ribas, NP 12:26 PM [facility]

## 2021-08-04 ENCOUNTER — Ambulatory Visit (INDEPENDENT_AMBULATORY_CARE_PROVIDER_SITE_OTHER): Payer: 59 | Admitting: Adult Health

## 2021-08-04 ENCOUNTER — Encounter: Payer: Self-pay | Admitting: Adult Health

## 2021-08-04 ENCOUNTER — Encounter: Payer: Managed Care, Other (non HMO) | Admitting: Adult Health Nurse Practitioner

## 2021-08-04 ENCOUNTER — Other Ambulatory Visit: Payer: Self-pay

## 2021-08-04 VITALS — BP 112/78 | HR 73 | Temp 97.7°F | Ht 74.0 in | Wt 210.8 lb

## 2021-08-04 DIAGNOSIS — I10 Essential (primary) hypertension: Secondary | ICD-10-CM | POA: Diagnosis not present

## 2021-08-04 DIAGNOSIS — Z1389 Encounter for screening for other disorder: Secondary | ICD-10-CM

## 2021-08-04 DIAGNOSIS — E663 Overweight: Secondary | ICD-10-CM

## 2021-08-04 DIAGNOSIS — R9431 Abnormal electrocardiogram [ECG] [EKG]: Secondary | ICD-10-CM

## 2021-08-04 DIAGNOSIS — E785 Hyperlipidemia, unspecified: Secondary | ICD-10-CM

## 2021-08-04 DIAGNOSIS — E291 Testicular hypofunction: Secondary | ICD-10-CM

## 2021-08-04 DIAGNOSIS — Z0000 Encounter for general adult medical examination without abnormal findings: Secondary | ICD-10-CM

## 2021-08-04 DIAGNOSIS — E559 Vitamin D deficiency, unspecified: Secondary | ICD-10-CM

## 2021-08-04 DIAGNOSIS — Z125 Encounter for screening for malignant neoplasm of prostate: Secondary | ICD-10-CM

## 2021-08-04 DIAGNOSIS — G4726 Circadian rhythm sleep disorder, shift work type: Secondary | ICD-10-CM

## 2021-08-04 DIAGNOSIS — E1169 Type 2 diabetes mellitus with other specified complication: Secondary | ICD-10-CM

## 2021-08-04 DIAGNOSIS — Z136 Encounter for screening for cardiovascular disorders: Secondary | ICD-10-CM

## 2021-08-04 DIAGNOSIS — Z85828 Personal history of other malignant neoplasm of skin: Secondary | ICD-10-CM

## 2021-08-04 DIAGNOSIS — I2510 Atherosclerotic heart disease of native coronary artery without angina pectoris: Secondary | ICD-10-CM

## 2021-08-04 DIAGNOSIS — Z0001 Encounter for general adult medical examination with abnormal findings: Secondary | ICD-10-CM

## 2021-08-04 DIAGNOSIS — Z1329 Encounter for screening for other suspected endocrine disorder: Secondary | ICD-10-CM

## 2021-08-04 DIAGNOSIS — I700 Atherosclerosis of aorta: Secondary | ICD-10-CM

## 2021-08-04 DIAGNOSIS — R160 Hepatomegaly, not elsewhere classified: Secondary | ICD-10-CM

## 2021-08-04 DIAGNOSIS — R5383 Other fatigue: Secondary | ICD-10-CM

## 2021-08-04 DIAGNOSIS — N402 Nodular prostate without lower urinary tract symptoms: Secondary | ICD-10-CM

## 2021-08-04 DIAGNOSIS — E1122 Type 2 diabetes mellitus with diabetic chronic kidney disease: Secondary | ICD-10-CM

## 2021-08-04 DIAGNOSIS — C44311 Basal cell carcinoma of skin of nose: Secondary | ICD-10-CM

## 2021-08-04 DIAGNOSIS — N182 Chronic kidney disease, stage 2 (mild): Secondary | ICD-10-CM

## 2021-08-04 NOTE — Patient Instructions (Signed)
Gregory Jacobs , Thank you for taking time to come for your Annual Wellness Visit. I appreciate your ongoing commitment to your health goals. Please review the following plan we discussed and let me know if I can assist you in the future.   These are the goals we discussed:  Goals      Exercise 150 min/wk Moderate Activity     Aim to break a sweat and get heart rate up for at least 15-20 min daily. Circuit training with weights can be helpful.      HEMOGLOBIN A1C < 5.7     LDL CALC < 70     Weight (lb) < 200 lb (90.7 kg)        This is a list of the screening recommended for you and due dates:  Health Maintenance  Topic Date Due   Eye exam for diabetics  Never done   Hemoglobin A1C  05/26/2021   COVID-19 Vaccine (1) 08/20/2021*   Zoster (Shingles) Vaccine (1 of 2) 11/04/2021*   Flu Shot  12/17/2021*   Pneumococcal Vaccination (2 - PCV) 08/04/2022*   Complete foot exam   08/04/2022   Colon Cancer Screening  01/19/2031   Tetanus Vaccine  01/22/2031   Hepatitis C Screening: USPSTF Recommendation to screen - Ages 18-79 yo.  Completed   HIV Screening  Completed   HPV Vaccine  Aged Out  *Topic was postponed. The date shown is not the original due date.      Know what a healthy weight is for you (roughly BMI <25) and aim to maintain this  Aim for 7+ servings of fruits and vegetables daily  65-80+ fluid ounces of water or unsweet tea for healthy kidneys  Limit to max 1 drink of alcohol per day; avoid smoking/tobacco  Limit animal fats in diet for cholesterol and heart health - choose grass fed whenever available  Avoid highly processed foods, and foods high in saturated/trans fats  Aim for low stress - take time to unwind and care for your mental health  Aim for 150 min of moderate intensity exercise weekly for heart health, and weights twice weekly for bone health  Aim for 7-9 hours of sleep daily   High-Fiber Eating Plan Fiber, also called dietary fiber, is a type of  carbohydrate. It is found foods such as fruits, vegetables, whole grains, and beans. A high-fiber diet can have many health benefits. Your health care provider may recommend a high-fiber diet to help: Prevent constipation. Fiber can make your bowel movements more regular. Lower your cholesterol. Relieve the following conditions: Inflammation of veins in the anus (hemorrhoids). Inflammation of specific areas of the digestive tract (uncomplicated diverticulosis). A problem of the large intestine, also called the colon, that sometimes causes pain and diarrhea (irritable bowel syndrome, or IBS). Prevent overeating as part of a weight-loss plan. Prevent heart disease, type 2 diabetes, and certain cancers. What are tips for following this plan? Reading food labels  Check the nutrition facts label on food products for the amount of dietary fiber. Choose foods that have 5 grams of fiber or more per serving. The goals for recommended daily fiber intake include: Men (age 30 or younger): 34-38 g. Men (over age 47): 28-34 g. Women (age 61 or younger): 25-28 g. Women (over age 57): 22-25 g. Your daily fiber goal is _____________ g. Shopping Choose whole fruits and vegetables instead of processed forms, such as apple juice or applesauce. Choose a wide variety of high-fiber foods such as  avocados, lentils, oats, and kidney beans. Read the nutrition facts label of the foods you choose. Be aware of foods with added fiber. These foods often have high sugar and sodium amounts per serving. Cooking Use whole-grain flour for baking and cooking. Cook with brown rice instead of white rice. Meal planning Start the day with a breakfast that is high in fiber, such as a cereal that contains 5 g of fiber or more per serving. Eat breads and cereals that are made with whole-grain flour instead of refined flour or white flour. Eat brown rice, bulgur wheat, or millet instead of white rice. Use beans in place of meat in  soups, salads, and pasta dishes. Be sure that half of the grains you eat each day are whole grains. General information You can get the recommended daily intake of dietary fiber by: Eating a variety of fruits, vegetables, grains, nuts, and beans. Taking a fiber supplement if you are not able to take in enough fiber in your diet. It is better to get fiber through food than from a supplement. Gradually increase how much fiber you consume. If you increase your intake of dietary fiber too quickly, you may have bloating, cramping, or gas. Drink plenty of water to help you digest fiber. Choose high-fiber snacks, such as berries, raw vegetables, nuts, and popcorn. What foods should I eat? Fruits Berries. Pears. Apples. Oranges. Avocado. Prunes and raisins. Dried figs. Vegetables Sweet potatoes. Spinach. Kale. Artichokes. Cabbage. Broccoli. Cauliflower. Green peas. Carrots. Squash. Grains Whole-grain breads. Multigrain cereal. Oats and oatmeal. Brown rice. Barley. Bulgur wheat. [place]. Quinoa. Bran muffins. Popcorn. Rye wafer crackers. Meats and other proteins Navy beans, kidney beans, and pinto beans. Soybeans. Split peas. Lentils. Nuts and seeds. Dairy Fiber-fortified yogurt. Beverages Fiber-fortified soy milk. Fiber-fortified orange juice. Other foods Fiber bars. The items listed above may not be a complete list of recommended foods and beverages. Contact a dietitian for more information. What foods should I avoid? Fruits Fruit juice. Cooked, strained fruit. Vegetables Fried potatoes. Canned vegetables. Well-cooked vegetables. Grains White bread. Pasta made with refined flour. White rice. Meats and other proteins Fatty cuts of meat. Fried chicken or fried fish. Dairy Milk. Yogurt. Cream cheese. Sour cream. Fats and oils Butters. Beverages Soft drinks. Other foods Cakes and pastries. The items listed above may not be a complete list of foods and beverages to avoid. Talk with your  dietitian about what choices are best for you. Summary Fiber is a type of carbohydrate. It is found in foods such as fruits, vegetables, whole grains, and beans. A high-fiber diet has many benefits. It can help to prevent constipation, lower blood cholesterol, aid weight loss, and reduce your risk of heart disease, diabetes, and certain cancers. Increase your intake of fiber gradually. Increasing fiber too quickly may cause cramping, bloating, and gas. Drink plenty of water while you increase the amount of fiber you consume. The best sources of fiber include whole fruits and vegetables, whole grains, nuts, seeds, and beans. This information is not intended to replace advice given to you by your health care provider. Make sure you discuss any questions you have with your health care provider. Document Revised: 01/09/2020 Document Reviewed: 01/09/2020 Elsevier Patient Education  2022 [employer].

## 2021-08-05 ENCOUNTER — Other Ambulatory Visit: Payer: Self-pay | Admitting: Adult Health

## 2021-08-05 LAB — URINALYSIS, ROUTINE W REFLEX MICROSCOPIC
Bilirubin Urine: NEGATIVE
Glucose, UA: NEGATIVE
Hgb urine dipstick: NEGATIVE
Ketones, ur: NEGATIVE
Leukocytes,Ua: NEGATIVE
Nitrite: NEGATIVE
Protein, ur: NEGATIVE
Specific Gravity, Urine: 1.008 (ref 1.001–1.035)
pH: 5.5 (ref 5.0–8.0)

## 2021-08-05 LAB — CBC WITH DIFFERENTIAL/PLATELET
Absolute Monocytes: 490 cells/uL (ref 200–950)
Basophils Absolute: 83 cells/uL (ref 0–200)
Basophils Relative: 1 %
Eosinophils Absolute: 191 cells/uL (ref 15–500)
Eosinophils Relative: 2.3 %
HCT: 47.7 % (ref 38.5–50.0)
Hemoglobin: 15.9 g/dL (ref 13.2–17.1)
Lymphs Abs: 3079 cells/uL (ref 850–3900)
MCH: 29.3 pg (ref 27.0–33.0)
MCHC: 33.3 g/dL (ref 32.0–36.0)
MCV: 87.8 fL (ref 80.0–100.0)
MPV: 11.3 fL (ref 7.5–12.5)
Monocytes Relative: 5.9 %
Neutro Abs: 4457 cells/uL (ref 1500–7800)
Neutrophils Relative %: 53.7 %
Platelets: 241 10*3/uL (ref 140–400)
RBC: 5.43 10*6/uL (ref 4.20–5.80)
RDW: 12.1 % (ref 11.0–15.0)
Total Lymphocyte: 37.1 %
WBC: 8.3 10*3/uL (ref 3.8–10.8)

## 2021-08-05 LAB — COMPLETE METABOLIC PANEL WITH GFR
AG Ratio: 1.8 (calc) (ref 1.0–2.5)
ALT: 23 U/L (ref 9–46)
AST: 20 U/L (ref 10–35)
Albumin: 4.5 g/dL (ref 3.6–5.1)
Alkaline phosphatase (APISO): 53 U/L (ref 35–144)
BUN: 17 mg/dL (ref 7–25)
CO2: 28 mmol/L (ref 20–32)
Calcium: 9.9 mg/dL (ref 8.6–10.3)
Chloride: 105 mmol/L (ref 98–110)
Creat: 0.93 mg/dL (ref 0.70–1.30)
Globulin: 2.5 g/dL (calc) (ref 1.9–3.7)
Glucose, Bld: 95 mg/dL (ref 65–99)
Potassium: 4.3 mmol/L (ref 3.5–5.3)
Sodium: 141 mmol/L (ref 135–146)
Total Bilirubin: 1 mg/dL (ref 0.2–1.2)
Total Protein: 7 g/dL (ref 6.1–8.1)
eGFR: 99 mL/min/{1.73_m2} (ref >=60)

## 2021-08-05 LAB — HEMOGLOBIN A1C
Hgb A1c MFr Bld: 5.6 % of total Hgb (ref <5.7)
Mean Plasma Glucose: 114 mg/dL
eAG (mmol/L): 6.3 mmol/L

## 2021-08-05 LAB — LIPID PANEL
Cholesterol: 131 mg/dL (ref <200)
HDL: 38 mg/dL — ABNORMAL LOW (ref >=40)
LDL Cholesterol (Calc): 75 mg/dL (calc)
Non-HDL Cholesterol (Calc): 93 mg/dL (calc) (ref <130)
Total CHOL/HDL Ratio: 3.4 (calc) (ref <5.0)
Triglycerides: 99 mg/dL (ref <150)

## 2021-08-05 LAB — MICROALBUMIN / CREATININE URINE RATIO
Creatinine, Urine: 44 mg/dL (ref 20–320)
Microalb, Ur: <0.2 mg/dL

## 2021-08-05 LAB — VITAMIN D 25 HYDROXY (VIT D DEFICIENCY, FRACTURES): Vit D, 25-Hydroxy: 75 ng/mL (ref 30–100)

## 2021-08-05 LAB — PSA: PSA: 0.37 ng/mL (ref <=4.00)

## 2021-08-05 LAB — TSH: TSH: 1.97 mIU/L (ref 0.40–4.50)

## 2021-08-05 LAB — TESTOSTERONE: Testosterone: 605 ng/dL (ref 250–827)

## 2021-08-05 LAB — MAGNESIUM: Magnesium: 2 mg/dL (ref 1.5–2.5)

## 2021-08-05 MED ORDER — ROSUVASTATIN CALCIUM 5 MG PO TABS: ORAL_TABLET | ORAL | 3 refills | Status: DC

## 2021-09-21 ENCOUNTER — Ambulatory Visit (INDEPENDENT_AMBULATORY_CARE_PROVIDER_SITE_OTHER): Payer: Managed Care, Other (non HMO) | Admitting: Adult Health

## 2021-09-21 ENCOUNTER — Other Ambulatory Visit: Payer: Self-pay

## 2021-09-21 VITALS — Temp 99.0°F

## 2021-09-21 DIAGNOSIS — R079 Chest pain, unspecified: Secondary | ICD-10-CM

## 2021-09-21 DIAGNOSIS — Z1152 Encounter for screening for COVID-19: Secondary | ICD-10-CM

## 2021-09-21 LAB — POC COVID19 BINAXNOW: SARS Coronavirus 2 Ag: NEGATIVE

## 2021-09-21 NOTE — Progress Notes (Signed)
Provider briefly examined patient after walk in add on after hours. He reports left upper sharp chest pain with coughing that started this afternoon. Dull aching in left shoulder/arm. Reports mild cough with productivity. No clear dyspnea. Initially with mild temp 99 F. EKG without ST changes however sinus tachy, 90s, atypical from baseline. Normal S1/S2, o JVD, clear symmetrical resp sounds. Covid 19 negative. I recommended ED evaluation, cannot fully rule out cardiac or PE etiology, pneumonia. He expresses understanding that I cannot rule this out for him at this time with after hours resourses in PCP environment. He expresses understanding, declined ED evaluation at this time. Wife is CMA here, will monitor closely at home tonight. STRICT ED PRECAUTIONS.

## 2021-09-21 NOTE — Progress Notes (Signed)
Patient presents to the office for a nurse visit. Complaining of chest pain with dull arm pain and SOB. Started at 2pm. EKG completed with findings to be normal. ER precautions given.

## 2021-09-22 ENCOUNTER — Other Ambulatory Visit: Payer: Managed Care, Other (non HMO)

## 2021-09-22 ENCOUNTER — Ambulatory Visit
Admission: RE | Admit: 2021-09-22 | Discharge: 2021-09-22 | Disposition: A | Discharge status: Home / Self Care | Payer: Managed Care, Other (non HMO) | Source: Ambulatory Visit | Attending: Adult Health | Admitting: Adult Health

## 2021-09-22 ENCOUNTER — Other Ambulatory Visit: Payer: Self-pay | Admitting: Adult Health

## 2021-09-22 DIAGNOSIS — R079 Chest pain, unspecified: Secondary | ICD-10-CM

## 2021-09-23 ENCOUNTER — Encounter: Payer: Self-pay | Admitting: Adult Health

## 2021-09-23 ENCOUNTER — Other Ambulatory Visit: Payer: Self-pay | Admitting: Adult Health

## 2021-09-23 MED ORDER — DEXAMETHASONE 1 MG PO TABS: ORAL_TABLET | ORAL | 0 refills | Status: DC

## 2021-09-27 LAB — CBC WITH DIFFERENTIAL/PLATELET
Absolute Monocytes: 813 cells/uL (ref 200–950)
Basophils Absolute: 116 cells/uL (ref 0–200)
Basophils Relative: 0.9 %
Eosinophils Absolute: 426 cells/uL (ref 15–500)
Eosinophils Relative: 3.3 %
HCT: 50.1 % — ABNORMAL HIGH (ref 38.5–50.0)
Hemoglobin: 17.1 g/dL (ref 13.2–17.1)
Lymphs Abs: 3148 cells/uL (ref 850–3900)
MCH: 30.1 pg (ref 27.0–33.0)
MCHC: 34.1 g/dL (ref 32.0–36.0)
MCV: 88 fL (ref 80.0–100.0)
MPV: 11.7 fL (ref 7.5–12.5)
Monocytes Relative: 6.3 %
Neutro Abs: 8398 cells/uL — ABNORMAL HIGH (ref 1500–7800)
Neutrophils Relative %: 65.1 %
Platelets: 247 10*3/uL (ref 140–400)
RBC: 5.69 10*6/uL (ref 4.20–5.80)
RDW: 12.3 % (ref 11.0–15.0)
Total Lymphocyte: 24.4 %
WBC: 12.9 10*3/uL — ABNORMAL HIGH (ref 3.8–10.8)

## 2021-09-27 LAB — D-DIMER, QUANTITATIVE: D-Dimer, Quant: <0.19 mcg/mL FEU (ref <0.50)

## 2021-09-27 LAB — TROPONIN T, HIGH SENSITIVITY (HS-TNT): Troponin T (Highly Sensitive): 8 ng/L (ref <23)

## 2021-09-29 ENCOUNTER — Telehealth: Payer: Self-pay

## 2021-09-29 ENCOUNTER — Other Ambulatory Visit: Payer: Self-pay | Admitting: Adult Health

## 2021-09-29 DIAGNOSIS — I2510 Atherosclerotic heart disease of native coronary artery without angina pectoris: Secondary | ICD-10-CM

## 2021-09-29 DIAGNOSIS — R0789 Other chest pain: Secondary | ICD-10-CM

## 2021-09-29 NOTE — Telephone Encounter (Signed)
Please put in referral to see Cardiology.

## 2021-10-04 ENCOUNTER — Encounter: Payer: Self-pay | Admitting: Internal Medicine

## 2021-10-12 ENCOUNTER — Encounter: Payer: Self-pay | Admitting: *Deleted

## 2021-10-12 ENCOUNTER — Encounter: Payer: Self-pay | Admitting: Internal Medicine

## 2021-10-12 ENCOUNTER — Other Ambulatory Visit: Payer: Self-pay

## 2021-10-12 ENCOUNTER — Ambulatory Visit (INDEPENDENT_AMBULATORY_CARE_PROVIDER_SITE_OTHER): Payer: Managed Care, Other (non HMO)

## 2021-10-12 ENCOUNTER — Ambulatory Visit (INDEPENDENT_AMBULATORY_CARE_PROVIDER_SITE_OTHER): Payer: Managed Care, Other (non HMO) | Admitting: Internal Medicine

## 2021-10-12 ENCOUNTER — Ambulatory Visit: Payer: Managed Care, Other (non HMO) | Admitting: Internal Medicine

## 2021-10-12 VITALS — BP 118/76 | HR 86 | Ht 74.0 in | Wt 220.8 lb

## 2021-10-12 DIAGNOSIS — E1169 Type 2 diabetes mellitus with other specified complication: Secondary | ICD-10-CM

## 2021-10-12 DIAGNOSIS — E1122 Type 2 diabetes mellitus with diabetic chronic kidney disease: Secondary | ICD-10-CM

## 2021-10-12 DIAGNOSIS — R079 Chest pain, unspecified: Secondary | ICD-10-CM

## 2021-10-12 DIAGNOSIS — N182 Chronic kidney disease, stage 2 (mild): Secondary | ICD-10-CM

## 2021-10-12 DIAGNOSIS — I2510 Atherosclerotic heart disease of native coronary artery without angina pectoris: Secondary | ICD-10-CM | POA: Diagnosis not present

## 2021-10-12 DIAGNOSIS — R002 Palpitations: Secondary | ICD-10-CM

## 2021-10-12 DIAGNOSIS — E785 Hyperlipidemia, unspecified: Secondary | ICD-10-CM

## 2021-10-12 DIAGNOSIS — I10 Essential (primary) hypertension: Secondary | ICD-10-CM

## 2021-10-12 NOTE — Progress Notes (Unsigned)
Enrolled for [employer] to mail a ZIO XT long term holter monitor to the patients address on file.  

## 2021-10-12 NOTE — Patient Instructions (Addendum)
Medication Instructions:  No changes *If you need a refill on your cardiac medications before your next appointment, please call your pharmacy*   Lab Work: none If you have labs (blood work) drawn today and your tests are completely normal, you will receive your results only by: [place] (if you have MyChart) OR A paper copy in the mail If you have any lab test that is abnormal or we need to change your treatment, we will call you to review the results.   Testing/Procedures: Zio XT Heart Monitor (Patch)  Your physician has requested that you have en exercise stress myoview. For further information please visit [email or web address]. Please follow instruction sheet, as given.   Your physician has requested that you have an echocardiogram. Echocardiography is a painless test that uses sound waves to create images of your heart. It provides your doctor with information about the size and shape of your heart and how well your hearts chambers and valves are working. This procedure takes approximately one hour. There are no restrictions for this procedure.   Follow-Up: At [facility], you and your health needs are our priority.  As part of our continuing mission to provide you with exceptional heart care, we have created designated Provider Care Teams.  These Care Teams include your primary Cardiologist (physician) and Advanced Practice Providers (APPs -  Physician Assistants and Nurse Practitioners) who all work together to provide you with the care you need, when you need it.   Your next appointment:   6 month(s)  The format for your next appointment:   In Person  Provider:   Early Osmond, MD     Other Instructions Bryn Gulling- Long Term Monitor Instructions  Your physician has requested you wear a ZIO patch monitor for 3 days.  This is a single patch monitor. [employer] supplies one patch monitor per enrollment. Additional stickers are not available. Please do not apply patch  if you will be having a Nuclear Stress Test,  Echocardiogram, Cardiac CT, MRI, or Chest Xray during the period you would be wearing the  monitor. The patch cannot be worn during these tests. You cannot remove and re-apply the  ZIO XT patch monitor.  Your ZIO patch monitor will be mailed 3 day USPS to your address on file. It may take 3-5 days  to receive your monitor after you have been enrolled.  Once you have received your monitor, please review the enclosed instructions. Your monitor  has already been registered assigning a specific monitor serial # to you.  Billing and Patient Assistance Program Information  We have supplied [employer] with any of your insurance information on file for billing purposes. [employer] offers a sliding scale Patient Assistance Program for patients that do not have  insurance, or whose insurance does not completely cover the cost of the ZIO monitor.  You must apply for the Patient Assistance Program to qualify for this discounted rate.  To apply, please call [employer] at [phone], select option 4, select option 2, ask to apply for  Patient Assistance Program. Theodore Demark will ask your household income, and how many people  are in your household. They will quote your out-of-pocket cost based on that information.  [employer] will also be able to set up a 56-month, interest-free payment plan if needed.  Applying the monitor   Shave hair from upper left chest.  Hold abrader disc by orange tab. Rub abrader in 40 strokes over the upper left chest as  indicated in your monitor  instructions.  Clean area with 4 enclosed alcohol pads. Let dry.  Apply patch as indicated in monitor instructions. Patch will be placed under collarbone on left  side of chest with arrow pointing upward.  Rub patch adhesive wings for 2 minutes. Remove white label marked "1". Remove the white  label marked "2". Rub patch adhesive wings for 2 additional minutes.  While looking in a mirror, press and  release button in center of patch. A small green light will  flash 3-4 times. This will be your only indicator that the monitor has been turned on.  Do not shower for the first 24 hours. You may shower after the first 24 hours.  Press the button if you feel a symptom. You will hear a small click. Record Date, Time and  Symptom in the Patient Logbook.  When you are ready to remove the patch, follow instructions on the last 2 pages of Patient  Logbook. Stick patch monitor onto the last page of Patient Logbook.  Place Patient Logbook in the blue and white box. Use locking tab on box and tape box closed  securely. The blue and white box has prepaid postage on it. Please place it in the mailbox as  soon as possible. Your physician should have your test results approximately 7 days after the  monitor has been mailed back to [facility].  Call [place] at [phone] if you have questions regarding  your ZIO XT patch monitor. Call them immediately if you see an orange light blinking on your  monitor.  If your monitor falls off in less than 4 days, contact our Monitor department at [phone].  If your monitor becomes loose or falls off after 4 days call [employer] at [phone] for  suggestions on securing your monitor

## 2021-10-12 NOTE — Progress Notes (Addendum)
Cardiology Office Note:    Date:  10/12/2021   ID:  [name], DOB June 07, 1971, MRN [id number]  PCP:  Unk Pinto, MD   [facility] Providers Cardiologist:  Lenna Sciara, MD Referring MD: Unk Pinto, MD   Chief Complaint/Reason for Referral: Chest pain  ASSESSMENT:    Chest pain, unspecified type  2-vessel coronary artery disease  Hyperlipidemia associated with type 2 diabetes mellitus ([place])  Type 2 diabetes mellitus with stage 2 chronic kidney disease, without long-term current use of insulin ([place])  Essential hypertension  Palpitations    PLAN:    In order of problems listed above:   Will obtain an exercise thallium stress test due to resting ST changes.  My suspicion for obstructive coronary artery disease despite his CT scan showing coronary calcium is low given the atypical nature of his symptoms.  We will follow-up in 6 months or earlier if needed.  See discussion above, continue aspirin and statin for goal LDL less than 70.  Continue aspirin, statin, and consider Jardiance.  4.  Blood pressures well controlled on his current regimen  5.  We will obtain ZIO monitor and echocardiogram.             Dispo:  No follow-ups on file.     Medication Adjustments/Labs and Tests Ordered: Current medicines are reviewed at length with the patient today.  Concerns regarding medicines are outlined above.   Tests Ordered: No orders of the defined types were placed in this encounter.   Medication Changes: No orders of the defined types were placed in this encounter.   History of Present Illness:    FOCUSED CARDIOVASCULAR PROBLEM LIST:   1.  Aortic atherosclerosis and coronary calcification of the LAD in left circumflex artery seen on CT scan 2021 2.  Hypertension 3.  Hyperlipidemia 4.  Type 2 diabetes 5.  Covid pneumonia 2021 6.  FH of CAD  The patient is a 51 y.o. male with the indicated medical history here for for chest pain.  The  patient had called his primary provider earlier this month after he developed left sharp chest pain with coughing that afternoon with some left shoulder and arm pain.  His EKG demonstrates sinus rhythm with no acute ST changes.  COVID-19 and troponin was negative.  At that point in time he did not proceed to the emergency department.   The patient tells me that he developed this chest pain and had a coughing fit with that.  The pain would only occur when he coughed.  It lasted for about a day and then went away on its own.  Since that time he has been working.  He works as a Pharmacist, community for [employer].  He walks during his daily day without any signs or symptoms of angina.  He occasionally gets short of breath and is being seen by pulmonary.  He feels like he has not yet completely recovered from his COVID-pneumonia.  He has had no lightheadedness or syncope.  He occasionally gets palpitations he tells me may be on a daily basis that feels like a pounding in his chest.  It is not associated with chest pain or shortness of breath.  He is required no hospitalizations or emergency room visits recently.        Previous Medical History: Past Medical History:  Diagnosis Date   2-vessel coronary artery disease 11/20/2020   Acute respiratory failure with hypoxia (HCC)    Allergy    Basal cell carcinoma (BCC) of  skin of nose 03/11/2021   Chronic kidney disease    COVID-19    Diabetes mellitus without complication ([place]) 0263   Fatty liver    Hyperlipidemia    Hypertension 2012   Pancreatitis    Pneumonia due to COVID-19 virus 04/24/2020   Prostatitis 02/08/2018     Current Medications: Current Meds  Medication Sig   APPLE CIDER VINEGAR PO Take 1 capsule by mouth in the morning and at bedtime.   aspirin EC 81 MG tablet Take 81 mg by mouth in the morning. Swallow whole.   Blood Glucose Monitoring Suppl (CONTOUR NEXT MONITOR) w/Device KIT Check blood sugar 3 times daily-E11.9.   Cetirizine HCl (ZYRTEC PO) Take  by mouth daily.   Cinnamon 500 MG capsule Take 500 mg by mouth 2 (two) times daily.    glucose blood (CONTOUR NEXT TEST) test strip Check blood sugar 3 times daily-E11.9   Lancets Micro Thin 33G MISC Check blood sugar 3 times daily-DX-E11.9   lisinopril (ZESTRIL) 5 MG tablet TAKE 1 TABLET DAILY FOR BLOOD PRESSURE   metFORMIN (GLUCOPHAGE-XR) 500 MG 24 hr tablet TAKE 2 TABLETS BY MOUTH TWICE DAILY WITH MEALS FOR DIABETES.   rosuvastatin (CRESTOR) 5 MG tablet Take 1 tab 4 days a week for cholesterol (MWFSat)   traZODone (DESYREL) 100 MG tablet TAKE 1 TABLET BY MOUTH EVERYDAY AT BEDTIME   [DISCONTINUED] Armodafinil 150 MG tablet Take 1 tablet (150 mg total) by mouth daily.   [DISCONTINUED] Cholecalciferol (D3-50) 1.25 MG (50000 UT) capsule Take 1 tablet once weekly for Severe Vitamin D Deficiency (Patient taking differently: Take 50,000 Units by mouth every 30 (thirty) days. for Severe Vitamin D Deficiency)   [DISCONTINUED] dexamethasone (DECADRON) 1 MG tablet Take 2 tabs for 2 days, 1 tabs for 3 days, 1/2 tab for 2 days. Take with food.     Allergies:    Patient has no known allergies.   Social History:   Social History   Tobacco Use   Smoking status: Former    Packs/day: 0.50    Years: 19.00    Pack years: 9.50    Types: Cigarettes    Start date: 1996    Quit date: 2015    Years since quitting: 8.0   Smokeless tobacco: Current    Types: Chew   Tobacco comments:    Plans to quit 2023  Vaping Use   Vaping Use: Never used  Substance Use Topics   Alcohol use: Yes    Comment: occasional, 1 every few weeks   Drug use: No     Family Hx: Family History  Problem Relation Age of Onset   Breast cancer Mother    Hypertension Father    Heart attack Father 26   Suicidality Sister 80   Breast cancer Maternal Aunt 60   Diabetes Maternal Aunt    Diabetes Paternal Uncle      Review of Systems:   Please see the history of present illness.    All other systems reviewed and are  negative.     EKGs/Labs/Other Test Reviewed:    EKG sinus rhythm with resting ST changes  Prior CV studies:  None  Imaging studies that I have independently reviewed today: CT scan  Recent Labs: 08/04/2021: ALT 23; BUN 17; Creat 0.93; Magnesium 2.0; Potassium 4.3; Sodium 141; TSH 1.97 09/22/2021: Hemoglobin 17.1; Platelets 247   Recent Lipid Panel Lab Results  Component Value Date/Time   CHOL 131 08/04/2021 11:03 AM   TRIG 99 08/04/2021 11:03  AM   HDL 38 (L) 08/04/2021 11:03 AM   LDLCALC 75 08/04/2021 11:03 AM    Risk Assessment/Calculations:          Physical Exam:    VS:  BP 118/76    Pulse 86    Ht 6' 2"  (1.88 m)    Wt 220 lb 12.8 oz (100.2 kg)    SpO2 98%    BMI 28.35 kg/m    Wt Readings from Last 3 Encounters:  10/12/21 220 lb 12.8 oz (100.2 kg)  08/04/21 210 lb 12.8 oz (95.6 kg)  01/21/21 205 lb 0.4 oz (93 kg)    GENERAL:  No apparent distress, AOx3 HEENT:  No carotid bruits, +2 carotid impulses, no scleral icterus CAR: RRR no murmurs, gallops, rubs, or thrills RES:  Clear to auscultation bilaterally ABD:  Soft, nontender, nondistended, positive bowel sounds x 4 VASC:  +2 radial pulses, +2 carotid pulses, palpable pedal pulses NEURO:  CN 2-12 grossly intact; motor and sensory grossly intact PSYCH:  No active depression or anxiety EXT:  No edema, ecchymosis, or cyanosis  Signed, Early Osmond, MD  10/12/2021 3:05 PM    [address], [place], [place]  [address] Phone: [phone]; Fax: [phone]   Note:  This document was prepared using Dragon voice recognition software and may include unintentional dictation errors.          Shared Decision Making/Informed Consent The risks [chest pain, shortness of breath, cardiac arrhythmias, dizziness, blood pressure fluctuations, myocardial infarction, stroke/transient ischemic attack, nausea, vomiting, allergic reaction, radiation exposure, metallic taste sensation and  life-threatening complications (estimated to be 1 in 10,000)], benefits (risk stratification, diagnosing coronary artery disease, treatment guidance) and alternatives of a nuclear stress test were discussed in detail with Mr. Levene and he agrees to proceed.

## 2021-10-18 DIAGNOSIS — R002 Palpitations: Secondary | ICD-10-CM | POA: Diagnosis not present

## 2021-10-21 NOTE — Addendum Note (Signed)
Addended by: Rodman Key on: 10/21/2021 01:52 PM   Modules accepted: Orders

## 2021-10-21 NOTE — Addendum Note (Signed)
Addended by: Lenna Sciara on: 10/21/2021 02:01 PM   Modules accepted: Orders

## 2021-10-27 ENCOUNTER — Encounter: Payer: Self-pay | Admitting: Internal Medicine

## 2021-10-27 NOTE — Telephone Encounter (Signed)
Called patient and reviewed his monitor results.

## 2021-11-01 ENCOUNTER — Telehealth (HOSPITAL_COMMUNITY): Payer: Self-pay | Admitting: *Deleted

## 2021-11-01 NOTE — Telephone Encounter (Signed)
Patient given detailed instructions per Myocardial Perfusion Study Information Sheet for the test on 11/08/21 at 10:15. Patient notified to arrive 15 minutes early and that it is imperative to arrive on time for appointment to keep from having the test rescheduled.  If you need to cancel or reschedule your appointment, please call the office within 24 hours of your appointment. . Patient verbalized understanding.Gregory Jacobs

## 2021-11-08 ENCOUNTER — Encounter: Payer: Self-pay | Admitting: Adult Health

## 2021-11-08 ENCOUNTER — Ambulatory Visit (HOSPITAL_BASED_OUTPATIENT_CLINIC_OR_DEPARTMENT_OTHER): Payer: Managed Care, Other (non HMO)

## 2021-11-08 ENCOUNTER — Ambulatory Visit (HOSPITAL_COMMUNITY): Payer: Managed Care, Other (non HMO) | Attending: Internal Medicine

## 2021-11-08 ENCOUNTER — Other Ambulatory Visit: Payer: Self-pay

## 2021-11-08 DIAGNOSIS — E1122 Type 2 diabetes mellitus with diabetic chronic kidney disease: Secondary | ICD-10-CM | POA: Insufficient documentation

## 2021-11-08 DIAGNOSIS — R002 Palpitations: Secondary | ICD-10-CM

## 2021-11-08 DIAGNOSIS — N182 Chronic kidney disease, stage 2 (mild): Secondary | ICD-10-CM

## 2021-11-08 DIAGNOSIS — I10 Essential (primary) hypertension: Secondary | ICD-10-CM | POA: Insufficient documentation

## 2021-11-08 DIAGNOSIS — E1169 Type 2 diabetes mellitus with other specified complication: Secondary | ICD-10-CM

## 2021-11-08 DIAGNOSIS — E785 Hyperlipidemia, unspecified: Secondary | ICD-10-CM

## 2021-11-08 DIAGNOSIS — R079 Chest pain, unspecified: Secondary | ICD-10-CM | POA: Insufficient documentation

## 2021-11-08 DIAGNOSIS — I2510 Atherosclerotic heart disease of native coronary artery without angina pectoris: Secondary | ICD-10-CM | POA: Insufficient documentation

## 2021-11-08 LAB — MYOCARDIAL PERFUSION IMAGING
Angina Index: 0
Duke Treadmill Score: 8
Estimated workload: 10.1
Exercise duration (min): 8 min
LV dias vol: 107 mL (ref 62–150)
LV sys vol: 59 mL
MPHR: 169 {beats}/min
Nuc Stress EF: 44 %
Peak HR: 151 {beats}/min
Percent HR: 89 %
RPE: 18
Rest HR: 66 {beats}/min
Rest Nuclear Isotope Dose: 9.6 mCi
SDS: 1
SRS: 0
SSS: 1
ST Depression (mm): 0 mm
Stress Nuclear Isotope Dose: 30.1 mCi
TID: 0.86

## 2021-11-08 LAB — ECHOCARDIOGRAM COMPLETE
Area-P 1/2: 4.59 cm2
S' Lateral: 3.45 cm

## 2021-11-08 MED ORDER — TECHNETIUM TC 99M TETROFOSMIN IV KIT
9.6000 | PACK | Freq: Once | INTRAVENOUS | Status: AC | PRN
  Administered 2021-11-08: 9.6 via INTRAVENOUS
  Filled 2021-11-08: qty 10

## 2021-11-08 MED ORDER — TECHNETIUM TC 99M TETROFOSMIN IV KIT
30.1000 | PACK | Freq: Once | INTRAVENOUS | Status: AC | PRN
  Administered 2021-11-08: 30.1 via INTRAVENOUS
  Filled 2021-11-08: qty 31

## 2021-11-12 ENCOUNTER — Ambulatory Visit: Payer: Self-pay | Admitting: Adult Health

## 2021-11-19 ENCOUNTER — Ambulatory Visit: Payer: Self-pay | Admitting: Adult Health

## 2021-11-21 NOTE — Patient Instructions (Signed)

## 2021-11-21 NOTE — Progress Notes (Addendum)
Future Appointments  Date Time Provider Department  11/22/2021  4:00 PM Unk Pinto, MD [facility]  02/25/2022 11:30 AM Liane Comber, NP [facility]  05/27/2022 11:30 AM Liane Comber, NP [facility]  08/26/2022 11:00 AM Liane Comber, NP [facility]    History of Present Illness:       This very nice 51 y.o. MWM  presents for 3 month follow up with HTN, HLD, Pre-Diabetes and Vitamin D Deficiency. CT scan in Sept 2021 showed Aortic Atherosclerosis & Cor Art calcification.       Patient also has concerns re: chalky deformed Lt 1st toenail.        Patient is treated for HTN (2012) & BP has been controlled at home. Todays BP is at goal - 138/86.  Echocardiogram & Stress Myoview on 11/08/2021 were both Normal . Patient has had no complaints of any cardiac type chest pain, palpitations, dyspnea / orthopnea / PND, dizziness, claudication or dependent edema.       Hyperlipidemia is controlled with diet & meds. Patient denies myalgias or other med SEs. Last Lipids were at goal :  Lab Results  Component Value Date   CHOL 131 08/04/2021   HDL 38 (L) 08/04/2021   LDLCALC 75 08/04/2021   TRIG 99 08/04/2021   CHOLHDL 3.4 08/04/2021     Also, the patient has history of T2_NIDDM  circa 2012 w/CKD2  (GFR 84)  and has had no symptoms of reactive hypoglycemia, diabetic polys, paresthesias or visual blurring.   Last BMI 28 +  in Jan 2023.  Last A1c was normal & at goal :  Lab Results  Component Value Date   HGBA1C 5.6 08/04/2021                                                         Further, the patient also has history of Vitamin D Deficiency ("21" /2018) and supplements vitamin D without any suspected side-effects. Last vitamin D was at goal :   Lab Results  Component Value Date   VD25OH 69 08/04/2021     Current Outpatient Medications on File Prior to Visit  Medication Sig   APPLE CIDER VINEGAR  Take 1  morning and bedtime.   aspirin EC 81 MG tablet Take  in the morning    Cetirizine  Take by mouth daily.   Cinnamon 500 MG capsule Take 500 mg by mouth 2   times daily.    lisinopril  5 MG tablet TAKE 1 TABLET DAILY    metFORMIN -XR 500 MG  TAKE 2 TABLETS TWICE DAILY    rosuvastatin  5 MG tablet Take 1 tab 4 days a week for cholesterol (MWFSat)   traZODone  100 MG tablet TAKE 1 TABLET  EVERYDAY AT BEDTIME    No Known Allergies   PMHx:   Past Medical History:  Diagnosis Date   2-vessel coronary artery disease 11/20/2020   Acute respiratory failure with hypoxia (HCC)    Allergy    Basal cell carcinoma (BCC) of skin of nose 03/11/2021   Chronic kidney disease    COVID-19    Diabetes mellitus without complication ([place]) 4765   Fatty liver    Hyperlipidemia    Hypertension 2012   Pancreatitis    Pneumonia due to COVID-19 virus 04/24/2020   Prostatitis 02/08/2018  Immunization History  Administered Date(s) Administered   Hep A / Hep B 12/28/2020, 02/05/2021   Hepatitis A, Adult 07/05/2021   Hepb-cpg 07/05/2021   Pneumococcal -23 07/11/2018   Td 05/28/2013   Tdap 01/21/2021     Past Surgical History:  Procedure Laterality Date   CHOLECYSTECTOMY, LAPAROSCOPIC  2011   VASECTOMY  2016    FHx:    Reviewed / unchanged  SHx:    Reviewed / unchanged   Systems Review:  Constitutional: Denies fever, chills, wt changes, headaches, insomnia, fatigue, night sweats, change in appetite. Eyes: Denies redness, blurred vision, diplopia, discharge, itchy, watery eyes.  ENT: Denies discharge, congestion, post nasal drip, epistaxis, sore throat, earache, hearing loss, dental pain, tinnitus, vertigo, sinus pain, snoring.  CV: Denies chest pain, palpitations, irregular heartbeat, syncope, dyspnea, diaphoresis, orthopnea, PND, claudication or edema. Respiratory: denies cough, dyspnea, DOE, pleurisy, hoarseness, laryngitis, wheezing.  Gastrointestinal: Denies dysphagia, odynophagia, heartburn, reflux, water brash, abdominal pain or cramps, nausea, vomiting,  bloating, diarrhea, constipation, hematemesis, melena, hematochezia  or hemorrhoids. Genitourinary: Denies dysuria, frequency, urgency, nocturia, hesitancy, discharge, hematuria or flank pain. Musculoskeletal: Denies arthralgias, myalgias, stiffness, jt. swelling, pain, limping or strain/sprain.  Skin: Denies pruritus, rash, hives, warts, acne, eczema or change in skin lesion(s). Neuro: No weakness, tremor, incoordination, spasms, paresthesia or pain. Psychiatric: Denies confusion, memory loss or sensory loss. Endo: Denies change in weight, skin or hair change.  Heme/Lymph: No excessive bleeding, bruising or enlarged lymph nodes.  Physical Exam  BP 138/86    Pulse 87    Temp 97.7 F (36.5 C)    Resp 16    Ht '6\' 2"'$  (1.88 m)    Wt 221 lb (100.2 kg)    SpO2 99%    BMI 28.37 kg/m   Appears  well nourished, well groomed  and in no distress.  Eyes: PERRLA, EOMs, conjunctiva no swelling or erythema. Sinuses: No frontal/maxillary tenderness ENT/Mouth: EAC's clear, TM's nl w/o erythema, bulging. Nares clear w/o erythema, swelling, exudates. Oropharynx clear without erythema or exudates. Oral hygiene is good. Tongue normal, non obstructing. Hearing intact.  Neck: Supple. Thyroid not palpable. Car 2+/2+ without bruits, nodes or JVD. Chest: Respirations nl with BS clear & equal w/o rales, rhonchi, wheezing or stridor.  Cor: Heart sounds normal w/ regular rate and rhythm without sig. murmurs, gallops, clicks or rubs. Peripheral pulses normal and equal  without edema.  Abdomen: Soft & bowel sounds normal. Non-tender w/o guarding, rebound, hernias, masses or organomegaly.  Lymphatics: Unremarkable.  Musculoskeletal: Full ROM all peripheral extremities, joint stability, 5/5 strength and normal gait.  Skin: Warm, dry without exposed rashes, lesions or ecchymosis apparent. Left 1st toenail thickened & chalky white.  Neuro: Cranial nerves intact, reflexes equal bilaterally. Sensory-motor testing grossly  intact. Tendon reflexes grossly intact.  Pysch: Alert & oriented x 3.  Insight and judgement nl & appropriate. No ideations.  Assessment and Plan:  1. Essential hypertension  - Continue medication, monitor blood pressure at home.  - Continue DASH diet.  Reminder to go to the ER if any CP,  SOB, nausea, dizziness, severe HA, changes vision/speech.   - CBC with Differential/Platelet - COMPLETE METABOLIC PANEL WITH GFR - Magnesium - TSH  2. Hyperlipidemia associated with type 2 diabetes mellitus ([place])  - Continue diet/meds, exercise,& lifestyle modifications.  - Continue monitor periodic cholesterol/liver & renal functions    - Lipid panel - TSH  3. Type 2 diabetes mellitus with stage 2 chronic kidney  disease, without long-term current use  of insulin ([place])  - Continue diet, exercise  - Lifestyle modifications.  - Monitor appropriate labs   - Discussion w/patient starting a GLP-1 agonist for weight loss & hopefully being able to stop all Diabetic meds if he loses to goal weight of 190 #   - Hemoglobin A1c - Insulin, random  4. Vitamin D deficiency  - Continue supplementation   - VITAMIN D 25 Hydroxy  5. Aortic atherosclerosis ([place]) - by Chest CT scan -  05/2020  - Lipid panel  6. Class 2 severe obesity due to excess calories with  serious comorbidity in adult, unspecified BMI (HCC)  - TSH  7. Onychomycosis Left 1st toenail   - Rx : Lamisil x 90 days    8.  Medication management  - CBC with Differential/Platelet - COMPLETE METABOLIC PANEL WITH GFR - Magnesium - Lipid panel - TSH - Hemoglobin A1c - Insulin, random - VITAMIN D 25 Hydroxy         Discussed  regular exercise, BP monitoring, weight control to achieve/maintain BMI less than 25 and discussed med and SE's. Recommended labs to assess and monitor clinical status with further disposition pending results of labs.  I discussed the assessment and treatment plan with the patient. The patient was  provided an opportunity to ask questions and all were answered. The patient agreed with the plan and demonstrated an understanding of the instructions.  I provided over 30 minutes of exam, counseling, chart review and  complex critical decision making.        The patient was advised to call back or seek an in-person evaluation if the symptoms worsen or if the condition fails to improve as anticipated.   Kirtland Bouchard, MD

## 2021-11-22 ENCOUNTER — Other Ambulatory Visit: Payer: Self-pay

## 2021-11-22 ENCOUNTER — Ambulatory Visit (INDEPENDENT_AMBULATORY_CARE_PROVIDER_SITE_OTHER): Payer: Managed Care, Other (non HMO) | Admitting: Internal Medicine

## 2021-11-22 ENCOUNTER — Encounter: Payer: Self-pay | Admitting: Internal Medicine

## 2021-11-22 VITALS — BP 138/86 | HR 87 | Temp 97.7°F | Resp 16 | Ht 74.0 in | Wt 221.0 lb

## 2021-11-22 DIAGNOSIS — I10 Essential (primary) hypertension: Secondary | ICD-10-CM | POA: Diagnosis not present

## 2021-11-22 DIAGNOSIS — E559 Vitamin D deficiency, unspecified: Secondary | ICD-10-CM

## 2021-11-22 DIAGNOSIS — E785 Hyperlipidemia, unspecified: Secondary | ICD-10-CM | POA: Diagnosis not present

## 2021-11-22 DIAGNOSIS — I700 Atherosclerosis of aorta: Secondary | ICD-10-CM

## 2021-11-22 DIAGNOSIS — E1169 Type 2 diabetes mellitus with other specified complication: Secondary | ICD-10-CM | POA: Diagnosis not present

## 2021-11-22 DIAGNOSIS — Z79899 Other long term (current) drug therapy: Secondary | ICD-10-CM | POA: Diagnosis not present

## 2021-11-22 DIAGNOSIS — N182 Chronic kidney disease, stage 2 (mild): Secondary | ICD-10-CM

## 2021-11-22 DIAGNOSIS — E1122 Type 2 diabetes mellitus with diabetic chronic kidney disease: Secondary | ICD-10-CM | POA: Diagnosis not present

## 2021-11-22 DIAGNOSIS — B351 Tinea unguium: Secondary | ICD-10-CM | POA: Insufficient documentation

## 2021-11-22 MED ORDER — TERBINAFINE HCL 250 MG PO TABS: ORAL_TABLET | ORAL | 1 refills | Status: DC

## 2021-11-22 MED ORDER — TIRZEPATIDE 2.5 MG/0.5ML ~~LOC~~ SOAJ: 2.5000 mg | SUBCUTANEOUS | 0 refills | Status: DC

## 2021-11-22 MED ORDER — TIRZEPATIDE 2.5 MG/0.5ML ~~LOC~~ SOAJ: 2.5000 mg | SUBCUTANEOUS | 2 refills | Status: DC

## 2021-11-22 NOTE — Addendum Note (Signed)
Addended by: Unk Pinto on: 11/22/2021 11:07 PM ? ? Modules accepted: Orders ? ?

## 2021-11-23 ENCOUNTER — Other Ambulatory Visit: Payer: Self-pay | Admitting: Internal Medicine

## 2021-11-23 DIAGNOSIS — E1122 Type 2 diabetes mellitus with diabetic chronic kidney disease: Secondary | ICD-10-CM

## 2021-11-23 LAB — CBC WITH DIFFERENTIAL/PLATELET
Absolute Monocytes: 594 cells/uL (ref 200–950)
Basophils Absolute: 65 cells/uL (ref 0–200)
Basophils Relative: 0.6 %
Eosinophils Absolute: 281 cells/uL (ref 15–500)
Eosinophils Relative: 2.6 %
HCT: 49.2 % (ref 38.5–50.0)
Hemoglobin: 16.8 g/dL (ref 13.2–17.1)
Lymphs Abs: 3110 cells/uL (ref 850–3900)
MCH: 29.6 pg (ref 27.0–33.0)
MCHC: 34.1 g/dL (ref 32.0–36.0)
MCV: 86.8 fL (ref 80.0–100.0)
MPV: 11.1 fL (ref 7.5–12.5)
Monocytes Relative: 5.5 %
Neutro Abs: 6750 cells/uL (ref 1500–7800)
Neutrophils Relative %: 62.5 %
Platelets: 232 10*3/uL (ref 140–400)
RBC: 5.67 10*6/uL (ref 4.20–5.80)
RDW: 12.5 % (ref 11.0–15.0)
Total Lymphocyte: 28.8 %
WBC: 10.8 10*3/uL (ref 3.8–10.8)

## 2021-11-23 LAB — COMPLETE METABOLIC PANEL WITH GFR
AG Ratio: 1.9 (calc) (ref 1.0–2.5)
ALT: 37 U/L (ref 9–46)
AST: 22 U/L (ref 10–35)
Albumin: 4.7 g/dL (ref 3.6–5.1)
Alkaline phosphatase (APISO): 53 U/L (ref 35–144)
BUN: 18 mg/dL (ref 7–25)
CO2: 29 mmol/L (ref 20–32)
Calcium: 10.1 mg/dL (ref 8.6–10.3)
Chloride: 104 mmol/L (ref 98–110)
Creat: 1.09 mg/dL (ref 0.70–1.30)
Globulin: 2.5 g/dL (calc) (ref 1.9–3.7)
Glucose, Bld: 101 mg/dL — ABNORMAL HIGH (ref 65–99)
Potassium: 4.2 mmol/L (ref 3.5–5.3)
Sodium: 140 mmol/L (ref 135–146)
Total Bilirubin: 0.8 mg/dL (ref 0.2–1.2)
Total Protein: 7.2 g/dL (ref 6.1–8.1)
eGFR: 82 mL/min/{1.73_m2} (ref >=60)

## 2021-11-23 LAB — HEMOGLOBIN A1C
Hgb A1c MFr Bld: 6.2 % of total Hgb — ABNORMAL HIGH (ref <5.7)
Mean Plasma Glucose: 131 mg/dL
eAG (mmol/L): 7.3 mmol/L

## 2021-11-23 LAB — INSULIN, RANDOM: Insulin: 10.7 u[IU]/mL

## 2021-11-23 LAB — LIPID PANEL
Cholesterol: 158 mg/dL (ref <200)
HDL: 38 mg/dL — ABNORMAL LOW (ref >=40)
LDL Cholesterol (Calc): 89 mg/dL (calc)
Non-HDL Cholesterol (Calc): 120 mg/dL (calc) (ref <130)
Total CHOL/HDL Ratio: 4.2 (calc) (ref <5.0)
Triglycerides: 212 mg/dL — ABNORMAL HIGH (ref <150)

## 2021-11-23 LAB — TSH: TSH: 2.01 mIU/L (ref 0.40–4.50)

## 2021-11-23 LAB — MAGNESIUM: Magnesium: 1.9 mg/dL (ref 1.5–2.5)

## 2021-11-23 LAB — VITAMIN D 25 HYDROXY (VIT D DEFICIENCY, FRACTURES): Vit D, 25-Hydroxy: 38 ng/mL (ref 30–100)

## 2021-11-23 MED ORDER — OZEMPIC (0.25 OR 0.5 MG/DOSE) 2 MG/1.5ML ~~LOC~~ SOPN: 0.5000 mg | PEN_INJECTOR | SUBCUTANEOUS | 1 refills | Status: DC

## 2021-11-23 NOTE — Progress Notes (Signed)
<><><><><><><><><><><><><><><><><><><><><><><><><><><><><><><><><> ?<><><><><><><><><><><><><><><><><><><><><><><><><><><><><><><><><> ?-   Test results slightly outside the reference range are not unusual. ?If there is anything important, I will review this with you,  ?otherwise it is considered normal test values.  ?If you have further questions,  ?please do not hesitate to contact me at the office or via My Chart. <><><><><><><><><><><><><><><><><><><><><><><><><><><><><><><><><> ?<><><><><><><><><><><><><><><><><><><><><><><><><><><><><><><><><> ? ?-  Total Chol = 158   & LDL  = 89    -    Both  Excellent  ? ?- Very low risk for Heart Attack  / Stroke ?============================================================ ?============================================================ ? ?-  But Triglycerides (   212    ) or fats in blood are too high  ?(goal is less than 150)   ? ?- Recommend avoid fried & greasy foods,  sweets / candy,  ? ?- Avoid white rice  ?(brown or wild rice or Quinoa is OK),  ? ?- Avoid white potatoes  ?(sweet potatoes are OK)  ? ?- Avoid anything made from white flour  ?- bagels, doughnuts, rolls, buns, biscuits, white and  ? ?wheat breads, pizza crust and traditional  ?pasta made of white flour & egg white ? ?- (vegetarian pasta or spinach or wheat pasta is OK).   ? ?- Multi-grain bread is OK - like multi-grain flat bread or ? sandwich thins.  ? ?- Avoid alcohol in excess.  ? ?- Exercise is also important. ?<><><><><><><><><><><><><><><><><><><><><><><><><><><><><><><><><> ?<><><><><><><><><><><><><><><><><><><><><><><><><><><><><><><><><> ? ?-  A1c has gone up slightly from 5.6% (normal) to now 6.2%   ? ?                                                              - slightly elevated as expected with weight gain  ?<><><><><><><><><><><><><><><><><><><><><><><><><><><><><><><><><> ?<><><><><><><><><><><><><><><><><><><><><><><><><><><><><><><><><> ? ?-  Vitamin D = 38 - Very low  ? ?- Vitamin D goal  is between 70-100.  ? ?- Please Take Vitamin D = 10,000 units  /day !  Vitamin D a ? ?- It is very important as a natural anti-inflammatory and helping the  ?immune system protect against viral infections, like the Covid-19  ? ? ?helping hair, skin, and nails, as well as reducing stroke and  ?heart attack risk.  ? ?- It helps your bones and helps with mood. ? ?- It also decreases numerous cancer risks so please  ?take it as directed.  ? ?- Low Vit D is associated with a 200-300% higher risk for  ?CANCER  ? ?and 200-300% higher risk for HEART   ATTACK  &  STROKE.   ? ?- It is also associated with higher death rate at younger ages,  ? ?autoimmune diseases like Rheumatoid arthritis, Lupus,  ?Multiple Sclerosis.    ? ?- Also many other serious conditions, like depression, Alzheimer's ? ?Dementia, infertility, muscle aches, fatigue, fibromyalgia  ? ?- just to name a few. ?<><><><><><><><><><><><><><><><><><><><><><><><><><><><><><><><><> ?<><><><><><><><><><><><><><><><><><><><><><><><><><><><><><><><><> ? ?-  All Else - CBC - Kidneys - Electrolytes - Liver - Magnesium & Thyroid   ? ?- all  Normal / OK ?<><><><><><><><><><><><><><><><><><><><><><><><><><><><><><><><><> ?<><><><><><><><><><><><><><><><><><><><><><><><><><><><><><><><><> ? ?- Mounjaro & Trulicity apparently failed , so will request Ozempic  ? ? ?<><><><><><><><><><><><><><><><><><><><><><><><><><><><><><><><><> ?<><><><><><><><><><><><><><><><><><><><><><><><><><><><><><><><><> ? ? ? ? ? ? ? ? ? ? ? ? ? ? ? ? ? ? ? ? ? ? ? ? ? ? ? ? ? ? ?

## 2021-12-14 ENCOUNTER — Other Ambulatory Visit: Payer: Self-pay

## 2021-12-14 DIAGNOSIS — E1122 Type 2 diabetes mellitus with diabetic chronic kidney disease: Secondary | ICD-10-CM

## 2021-12-14 MED ORDER — LANCETS MICRO THIN 33G MISC: 1 refills | Status: AC

## 2021-12-14 MED ORDER — CONTOUR NEXT MONITOR W/DEVICE KIT: PACK | 0 refills | Status: AC

## 2021-12-14 MED ORDER — CONTOUR NEXT TEST VI STRP: ORAL_STRIP | 1 refills | Status: AC

## 2021-12-20 ENCOUNTER — Other Ambulatory Visit: Payer: Self-pay

## 2021-12-20 MED ORDER — FEXOFENADINE HCL 180 MG PO TABS: 180.0000 mg | ORAL_TABLET | Freq: Every day | ORAL | 1 refills | Status: DC

## 2022-01-15 ENCOUNTER — Other Ambulatory Visit: Payer: Self-pay | Admitting: Adult Health

## 2022-01-15 DIAGNOSIS — E119 Type 2 diabetes mellitus without complications: Secondary | ICD-10-CM

## 2022-02-11 ENCOUNTER — Ambulatory Visit: Payer: Self-pay | Admitting: Adult Health

## 2022-02-13 ENCOUNTER — Other Ambulatory Visit: Payer: Self-pay | Admitting: Nurse Practitioner

## 2022-02-25 ENCOUNTER — Ambulatory Visit (INDEPENDENT_AMBULATORY_CARE_PROVIDER_SITE_OTHER): Payer: Managed Care, Other (non HMO) | Admitting: Adult Health

## 2022-02-25 ENCOUNTER — Ambulatory Visit: Payer: Managed Care, Other (non HMO) | Admitting: Adult Health

## 2022-02-25 ENCOUNTER — Encounter: Payer: Self-pay | Admitting: Adult Health

## 2022-02-25 VITALS — BP 110/78 | HR 76 | Temp 97.9°F | Wt 205.0 lb

## 2022-02-25 DIAGNOSIS — E1169 Type 2 diabetes mellitus with other specified complication: Secondary | ICD-10-CM

## 2022-02-25 DIAGNOSIS — I700 Atherosclerosis of aorta: Secondary | ICD-10-CM

## 2022-02-25 DIAGNOSIS — I2510 Atherosclerotic heart disease of native coronary artery without angina pectoris: Secondary | ICD-10-CM

## 2022-02-25 DIAGNOSIS — I10 Essential (primary) hypertension: Secondary | ICD-10-CM

## 2022-02-25 DIAGNOSIS — E1122 Type 2 diabetes mellitus with diabetic chronic kidney disease: Secondary | ICD-10-CM

## 2022-02-25 DIAGNOSIS — Z79899 Other long term (current) drug therapy: Secondary | ICD-10-CM | POA: Diagnosis not present

## 2022-02-25 DIAGNOSIS — E785 Hyperlipidemia, unspecified: Secondary | ICD-10-CM | POA: Diagnosis not present

## 2022-02-25 DIAGNOSIS — N182 Chronic kidney disease, stage 2 (mild): Secondary | ICD-10-CM

## 2022-02-25 DIAGNOSIS — E663 Overweight: Secondary | ICD-10-CM

## 2022-02-25 DIAGNOSIS — E559 Vitamin D deficiency, unspecified: Secondary | ICD-10-CM

## 2022-02-25 MED ORDER — TRAZODONE HCL 100 MG PO TABS: ORAL_TABLET | ORAL | 2 refills | Status: DC

## 2022-02-25 MED ORDER — ALBUTEROL SULFATE HFA 108 (90 BASE) MCG/ACT IN AERS: 1.0000 | INHALATION_SPRAY | RESPIRATORY_TRACT | 0 refills | Status: DC | PRN

## 2022-02-25 NOTE — Patient Instructions (Signed)

## 2022-02-25 NOTE — Progress Notes (Signed)
FOLLOW UP  Assessment and Plan:   Atherosclerosis of aorta (HCC) - per CT 05/2020 Control blood pressure, cholesterol, glucose, increase exercise.   2 vessel CAD Has seen cardiology, benign stress test Continue statin for LDL goal <70, continue ASA, aggressive lifestyle interventions Go to the ER if any chest pain, shortness of breath, nausea, dizziness, severe HA, changes vision/speech  Hypertension Well controlled with current medications, having some orthostatic hypotension, try 1/2 tab lisinopril, continue if remains at goal Monitor blood pressure at home; patient to call if consistently greater than 130/80 Continue DASH diet.   Reminder to go to the ER if any CP, SOB, nausea, dizziness, severe HA, changes vision/speech, left arm numbness and tingling and jaw pain.  Cholesterol Currently above goal of LDL <70; titrate up on rosuvastatin as needed for goal; currently taking 5 mg 4 days/week  Continue low cholesterol diet and exercise.  Check lipid panel.   Diabetes with diabetic chronic kidney disease Continue medication: ozempic, holding metformin unless atypical elevations Continue diet and exercise.  Perform daily foot/skin check, notify office of any concerning changes.  Check A1C  BMI 26  Long discussion about weight loss, diet, and exercise Recommended diet heavy in fruits and veggies and low in animal meats, cheeses, and dairy products, appropriate calorie intake Discussed ideal weight for height  Continue current efforts with lifestyle modification - recommendation given to stop nightly sugar soda Will follow up in 3 months  Vitamin D Def He is back on 50000 IU once a week which has worked well for him in the past; continue for goal 60-100 Defer Vit D   Continue diet and meds as discussed. Further disposition pending results of labs. Discussed med's effects and SE's.   Over 30 minutes of exam, counseling, chart review, and critical decision making was performed.    Future Appointments  Date Time Provider [place]  05/27/2022 11:30 AM Alycia Rossetti, NP [facility] None  08/26/2022 11:00 AM Unk Pinto, MD [facility] None    ----------------------------------------------------------------------------------------------------------------------  HPI 51 y.o. male  presents for 3 month follow up on hypertension, cholesterol, diabetes, weight and vitamin D deficiency.   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   He had fatty liver with elevated LFTs, recent External CT abd/pelvis w & w/o contrast 09/28/2020 showed prominent caudate lob and slight lobularity of the lateral segment left hepatic lob suspicious for early cirrhosis. No current significant hepatic steatosis. No liver mass - also known stable pancreatic cyst; GI Dr. Carlean Purl is now following.   BMI is Body mass index is 26.32 kg/m., he has been working on diet and exercise. He is on ozempic. Reports drinks some diet soda (2/day), does drink 1 mountain dew on night shift. Doing lots of [place] wraps, eggs, chicken salad with low carb chips and fruit, will do atkins bars while at work.   Wt Readings from Last 3 Encounters:  02/25/22 205 lb (93 kg)  11/22/21 221 lb (100.2 kg)  10/12/21 220 lb 12.8 oz (100.2 kg)   His blood pressure has been controlled at home (118/70s, etc), today their BP is BP: 110/78 He does not workout but work physically active job. He denies chest pain, shortness of breath, dizziness.  CT 05/2020 showed aortic atherosclerosis, 2 vessel CAD, was initiated on rosuvastatin. Echocardiogram & Stress Myoview on 11/08/2021 were both Normal,    He is not on cholesterol medication (was prescribed rosuvastatin 5 mg, taking  4 days a week) and denies myalgias. His cholesterol is not at goal. The cholesterol last visit was:   Lab Results  Component Value Date   CHOL 158 11/22/2021    HDL 38 (L) 11/22/2021   LDLCALC 89 11/22/2021   TRIG 212 (H) 11/22/2021   CHOLHDL 4.2 11/22/2021    He has been working on diet and exercise for T2 diabetes currently by ozempic 0.5 mg week (holding metformin due to well controlled glucose), and denies foot ulcerations, increased appetite, nausea, paresthesia of the feet, polydipsia, polyuria, visual disturbances, vomiting and weight loss.  He checks fasting glucose, typically <110 Last A1C in the office was:  Lab Results  Component Value Date   HGBA1C 6.2 (H) 11/22/2021   CKD II associated with T2DM monitored at this office, on lisinopril. Last GFR:  Lab Results  Component Value Date   EGFR 82 11/22/2021   Patient is on Vitamin D supplement and below goal at last check, reports has resumed taking 50000 IU weekly:    Lab Results  Component Value Date   VD25OH 38 11/22/2021        Current Medications:  Current Outpatient Medications on File Prior to Visit  Medication Sig   APPLE CIDER VINEGAR PO Take 1 capsule by mouth in the morning and at bedtime.   aspirin EC 81 MG tablet Take 81 mg by mouth in the morning. Swallow whole.   Blood Glucose Monitoring Suppl (CONTOUR NEXT MONITOR) w/Device KIT Check blood sugar once daily   Cetirizine HCl (ZYRTEC PO) Take by mouth daily.   Cinnamon 500 MG capsule Take 500 mg by mouth 2 (two) times daily.    fexofenadine (ALLEGRA) 180 MG tablet Take  1 tablet  Daily  for Allergies   glucose blood (CONTOUR NEXT TEST) test strip Check blood sugar once daily   Lancets Micro Thin 33G MISC Check blood sugar once daily   lisinopril (ZESTRIL) 5 MG tablet TAKE 1 TABLET DAILY FOR BLOOD PRESSURE   Multiple Vitamins-Minerals (MULTIVITAMIN MEN 50+) TABS Take by mouth daily.   rosuvastatin (CRESTOR) 5 MG tablet Take 1 tab 4 days a week for cholesterol (MWFSat)   Semaglutide,0.25 or 0.5MG/DOS, (OZEMPIC, 0.25 OR 0.5 MG/DOSE,) 2 MG/1.5ML SOPN Inject 0.5 mg into the skin once a week.   metFORMIN  (GLUCOPHAGE-XR) 500 MG 24 hr tablet TAKE 2 TABLETS BY MOUTH TWICE DAILY WITH MEALS FOR DIABETES. (Patient not taking: Reported on 02/25/2022)   terbinafine (LAMISIL) 250 MG tablet Take 1 tablet Daily for Nail Fungus (Patient not taking: Reported on 02/25/2022)   No current facility-administered medications on file prior to visit.     Allergies: No Known Allergies   Medical History:  Past Medical History:  Diagnosis Date   2-vessel coronary artery disease 11/20/2020   Acute respiratory failure with hypoxia (HCC)    Allergy    Basal cell carcinoma (BCC) of skin of nose 03/11/2021   Chronic kidney disease    COVID-19    Diabetes mellitus without complication ([place]) 5573   Fatty liver    Hyperlipidemia    Hypertension 2012   Pancreatitis    Pneumonia due to COVID-19 virus 04/24/2020   Prostatitis 02/08/2018   Family history- Reviewed and unchanged Social history- Reviewed and unchanged   Review of Systems:  Review of Systems  Constitutional:  Negative for malaise/fatigue and weight loss.  HENT:  Negative for hearing loss and tinnitus.   Eyes:  Negative for blurred vision and double vision.  Respiratory:  Negative for cough, shortness of breath and wheezing.   Cardiovascular:  Negative for chest pain, palpitations, orthopnea, claudication and leg swelling.  Gastrointestinal:  Negative for abdominal pain, blood in stool, constipation, diarrhea, heartburn, melena, nausea and vomiting.  Genitourinary: Negative.   Musculoskeletal:  Negative for joint pain and myalgias.  Skin:  Negative for rash.  Neurological:  Negative for dizziness, tingling, sensory change, weakness and headaches.  Endo/Heme/Allergies:  Negative for polydipsia.  Psychiatric/Behavioral: Negative.    All other systems reviewed and are negative.   Physical Exam: BP 110/78   Pulse 76   Temp 97.9 F (36.6 C)   Wt 205 lb (93 kg)   SpO2 99%   BMI 26.32 kg/m  Wt Readings from Last 3 Encounters:  02/25/22 205 lb (93  kg)  11/22/21 221 lb (100.2 kg)  10/12/21 220 lb 12.8 oz (100.2 kg)   General Appearance: Well nourished adults male, appears to be feeling unwell but in no apparent distress. Eyes: PERRLA, conjunctiva no swelling or erythema Sinuses: No Frontal/maxillary tenderness ENT/Mouth: Ext aud canals clear, TMs without erythema, bulging. Post pharynx without swelling, or exudate on post pharynx.  Tonsils not swollen or erythematous. Hearing normal.  Neck: Supple Respiratory: Respiratory effort normal, BS equal bilaterally without rales, rhonchi, wheezing or stridor.  Cardio: RRR with no MRGs. Brisk peripheral pulses without edema.  Abdomen: Soft, + BS.  Non tender, no rebound, no palpable organomegaly.  Lymphatics: Non tender without lymphadenopathy.  Musculoskeletal: Full ROM, 5/5 strength, Normal gait Skin: Warm, dry, no lesions, ecchymosis, rash.  Neuro: Cranial nerves intact. No cerebellar symptoms. Distal sensation intact.  Psych: Awake and oriented X 3, normal affect, Insight and Judgment appropriate.   Izora Ribas, NP 9:09 AM [facility]

## 2022-02-26 ENCOUNTER — Other Ambulatory Visit: Payer: Self-pay | Admitting: Adult Health

## 2022-02-26 LAB — TSH: TSH: 1.76 mIU/L (ref 0.40–4.50)

## 2022-02-26 LAB — CBC WITH DIFFERENTIAL/PLATELET
Absolute Monocytes: 488 cells/uL (ref 200–950)
Basophils Absolute: 80 cells/uL (ref 0–200)
Basophils Relative: 1 %
Eosinophils Absolute: 160 cells/uL (ref 15–500)
Eosinophils Relative: 2 %
HCT: 46.4 % (ref 38.5–50.0)
Hemoglobin: 16 g/dL (ref 13.2–17.1)
Lymphs Abs: 2448 cells/uL (ref 850–3900)
MCH: 30.4 pg (ref 27.0–33.0)
MCHC: 34.5 g/dL (ref 32.0–36.0)
MCV: 88 fL (ref 80.0–100.0)
MPV: 11.3 fL (ref 7.5–12.5)
Monocytes Relative: 6.1 %
Neutro Abs: 4824 cells/uL (ref 1500–7800)
Neutrophils Relative %: 60.3 %
Platelets: 233 10*3/uL (ref 140–400)
RBC: 5.27 10*6/uL (ref 4.20–5.80)
RDW: 12.3 % (ref 11.0–15.0)
Total Lymphocyte: 30.6 %
WBC: 8 10*3/uL (ref 3.8–10.8)

## 2022-02-26 LAB — COMPLETE METABOLIC PANEL WITH GFR
AG Ratio: 1.7 (calc) (ref 1.0–2.5)
ALT: 21 U/L (ref 9–46)
AST: 19 U/L (ref 10–35)
Albumin: 4.5 g/dL (ref 3.6–5.1)
Alkaline phosphatase (APISO): 56 U/L (ref 35–144)
BUN: 15 mg/dL (ref 7–25)
CO2: 26 mmol/L (ref 20–32)
Calcium: 9.6 mg/dL (ref 8.6–10.3)
Chloride: 104 mmol/L (ref 98–110)
Creat: 1.06 mg/dL (ref 0.70–1.30)
Globulin: 2.6 g/dL (calc) (ref 1.9–3.7)
Glucose, Bld: 81 mg/dL (ref 65–99)
Potassium: 4.4 mmol/L (ref 3.5–5.3)
Sodium: 139 mmol/L (ref 135–146)
Total Bilirubin: 0.7 mg/dL (ref 0.2–1.2)
Total Protein: 7.1 g/dL (ref 6.1–8.1)
eGFR: 85 mL/min/{1.73_m2} (ref >=60)

## 2022-02-26 LAB — MAGNESIUM: Magnesium: 1.9 mg/dL (ref 1.5–2.5)

## 2022-02-26 LAB — LIPID PANEL
Cholesterol: 121 mg/dL (ref <200)
HDL: 37 mg/dL — ABNORMAL LOW (ref >=40)
LDL Cholesterol (Calc): 68 mg/dL (calc)
Non-HDL Cholesterol (Calc): 84 mg/dL (calc) (ref <130)
Total CHOL/HDL Ratio: 3.3 (calc) (ref <5.0)
Triglycerides: 76 mg/dL (ref <150)

## 2022-02-26 LAB — HEMOGLOBIN A1C
Hgb A1c MFr Bld: 5.3 % of total Hgb (ref <5.7)
Mean Plasma Glucose: 105 mg/dL
eAG (mmol/L): 5.8 mmol/L

## 2022-03-04 ENCOUNTER — Other Ambulatory Visit: Payer: Self-pay | Admitting: Adult Health

## 2022-03-04 DIAGNOSIS — I10 Essential (primary) hypertension: Secondary | ICD-10-CM

## 2022-04-29 ENCOUNTER — Other Ambulatory Visit: Payer: Self-pay | Admitting: Internal Medicine

## 2022-04-29 MED ORDER — CEPHALEXIN 500 MG PO CAPS
ORAL_CAPSULE | ORAL | 1 refills | Status: DC
Start: 2022-04-29 — End: 2022-06-13

## 2022-05-13 ENCOUNTER — Ambulatory Visit: Payer: Self-pay | Admitting: Adult Health

## 2022-05-15 ENCOUNTER — Encounter: Payer: Self-pay | Admitting: Internal Medicine

## 2022-05-15 ENCOUNTER — Other Ambulatory Visit: Payer: Self-pay | Admitting: Internal Medicine

## 2022-05-15 MED ORDER — OZEMPIC (1 MG/DOSE) 4 MG/3ML ~~LOC~~ SOPN: PEN_INJECTOR | SUBCUTANEOUS | 0 refills | Status: DC

## 2022-05-20 ENCOUNTER — Ambulatory Visit: Payer: Self-pay | Admitting: Adult Health

## 2022-05-27 ENCOUNTER — Ambulatory Visit: Payer: Self-pay | Admitting: Nurse Practitioner

## 2022-06-02 NOTE — Progress Notes (Deleted)
FOLLOW UP  Assessment and Plan:   Atherosclerosis of aorta (HCC) - per CT 05/2020 Control blood pressure, cholesterol, glucose, increase exercise.   2 vessel CAD Has seen cardiology, benign stress test Continue statin for LDL goal <70, continue ASA, aggressive lifestyle interventions Go to the ER if any chest pain, shortness of breath, nausea, dizziness, severe HA, changes vision/speech  Hypertension Well controlled with current medications, having some orthostatic hypotension, try 1/2 tab lisinopril, continue if remains at goal Monitor blood pressure at home; patient to call if consistently greater than 130/80 Continue DASH diet.   Reminder to go to the ER if any CP, SOB, nausea, dizziness, severe HA, changes vision/speech, left arm numbness and tingling and jaw pain.  Hyperlipidemia associated with type 2 DM Currently above goal of LDL <70; titrate up on rosuvastatin as needed for goal; currently taking 5 mg 4 days/week  Continue low cholesterol diet and exercise.  Check lipid panel.   Diabetes with diabetic chronic kidney disease Continue medication: ozempic, holding metformin unless atypical elevations Continue diet and exercise.  Perform daily foot/skin check, notify office of any concerning changes.  Check A1C  BMI 26  Long discussion about weight loss, diet, and exercise Recommended diet heavy in fruits and veggies and low in animal meats, cheeses, and dairy products, appropriate calorie intake Discussed ideal weight for height  Continue current efforts with lifestyle modification - recommendation given to stop nightly sugar soda Will follow up in 3 months  Vitamin D Def He is back on 50000 IU once a week which has worked well for him in the past; continue for goal 60-100 Defer Vit D   Continue diet and meds as discussed. Further disposition pending results of labs. Discussed med's effects and SE's.   Over 30 minutes of exam, counseling, chart review, and critical  decision making was performed.   Future Appointments  Date Time Provider [place]  06/03/2022  9:00 AM Alycia Rossetti, NP [facility] None  08/26/2022 11:00 AM Unk Pinto, MD [facility] None    ----------------------------------------------------------------------------------------------------------------------  HPI 51 y.o. male  presents for 3 month follow up on hypertension, cholesterol, diabetes, weight and vitamin D deficiency.   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   He had fatty liver with elevated LFTs, recent External CT abd/pelvis w & w/o contrast 09/28/2020 showed prominent caudate lob and slight lobularity of the lateral segment left hepatic lob suspicious for early cirrhosis. No current significant hepatic steatosis. No liver mass - also known stable pancreatic cyst; GI Dr. Carlean Purl is now following.   BMI is There is no height or weight on file to calculate BMI., he has been working on diet and exercise. He is on ozempic. Reports drinks some diet soda (2/day), does drink 1 mountain dew on night shift. Doing lots of [place] wraps, eggs, chicken salad with low carb chips and fruit, will do atkins bars while at work.   Wt Readings from Last 3 Encounters:  02/25/22 205 lb (93 kg)  11/22/21 221 lb (100.2 kg)  10/12/21 220 lb 12.8 oz (100.2 kg)   His blood pressure has been controlled at home (118/70s, etc), today their BP is   He does not workout but work physically active job. He denies chest pain, shortness of breath, dizziness.  CT 05/2020 showed aortic atherosclerosis, 2 vessel CAD, was initiated on rosuvastatin. Echocardiogram & Stress Myoview on 11/08/2021 were both Normal,    He  is not on cholesterol medication (was prescribed rosuvastatin 5 mg, taking 4 days a week) and denies myalgias. His cholesterol is not at goal. The cholesterol last visit was:   Lab Results   Component Value Date   CHOL 121 02/25/2022   HDL 37 (L) 02/25/2022   LDLCALC 68 02/25/2022   TRIG 76 02/25/2022   CHOLHDL 3.3 02/25/2022    He has been working on diet and exercise for T2 diabetes currently by ozempic 0.5 mg week (holding metformin due to well controlled glucose), and denies foot ulcerations, increased appetite, nausea, paresthesia of the feet, polydipsia, polyuria, visual disturbances, vomiting and weight loss.  He checks fasting glucose, typically <110 Last A1C in the office was:  Lab Results  Component Value Date   HGBA1C 5.3 02/25/2022   CKD II associated with T2DM monitored at this office, on lisinopril. Last GFR:  Lab Results  Component Value Date   EGFR 85 02/25/2022   Patient is on Vitamin D supplement and below goal at last check, reports has resumed taking 50000 IU weekly:    Lab Results  Component Value Date   VD25OH 38 11/22/2021        Current Medications:  Current Outpatient Medications on File Prior to Visit  Medication Sig   albuterol (VENTOLIN HFA) 108 (90 Base) MCG/ACT inhaler Inhale 1-2 puffs into the lungs every 4 (four) hours as needed for wheezing or shortness of breath.   APPLE CIDER VINEGAR PO Take 1 capsule by mouth in the morning and at bedtime.   aspirin EC 81 MG tablet Take 81 mg by mouth in the morning. Swallow whole.   Blood Glucose Monitoring Suppl (CONTOUR NEXT MONITOR) w/Device KIT Check blood sugar once daily   cephALEXin (KEFLEX) 500 MG capsule Take 1 capsule 4 x /day with meals/food for Skin Infection   Cetirizine HCl (ZYRTEC PO) Take by mouth daily.   Cholecalciferol 1.25 MG (50000 UT) TABS Take 1 tablet by mouth once a week.   Cinnamon 500 MG capsule Take 500 mg by mouth 2 (two) times daily.    fexofenadine (ALLEGRA) 180 MG tablet Take  1 tablet  Daily  for Allergies   glucose blood (CONTOUR NEXT TEST) test strip Check blood sugar once daily   Lancets Micro Thin 33G MISC Check blood sugar once daily   lisinopril  (ZESTRIL) 5 MG tablet TAKE 1 TABLET BY MOUTH EVERY DAY FOR BLOOD PRESSURE   Multiple Vitamins-Minerals (MULTIVITAMIN MEN 50+) TABS Take by mouth daily.   rosuvastatin (CRESTOR) 5 MG tablet Take 1 tab 4 days a week for cholesterol (MWFSat)   Semaglutide, 1 MG/DOSE, (OZEMPIC, 1 MG/DOSE,) 4 MG/3ML SOPN Inject 1 mg (0.75 ml)     into the Skin      Once Weekly     for Diabetes   traZODone (DESYREL) 100 MG tablet TAKE 1 TABLET BY MOUTH EVERYDAY AT BEDTIME   No current facility-administered medications on file prior to visit.     Allergies: No Known Allergies   Medical History:  Past Medical History:  Diagnosis Date   2-vessel coronary artery disease 11/20/2020   Acute respiratory failure with hypoxia (HCC)    Allergy    Basal cell carcinoma (BCC) of skin of nose 03/11/2021   Chronic kidney disease    COVID-19    Diabetes mellitus without complication ([place]) 1950   Fatty liver    Hyperlipidemia    Hypertension 2012   Pancreatitis    Pneumonia due to COVID-19 virus 04/24/2020  Prostatitis 02/08/2018   Family history- Reviewed and unchanged Social history- Reviewed and unchanged   Review of Systems:  Review of Systems  Constitutional:  Negative for malaise/fatigue and weight loss.  HENT:  Negative for hearing loss and tinnitus.   Eyes:  Negative for blurred vision and double vision.  Respiratory:  Negative for cough, shortness of breath and wheezing.   Cardiovascular:  Negative for chest pain, palpitations, orthopnea, claudication and leg swelling.  Gastrointestinal:  Negative for abdominal pain, blood in stool, constipation, diarrhea, heartburn, melena, nausea and vomiting.  Genitourinary: Negative.   Musculoskeletal:  Negative for joint pain and myalgias.  Skin:  Negative for rash.  Neurological:  Negative for dizziness, tingling, sensory change, weakness and headaches.  Endo/Heme/Allergies:  Negative for polydipsia.  Psychiatric/Behavioral: Negative.    All other systems reviewed  and are negative.   Physical Exam: There were no vitals taken for this visit. Wt Readings from Last 3 Encounters:  02/25/22 205 lb (93 kg)  11/22/21 221 lb (100.2 kg)  10/12/21 220 lb 12.8 oz (100.2 kg)   General Appearance: Well nourished adults male, appears to be feeling unwell but in no apparent distress. Eyes: PERRLA, conjunctiva no swelling or erythema Sinuses: No Frontal/maxillary tenderness ENT/Mouth: Ext aud canals clear, TMs without erythema, bulging. Post pharynx without swelling, or exudate on post pharynx.  Tonsils not swollen or erythematous. Hearing normal.  Neck: Supple Respiratory: Respiratory effort normal, BS equal bilaterally without rales, rhonchi, wheezing or stridor.  Cardio: RRR with no MRGs. Brisk peripheral pulses without edema.  Abdomen: Soft, + BS.  Non tender, no rebound, no palpable organomegaly.  Lymphatics: Non tender without lymphadenopathy.  Musculoskeletal: Full ROM, 5/5 strength, Normal gait Skin: Warm, dry, no lesions, ecchymosis, rash.  Neuro: Cranial nerves intact. No cerebellar symptoms. Distal sensation intact.  Psych: Awake and oriented X 3, normal affect, Insight and Judgment appropriate.   Alycia Rossetti, NP 8:39 AM [facility]

## 2022-06-03 ENCOUNTER — Ambulatory Visit: Payer: Self-pay | Admitting: Nurse Practitioner

## 2022-06-03 DIAGNOSIS — E1122 Type 2 diabetes mellitus with diabetic chronic kidney disease: Secondary | ICD-10-CM

## 2022-06-03 DIAGNOSIS — E785 Hyperlipidemia, unspecified: Secondary | ICD-10-CM

## 2022-06-03 DIAGNOSIS — E559 Vitamin D deficiency, unspecified: Secondary | ICD-10-CM

## 2022-06-03 DIAGNOSIS — I700 Atherosclerosis of aorta: Secondary | ICD-10-CM

## 2022-06-03 DIAGNOSIS — I10 Essential (primary) hypertension: Secondary | ICD-10-CM

## 2022-06-03 DIAGNOSIS — E663 Overweight: Secondary | ICD-10-CM

## 2022-06-03 DIAGNOSIS — I2510 Atherosclerotic heart disease of native coronary artery without angina pectoris: Secondary | ICD-10-CM

## 2022-06-03 DIAGNOSIS — Z79899 Other long term (current) drug therapy: Secondary | ICD-10-CM

## 2022-06-07 ENCOUNTER — Other Ambulatory Visit: Payer: Self-pay | Admitting: Internal Medicine
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
## 2022-06-13 ENCOUNTER — Ambulatory Visit
Admission: RE | Admit: 2022-06-13 | Discharge: 2022-06-13 | Disposition: A | Discharge status: Home / Self Care | Payer: Managed Care, Other (non HMO) | Source: Ambulatory Visit | Attending: Nurse Practitioner

## 2022-06-13 ENCOUNTER — Encounter: Payer: Self-pay | Admitting: Nurse Practitioner

## 2022-06-13 ENCOUNTER — Ambulatory Visit (INDEPENDENT_AMBULATORY_CARE_PROVIDER_SITE_OTHER): Payer: Managed Care, Other (non HMO) | Admitting: Nurse Practitioner

## 2022-06-13 ENCOUNTER — Ambulatory Visit
Admission: RE | Admit: 2022-06-13 | Discharge: 2022-06-13 | Disposition: A | Discharge status: Home / Self Care | Payer: Managed Care, Other (non HMO) | Source: Ambulatory Visit | Attending: Nurse Practitioner | Admitting: Nurse Practitioner

## 2022-06-13 ENCOUNTER — Other Ambulatory Visit: Payer: Self-pay | Admitting: Nurse Practitioner

## 2022-06-13 VITALS — BP 128/78 | HR 86 | Temp 97.7°F | Ht 74.0 in | Wt 209.4 lb

## 2022-06-13 DIAGNOSIS — E559 Vitamin D deficiency, unspecified: Secondary | ICD-10-CM

## 2022-06-13 DIAGNOSIS — R059 Cough, unspecified: Secondary | ICD-10-CM

## 2022-06-13 DIAGNOSIS — I2510 Atherosclerotic heart disease of native coronary artery without angina pectoris: Secondary | ICD-10-CM | POA: Diagnosis not present

## 2022-06-13 DIAGNOSIS — S6992XA Unspecified injury of left wrist, hand and finger(s), initial encounter: Secondary | ICD-10-CM

## 2022-06-13 DIAGNOSIS — E663 Overweight: Secondary | ICD-10-CM

## 2022-06-13 DIAGNOSIS — Z79899 Other long term (current) drug therapy: Secondary | ICD-10-CM

## 2022-06-13 DIAGNOSIS — I700 Atherosclerosis of aorta: Secondary | ICD-10-CM | POA: Diagnosis not present

## 2022-06-13 DIAGNOSIS — E1122 Type 2 diabetes mellitus with diabetic chronic kidney disease: Secondary | ICD-10-CM

## 2022-06-13 DIAGNOSIS — M62838 Other muscle spasm: Secondary | ICD-10-CM | POA: Diagnosis not present

## 2022-06-13 DIAGNOSIS — J40 Bronchitis, not specified as acute or chronic: Secondary | ICD-10-CM

## 2022-06-13 DIAGNOSIS — E785 Hyperlipidemia, unspecified: Secondary | ICD-10-CM

## 2022-06-13 DIAGNOSIS — E1169 Type 2 diabetes mellitus with other specified complication: Secondary | ICD-10-CM | POA: Diagnosis not present

## 2022-06-13 DIAGNOSIS — N182 Chronic kidney disease, stage 2 (mild): Secondary | ICD-10-CM

## 2022-06-13 DIAGNOSIS — I10 Essential (primary) hypertension: Secondary | ICD-10-CM

## 2022-06-13 MED ORDER — AZITHROMYCIN 250 MG PO TABS: ORAL_TABLET | ORAL | 1 refills | Status: DC

## 2022-06-13 MED ORDER — DEXAMETHASONE 4 MG PO TABS: ORAL_TABLET | ORAL | 0 refills | Status: DC

## 2022-06-13 NOTE — Addendum Note (Signed)
Addended by: Lorenda Peck E on: 06/13/2022 10:00 AM   Modules accepted: Orders

## 2022-06-14 LAB — CBC WITH DIFFERENTIAL/PLATELET
Absolute Monocytes: 580 cells/uL (ref 200–950)
Basophils Absolute: 76 cells/uL (ref 0–200)
Basophils Relative: 0.8 %
Eosinophils Absolute: 266 cells/uL (ref 15–500)
Eosinophils Relative: 2.8 %
HCT: 51 % — ABNORMAL HIGH (ref 38.5–50.0)
Hemoglobin: 17.4 g/dL — ABNORMAL HIGH (ref 13.2–17.1)
Lymphs Abs: 2622 cells/uL (ref 850–3900)
MCH: 30.1 pg (ref 27.0–33.0)
MCHC: 34.1 g/dL (ref 32.0–36.0)
MCV: 88.2 fL (ref 80.0–100.0)
MPV: 11.1 fL (ref 7.5–12.5)
Monocytes Relative: 6.1 %
Neutro Abs: 5957 cells/uL (ref 1500–7800)
Neutrophils Relative %: 62.7 %
Platelets: 235 10*3/uL (ref 140–400)
RBC: 5.78 10*6/uL (ref 4.20–5.80)
RDW: 12.8 % (ref 11.0–15.0)
Total Lymphocyte: 27.6 %
WBC: 9.5 10*3/uL (ref 3.8–10.8)

## 2022-06-14 LAB — HEMOGLOBIN A1C
Hgb A1c MFr Bld: 5.5 % of total Hgb (ref <5.7)
Mean Plasma Glucose: 111 mg/dL
eAG (mmol/L): 6.2 mmol/L

## 2022-06-14 LAB — COMPLETE METABOLIC PANEL WITH GFR
AG Ratio: 1.8 (calc) (ref 1.0–2.5)
ALT: 26 U/L (ref 9–46)
AST: 19 U/L (ref 10–35)
Albumin: 4.7 g/dL (ref 3.6–5.1)
Alkaline phosphatase (APISO): 58 U/L (ref 35–144)
BUN: 16 mg/dL (ref 7–25)
CO2: 26 mmol/L (ref 20–32)
Calcium: 9.9 mg/dL (ref 8.6–10.3)
Chloride: 104 mmol/L (ref 98–110)
Creat: 1.17 mg/dL (ref 0.70–1.30)
Globulin: 2.6 g/dL (calc) (ref 1.9–3.7)
Glucose, Bld: 112 mg/dL — ABNORMAL HIGH (ref 65–99)
Potassium: 4.5 mmol/L (ref 3.5–5.3)
Sodium: 139 mmol/L (ref 135–146)
Total Bilirubin: 0.6 mg/dL (ref 0.2–1.2)
Total Protein: 7.3 g/dL (ref 6.1–8.1)
eGFR: 75 mL/min/{1.73_m2} (ref >=60)

## 2022-06-14 LAB — LIPID PANEL
Cholesterol: 137 mg/dL (ref <200)
HDL: 37 mg/dL — ABNORMAL LOW (ref >=40)
LDL Cholesterol (Calc): 72 mg/dL (calc)
Non-HDL Cholesterol (Calc): 100 mg/dL (calc) (ref <130)
Total CHOL/HDL Ratio: 3.7 (calc) (ref <5.0)
Triglycerides: 182 mg/dL — ABNORMAL HIGH (ref <150)

## 2022-06-15 ENCOUNTER — Encounter: Payer: Self-pay | Admitting: Nurse Practitioner

## 2022-06-22 ENCOUNTER — Other Ambulatory Visit: Payer: Self-pay

## 2022-06-22 MED ORDER — OZEMPIC (1 MG/DOSE) 4 MG/3ML ~~LOC~~ SOPN: PEN_INJECTOR | SUBCUTANEOUS | 0 refills | Status: DC

## 2022-06-25 ENCOUNTER — Inpatient Hospital Stay (HOSPITAL_COMMUNITY): Payer: Managed Care, Other (non HMO)

## 2022-06-25 ENCOUNTER — Other Ambulatory Visit: Payer: Self-pay

## 2022-06-25 ENCOUNTER — Encounter (HOSPITAL_BASED_OUTPATIENT_CLINIC_OR_DEPARTMENT_OTHER): Payer: Self-pay

## 2022-06-25 ENCOUNTER — Emergency Department (HOSPITAL_BASED_OUTPATIENT_CLINIC_OR_DEPARTMENT_OTHER): Payer: Managed Care, Other (non HMO)

## 2022-06-25 ENCOUNTER — Encounter (HOSPITAL_COMMUNITY): Payer: Self-pay

## 2022-06-25 ENCOUNTER — Inpatient Hospital Stay (HOSPITAL_BASED_OUTPATIENT_CLINIC_OR_DEPARTMENT_OTHER)
Admission: EM | Admit: 2022-06-25 | Discharge: 2022-06-27 | DRG: 440 | Disposition: A | Discharge status: Home / Self Care | Payer: Managed Care, Other (non HMO) | Attending: Internal Medicine | Admitting: Internal Medicine

## 2022-06-25 DIAGNOSIS — Z79899 Other long term (current) drug therapy: Secondary | ICD-10-CM

## 2022-06-25 DIAGNOSIS — Z9852 Vasectomy status: Secondary | ICD-10-CM | POA: Diagnosis not present

## 2022-06-25 DIAGNOSIS — K760 Fatty (change of) liver, not elsewhere classified: Secondary | ICD-10-CM | POA: Diagnosis present

## 2022-06-25 DIAGNOSIS — R748 Abnormal levels of other serum enzymes: Secondary | ICD-10-CM | POA: Diagnosis not present

## 2022-06-25 DIAGNOSIS — I10 Essential (primary) hypertension: Secondary | ICD-10-CM | POA: Diagnosis present

## 2022-06-25 DIAGNOSIS — Z833 Family history of diabetes mellitus: Secondary | ICD-10-CM

## 2022-06-25 DIAGNOSIS — K7460 Unspecified cirrhosis of liver: Secondary | ICD-10-CM | POA: Diagnosis present

## 2022-06-25 DIAGNOSIS — E1129 Type 2 diabetes mellitus with other diabetic kidney complication: Secondary | ICD-10-CM | POA: Diagnosis present

## 2022-06-25 DIAGNOSIS — Z7982 Long term (current) use of aspirin: Secondary | ICD-10-CM | POA: Diagnosis not present

## 2022-06-25 DIAGNOSIS — Z87891 Personal history of nicotine dependence: Secondary | ICD-10-CM | POA: Diagnosis not present

## 2022-06-25 DIAGNOSIS — I700 Atherosclerosis of aorta: Secondary | ICD-10-CM | POA: Diagnosis present

## 2022-06-25 DIAGNOSIS — E785 Hyperlipidemia, unspecified: Secondary | ICD-10-CM | POA: Diagnosis present

## 2022-06-25 DIAGNOSIS — Z85828 Personal history of other malignant neoplasm of skin: Secondary | ICD-10-CM | POA: Diagnosis not present

## 2022-06-25 DIAGNOSIS — E1122 Type 2 diabetes mellitus with diabetic chronic kidney disease: Secondary | ICD-10-CM | POA: Diagnosis not present

## 2022-06-25 DIAGNOSIS — Z8616 Personal history of COVID-19: Secondary | ICD-10-CM | POA: Diagnosis not present

## 2022-06-25 DIAGNOSIS — E1169 Type 2 diabetes mellitus with other specified complication: Secondary | ICD-10-CM | POA: Diagnosis present

## 2022-06-25 DIAGNOSIS — K851 Biliary acute pancreatitis without necrosis or infection: Secondary | ICD-10-CM | POA: Diagnosis not present

## 2022-06-25 DIAGNOSIS — Z7985 Long-term (current) use of injectable non-insulin antidiabetic drugs: Secondary | ICD-10-CM | POA: Diagnosis not present

## 2022-06-25 DIAGNOSIS — Z8249 Family history of ischemic heart disease and other diseases of the circulatory system: Secondary | ICD-10-CM | POA: Diagnosis not present

## 2022-06-25 DIAGNOSIS — Z9049 Acquired absence of other specified parts of digestive tract: Secondary | ICD-10-CM

## 2022-06-25 DIAGNOSIS — I2510 Atherosclerotic heart disease of native coronary artery without angina pectoris: Secondary | ICD-10-CM | POA: Diagnosis present

## 2022-06-25 DIAGNOSIS — N182 Chronic kidney disease, stage 2 (mild): Secondary | ICD-10-CM | POA: Diagnosis not present

## 2022-06-25 DIAGNOSIS — R7989 Other specified abnormal findings of blood chemistry: Secondary | ICD-10-CM | POA: Diagnosis not present

## 2022-06-25 DIAGNOSIS — K859 Acute pancreatitis without necrosis or infection, unspecified: Principal | ICD-10-CM

## 2022-06-25 LAB — URINALYSIS, ROUTINE W REFLEX MICROSCOPIC
Bilirubin Urine: NEGATIVE
Glucose, UA: NEGATIVE mg/dL
Hgb urine dipstick: NEGATIVE
Ketones, ur: NEGATIVE mg/dL
Leukocytes,Ua: NEGATIVE
Nitrite: NEGATIVE
Protein, ur: NEGATIVE mg/dL
Specific Gravity, Urine: 1.01 (ref 1.005–1.030)
pH: 6.5 (ref 5.0–8.0)

## 2022-06-25 LAB — CBC
HCT: 50.4 % (ref 39.0–52.0)
Hemoglobin: 17.7 g/dL — ABNORMAL HIGH (ref 13.0–17.0)
MCH: 30.1 pg (ref 26.0–34.0)
MCHC: 35.1 g/dL (ref 30.0–36.0)
MCV: 85.7 fL (ref 80.0–100.0)
Platelets: 226 10*3/uL (ref 150–400)
RBC: 5.88 MIL/uL — ABNORMAL HIGH (ref 4.22–5.81)
RDW: 11.9 % (ref 11.5–15.5)
WBC: 21.4 10*3/uL — ABNORMAL HIGH (ref 4.0–10.5)
nRBC: 0 % (ref 0.0–0.2)

## 2022-06-25 LAB — COMPREHENSIVE METABOLIC PANEL
ALT: 45 U/L — ABNORMAL HIGH (ref 0–44)
AST: 17 U/L (ref 15–41)
Albumin: 4.3 g/dL (ref 3.5–5.0)
Alkaline Phosphatase: 50 U/L (ref 38–126)
Anion gap: 8 (ref 5–15)
BUN: 14 mg/dL (ref 6–20)
CO2: 26 mmol/L (ref 22–32)
Calcium: 9.4 mg/dL (ref 8.9–10.3)
Chloride: 101 mmol/L (ref 98–111)
Creatinine, Ser: 1.05 mg/dL (ref 0.61–1.24)
GFR, Estimated: >60 mL/min (ref >60)
Glucose, Bld: 134 mg/dL — ABNORMAL HIGH (ref 70–99)
Potassium: 3.9 mmol/L (ref 3.5–5.1)
Sodium: 135 mmol/L (ref 135–145)
Total Bilirubin: 1.6 mg/dL — ABNORMAL HIGH (ref 0.3–1.2)
Total Protein: 6.6 g/dL (ref 6.5–8.1)

## 2022-06-25 LAB — MAGNESIUM: Magnesium: 1.9 mg/dL (ref 1.7–2.4)

## 2022-06-25 LAB — LIPASE, BLOOD: Lipase: 319 U/L — ABNORMAL HIGH (ref 11–51)

## 2022-06-25 MED ORDER — GADOPICLENOL 0.5 MMOL/ML IV SOLN
9.0000 mL | Freq: Once | INTRAVENOUS | Status: AC | PRN
  Administered 2022-06-25: 9 mL via INTRAVENOUS

## 2022-06-25 MED ORDER — MORPHINE SULFATE (PF) 4 MG/ML IV SOLN
4.0000 mg | Freq: Once | INTRAVENOUS | Status: AC
  Administered 2022-06-25: 4 mg via INTRAVENOUS
  Filled 2022-06-25: qty 1

## 2022-06-25 MED ORDER — ONDANSETRON HCL 4 MG/2ML IJ SOLN
4.0000 mg | Freq: Once | INTRAMUSCULAR | Status: AC
  Administered 2022-06-25: 4 mg via INTRAVENOUS
  Filled 2022-06-25: qty 2

## 2022-06-25 MED ORDER — NALOXONE HCL 0.4 MG/ML IJ SOLN: 0.4000 mg | INTRAMUSCULAR | Status: DC | PRN

## 2022-06-25 MED ORDER — HYDROMORPHONE HCL 1 MG/ML IJ SOLN
0.5000 mg | INTRAMUSCULAR | Status: DC | PRN
  Administered 2022-06-25: 0.5 mg via INTRAVENOUS
  Filled 2022-06-25: qty 0.5

## 2022-06-25 MED ORDER — SODIUM CHLORIDE 0.9 % IV SOLN: INTRAVENOUS | Status: DC

## 2022-06-25 MED ORDER — IOHEXOL 300 MG/ML  SOLN
100.0000 mL | Freq: Once | INTRAMUSCULAR | Status: AC | PRN
  Administered 2022-06-25: 100 mL via INTRAVENOUS

## 2022-06-25 MED ORDER — SODIUM CHLORIDE 0.9 % IV BOLUS
1000.0000 mL | Freq: Once | INTRAVENOUS | Status: AC
  Administered 2022-06-25: 1000 mL via INTRAVENOUS

## 2022-06-25 MED ORDER — ACETAMINOPHEN 650 MG RE SUPP: 650.0000 mg | Freq: Four times a day (QID) | RECTAL | Status: DC | PRN

## 2022-06-25 MED ORDER — ACETAMINOPHEN 325 MG PO TABS: 650.0000 mg | ORAL_TABLET | Freq: Four times a day (QID) | ORAL | Status: DC | PRN

## 2022-06-25 MED ORDER — ONDANSETRON HCL 4 MG/2ML IJ SOLN
4.0000 mg | Freq: Four times a day (QID) | INTRAMUSCULAR | Status: DC | PRN
  Administered 2022-06-25: 4 mg via INTRAVENOUS
  Filled 2022-06-25: qty 2

## 2022-06-25 NOTE — ED Triage Notes (Signed)
Upper abdominal pain across upper quadrants.  Onset four days Vomiting several times.

## 2022-06-25 NOTE — ED Provider Notes (Signed)
[facility] EMERGENCY DEPT Provider Note   CSN: [id number] Arrival date & time: 06/25/22  1027     History  Chief Complaint  Patient presents with   Abdominal Pain    Elyjah Hazan is a 51 y.o. male.  Pt is a 51 yo male with a pmhx significant for DM, HTN, CKD, CAD, HLD, and skin cancer.  Pt has had upper abd pain since Tuesday, 10/3.  He has had pancreatitis in the past when his gallbladder became infected, but no abd issues since then.  Pt has not been able to eat or drink without feeling very nauseous and having more pain.       Home Medications Prior to Admission medications   Medication Sig Start Date End Date Taking? Authorizing Provider  albuterol (VENTOLIN HFA) 108 (90 Base) MCG/ACT inhaler Inhale 1-2 puffs into the lungs every 4 (four) hours as needed for wheezing or shortness of breath. 02/25/22   Liane Comber, NP  APPLE CIDER VINEGAR PO Take 1 capsule by mouth in the morning and at bedtime.    [provider]  aspirin EC 81 MG tablet Take 81 mg by mouth in the morning. Swallow whole.    [provider]  azithromycin (ZITHROMAX) 250 MG tablet Take 2 tablets (500 mg) on  Day 1,  followed by 1 tablet (250 mg) once daily on Days 2 through 5. 06/13/22   Alycia Rossetti, NP  Blood Glucose Monitoring Suppl (CONTOUR NEXT MONITOR) w/Device KIT Check blood sugar once daily 12/14/21   Liane Comber, NP  Cetirizine HCl (ZYRTEC PO) Take by mouth daily. Patient not taking: Reported on 06/13/2022    [provider]  Cholecalciferol 1.25 MG (50000 UT) TABS Take 1 tablet by mouth once a week.    [provider]  Cinnamon 500 MG capsule Take 500 mg by mouth 2 (two) times daily.     [provider]  dexamethasone (DECADRON) 4 MG tablet Take 3 tabs for 3 days, 2 tabs for 3 days 1 tab for 5 days. Take with food. 06/13/22   Alycia Rossetti, NP  fexofenadine Delma Freeze) 180 MG tablet Take  1 tablet  Daily  for Allergies 02/13/22    Unk Pinto, MD  glucose blood (CONTOUR NEXT TEST) test strip Check blood sugar once daily 12/14/21   Liane Comber, NP  Lancets Micro Thin 33G MISC Check blood sugar once daily 12/14/21   Liane Comber, NP  lisinopril (ZESTRIL) 5 MG tablet TAKE 1 TABLET BY MOUTH EVERY DAY FOR BLOOD PRESSURE 03/04/22   Liane Comber, NP  Multiple Vitamins-Minerals (MULTIVITAMIN MEN 50+) TABS Take by mouth daily.    [provider]  rosuvastatin (CRESTOR) 5 MG tablet Take 1 tab 4 days a week for cholesterol (MWFSat) 08/05/21   Liane Comber, NP  Semaglutide, 1 MG/DOSE, (OZEMPIC, 1 MG/DOSE,) 4 MG/3ML SOPN INJECT 1 MG (0.75 ML) INTO THE SKIN ONCE WEEKLY FOR DIABETES 06/22/22   Unk Pinto, MD  traZODone (DESYREL) 100 MG tablet TAKE 1 TABLET BY MOUTH EVERYDAY AT BEDTIME 02/25/22   Liane Comber, NP      Allergies    Patient has no known allergies.    Review of Systems   Review of Systems  Gastrointestinal:  Positive for abdominal distention, nausea and vomiting.  All other systems reviewed and are negative.   Physical Exam Updated Vital Signs BP (!) 144/82   Pulse 63   Temp 97.9 F (36.6 C) (Oral)   Resp (!) 21  Ht 6' 2"  (1.88 m)   Wt 92 kg   SpO2 99%   BMI 26.04 kg/m  Physical Exam Vitals and nursing note reviewed.  Constitutional:      Appearance: He is well-developed.  HENT:     Head: Normocephalic and atraumatic.     Mouth/Throat:     Mouth: Mucous membranes are moist.     Pharynx: Oropharynx is clear.  Eyes:     Extraocular Movements: Extraocular movements intact.     Pupils: Pupils are equal, round, and reactive to light.  Cardiovascular:     Rate and Rhythm: Normal rate and regular rhythm.     Heart sounds: Normal heart sounds.  Pulmonary:     Effort: Pulmonary effort is normal.     Breath sounds: Normal breath sounds.  Abdominal:     General: Abdomen is flat. Bowel sounds are normal.     Palpations: Abdomen is soft.     Tenderness: There is abdominal  tenderness in the epigastric area.  Skin:    General: Skin is warm.     Capillary Refill: Capillary refill takes less than 2 seconds.  Neurological:     General: No focal deficit present.     Mental Status: He is alert and oriented to person, place, and time.  Psychiatric:        Mood and Affect: Mood normal.        Behavior: Behavior normal.     ED Results / Procedures / Treatments   Labs (all labs ordered are listed, but only abnormal results are displayed) Labs Reviewed  LIPASE, BLOOD - Abnormal; Notable for the following components:      Result Value   Lipase 319 (*)    All other components within normal limits  COMPREHENSIVE METABOLIC PANEL - Abnormal; Notable for the following components:   Glucose, Bld 134 (*)    ALT 45 (*)    Total Bilirubin 1.6 (*)    All other components within normal limits  CBC - Abnormal; Notable for the following components:   WBC 21.4 (*)    RBC 5.88 (*)    Hemoglobin 17.7 (*)    All other components within normal limits  URINALYSIS, ROUTINE W REFLEX MICROSCOPIC    EKG EKG Interpretation  Date/Time:  Saturday June 25 2022 16:19:38 EDT Ventricular Rate:  57 PR Interval:  129 QRS Duration: 110 QT Interval:  439 QTC Calculation: 428 R Axis:   113 Text Interpretation: Sinus rhythm Right axis deviation No significant change since last tracing Confirmed by Isla Pence [phone]) on 06/25/2022 4:22:11 PM  Radiology CT ABDOMEN PELVIS W CONTRAST  Result Date: 06/25/2022 CLINICAL DATA:  Abdominal pain, acute nonlocalized. Central abdominal pain for 4 days with vomiting. EXAM: CT ABDOMEN AND PELVIS WITH CONTRAST TECHNIQUE: Multidetector CT imaging of the abdomen and pelvis was performed using the standard protocol following bolus administration of intravenous contrast. RADIATION DOSE REDUCTION: This exam was performed according to the departmental dose-optimization program which includes automated exposure control, adjustment of the mA and/or  kV according to patient size and/or use of iterative reconstruction technique. CONTRAST:  182m OMNIPAQUE IOHEXOL 300 MG/ML  SOLN COMPARISON:  Abdominopelvic CT 09/28/2020. FINDINGS: Lower chest: Clear lung bases. No significant pleural or pericardial effusion. Hepatobiliary: The liver is normal in density without suspicious focal abnormality. Mild contour irregularity of the liver is again noted, suspicious for cirrhosis. Ill-defined low-density adjacent to the falciform ligament on image 19/2 appears slightly more conspicuous, although is most consistent with  focal fat. No arterial phase enhancing lesions are identified. Stable mild extrahepatic biliary prominence post cholecystectomy. Pancreas: Stable 6 mm low-density lesion in the pancreatic head on image 30/2, unlikely to be clinically significant based on stability. No pancreatic ductal dilatation or surrounding inflammation. Spleen: Normal in size without focal abnormality. Adrenals/Urinary Tract: Both adrenal glands appear normal. No evidence of urinary tract calculus, suspicious renal lesion or hydronephrosis. Unchanged 1.7 cm cystic lesion in the interpolar region of the right kidney for which no follow-up imaging is recommended. The bladder appears unremarkable for its degree of distention. Stomach/Bowel: No enteric contrast administered. The stomach appears unremarkable for its degree of distension. No evidence of bowel wall thickening, distention or surrounding inflammatory change. The appendix appears normal. Mildly prominent stool in the distal colon. Vascular/Lymphatic: There are no enlarged abdominal or pelvic lymph nodes. Aortic and branch vessel atherosclerosis without evidence of aneurysm or large vessel occlusion. Reproductive: The prostate gland and seminal vesicles appear normal. Other: No evidence of abdominal wall mass or hernia. No ascites. Musculoskeletal: No acute or significant osseous findings. IMPRESSION: 1. No acute findings or  explanation for the patient's symptoms. 2. Stable mild extrahepatic biliary prominence post cholecystectomy, likely physiologic. 3. Stable mild contour irregularity of the liver, suspicious for cirrhosis. 4.  Aortic Atherosclerosis (ICD10-I70.0). Electronically Signed   By: Richardean Sale M.D.   On: 06/25/2022 16:17    Procedures Procedures    Medications Ordered in ED Medications  sodium chloride 0.9 % bolus 1,000 mL (1,000 mLs Intravenous New Bag/Given 06/25/22 1532)  morphine (PF) 4 MG/ML injection 4 mg (4 mg Intravenous Given 06/25/22 1544)  ondansetron (ZOFRAN) injection 4 mg (4 mg Intravenous Given 06/25/22 1544)  iohexol (OMNIPAQUE) 300 MG/ML solution 100 mL (100 mLs Intravenous Contrast Given 06/25/22 1554)    ED Course/ Medical Decision Making/ A&P                           Medical Decision Making Amount and/or Complexity of Data Reviewed Labs: ordered. Radiology: ordered.  Risk Prescription drug management.    This patient presents to the ED for concern of abd pain, this involves an extensive number of treatment options, and is a complaint that carries with it a high risk of complications and morbidity.  The differential diagnosis includes pancreatitis, gastritis, other infection   Co morbidities that complicate the patient evaluation  DM, HTN, CKD, CAD, HLD, and skin cancer.   Additional history obtained:  Additional history obtained from epic chart review External records from outside source obtained and reviewed including wife   Lab Tests:  I Ordered, and personally interpreted labs.  The pertinent results include:  cbc with wbc elevated at 21.4; cmp with tb elevated at 1.6; lip elevated at 319   Imaging Studies ordered:  I ordered imaging studies including ct abd/pelvis  I independently visualized and interpreted imaging which showed: IMPRESSION:  1. No acute findings or explanation for the patient's symptoms.  2. Stable mild extrahepatic biliary  prominence post cholecystectomy,  likely physiologic.  3. Stable mild contour irregularity of the liver, suspicious for  cirrhosis.  4.  Aortic Atherosclerosis (ICD10-I70.0).   I agree with the radiologist interpretation   Cardiac Monitoring:  The patient was maintained on a cardiac monitor.  I personally viewed and interpreted the cardiac monitored which showed an underlying rhythm of: nsr   Medicines ordered and prescription drug management:  I ordered medication including morphine/zofran  for pain/nausea  Reevaluation  of the patient after these medicines showed that the patient improved I have reviewed the patients home medicines and have made adjustments as needed   Test Considered:  Ct, MRI   Critical Interventions:  Symptom control, fluids   Consultations Obtained:  I requested consultation with the gastroenterologist (Dr. Benson Norway on for Dr. Carlean Purl),  and discussed lab and imaging findings as well as pertinent plan - he recommends admission for symptom control and recommends getting a MRCP. Pt d/w Dr. Karleen Hampshire ([facility]) who will admit.   Problem List / ED Course:  Abd pain:  possible acute pancreatitis with choledocholithiasis.  MRCP ordered.  Pt will be admitted.   Reevaluation:  After the interventions noted above, I reevaluated the patient and found that they have :improved   Social Determinants of Health:  Lives at home   Dispostion:  After consideration of the diagnostic results and the patients response to treatment, I feel that the patent would benefit from admission.          Final Clinical Impression(s) / ED Diagnoses Final diagnoses:  Acute pancreatitis, unspecified complication status, unspecified pancreatitis type    Rx / DC Orders ED Discharge Orders     None         Isla Pence, MD 06/25/22 1705

## 2022-06-25 NOTE — ED Notes (Signed)
Called to give report to the floor. I was asked to call back after shift change to give report on Pt.

## 2022-06-25 NOTE — Progress Notes (Signed)
51 Year old gentleman with prior h/o hypertension, CKD, hyperlipidemia and DM, h/o gall stones s/p cholecystectomy presents today to [facility] ED for nausea,vomiting and abdominal pain. Labs significant for elevated wbc count to 21,400 and elevated lipase at 319  He was found to have acute pancreatitis. CT abd pelvis shows mild extrahepatic biliary prominence, ? Retained stone vs physiological.  GI consulted by EDP, Dr Benson Norway will see patient in am.   Patient accepted to [facility] med surg bed for management of acute pancreatitis.    Hosie Poisson, MD

## 2022-06-26 DIAGNOSIS — K760 Fatty (change of) liver, not elsewhere classified: Secondary | ICD-10-CM

## 2022-06-26 DIAGNOSIS — E1122 Type 2 diabetes mellitus with diabetic chronic kidney disease: Secondary | ICD-10-CM

## 2022-06-26 DIAGNOSIS — N182 Chronic kidney disease, stage 2 (mild): Secondary | ICD-10-CM

## 2022-06-26 DIAGNOSIS — K851 Biliary acute pancreatitis without necrosis or infection: Principal | ICD-10-CM

## 2022-06-26 DIAGNOSIS — I2510 Atherosclerotic heart disease of native coronary artery without angina pectoris: Secondary | ICD-10-CM | POA: Diagnosis not present

## 2022-06-26 DIAGNOSIS — I10 Essential (primary) hypertension: Secondary | ICD-10-CM | POA: Diagnosis not present

## 2022-06-26 DIAGNOSIS — E785 Hyperlipidemia, unspecified: Secondary | ICD-10-CM

## 2022-06-26 DIAGNOSIS — E1169 Type 2 diabetes mellitus with other specified complication: Secondary | ICD-10-CM

## 2022-06-26 LAB — CBC WITH DIFFERENTIAL/PLATELET
Abs Immature Granulocytes: 0.09 10*3/uL — ABNORMAL HIGH (ref 0.00–0.07)
Basophils Absolute: 0.1 10*3/uL (ref 0.0–0.1)
Basophils Relative: 0 %
Eosinophils Absolute: 0.3 10*3/uL (ref 0.0–0.5)
Eosinophils Relative: 2 %
HCT: 48.3 % (ref 39.0–52.0)
Hemoglobin: 16.1 g/dL (ref 13.0–17.0)
Immature Granulocytes: 1 %
Lymphocytes Relative: 14 %
Lymphs Abs: 2 10*3/uL (ref 0.7–4.0)
MCH: 29.7 pg (ref 26.0–34.0)
MCHC: 33.3 g/dL (ref 30.0–36.0)
MCV: 89 fL (ref 80.0–100.0)
Monocytes Absolute: 1.2 10*3/uL — ABNORMAL HIGH (ref 0.1–1.0)
Monocytes Relative: 8 %
Neutro Abs: 11.2 10*3/uL — ABNORMAL HIGH (ref 1.7–7.7)
Neutrophils Relative %: 75 %
Platelets: 182 10*3/uL (ref 150–400)
RBC: 5.43 MIL/uL (ref 4.22–5.81)
RDW: 12.3 % (ref 11.5–15.5)
WBC: 14.9 10*3/uL — ABNORMAL HIGH (ref 4.0–10.5)
nRBC: 0 % (ref 0.0–0.2)

## 2022-06-26 LAB — COMPREHENSIVE METABOLIC PANEL
ALT: 774 U/L — ABNORMAL HIGH (ref 0–44)
AST: 640 U/L — ABNORMAL HIGH (ref 15–41)
Albumin: 3.4 g/dL — ABNORMAL LOW (ref 3.5–5.0)
Alkaline Phosphatase: 116 U/L (ref 38–126)
Anion gap: 8 (ref 5–15)
BUN: 16 mg/dL (ref 6–20)
CO2: 26 mmol/L (ref 22–32)
Calcium: 8.9 mg/dL (ref 8.9–10.3)
Chloride: 102 mmol/L (ref 98–111)
Creatinine, Ser: 1.03 mg/dL (ref 0.61–1.24)
GFR, Estimated: >60 mL/min (ref >60)
Glucose, Bld: 137 mg/dL — ABNORMAL HIGH (ref 70–99)
Potassium: 4.5 mmol/L (ref 3.5–5.1)
Sodium: 136 mmol/L (ref 135–145)
Total Bilirubin: 6.6 mg/dL — ABNORMAL HIGH (ref 0.3–1.2)
Total Protein: 6.1 g/dL — ABNORMAL LOW (ref 6.5–8.1)

## 2022-06-26 LAB — PROTIME-INR
INR: 1.1 (ref 0.8–1.2)
Prothrombin Time: 14.6 seconds (ref 11.4–15.2)

## 2022-06-26 LAB — MAGNESIUM: Magnesium: 2.3 mg/dL (ref 1.7–2.4)

## 2022-06-26 LAB — LACTATE DEHYDROGENASE: LDH: 407 U/L — ABNORMAL HIGH (ref 98–192)

## 2022-06-26 MED ORDER — OXYCODONE HCL 5 MG PO TABS: 5.0000 mg | ORAL_TABLET | ORAL | Status: DC | PRN

## 2022-06-26 MED ORDER — ASPIRIN 81 MG PO TBEC
81.0000 mg | DELAYED_RELEASE_TABLET | Freq: Every day | ORAL | Status: DC
  Administered 2022-06-26 – 2022-06-27 (×2): 81 mg via ORAL
  Filled 2022-06-26 (×2): qty 1

## 2022-06-26 MED ORDER — SODIUM CHLORIDE 0.9 % IV BOLUS
1000.0000 mL | Freq: Once | INTRAVENOUS | Status: AC
  Administered 2022-06-26: 1000 mL via INTRAVENOUS

## 2022-06-26 MED ORDER — HYDROMORPHONE HCL 1 MG/ML IJ SOLN: 0.5000 mg | INTRAMUSCULAR | Status: DC | PRN

## 2022-06-26 NOTE — Assessment & Plan Note (Signed)
Hold Crestor 

## 2022-06-26 NOTE — Assessment & Plan Note (Signed)
In the setting of epigastric pain and elevated lipase, however MRCP and CT pancreas showed no pancreatic inflammation. - N.p.o. for now - Continue IV fluids

## 2022-06-26 NOTE — Progress Notes (Signed)
  Carryover admission to the Day Admitter.  Transfer from dwb for acute pancreatitis. Please see Dr. Petra Kuba progress note and [facility] Communication for additional transfer documentation. Accepted for inpatient to med-surg bed.   I have placed some additional preliminary admit orders via the adult multi-morbid admission order set. I have also ordered  npo, continuous NS, prn IV Dilaudid, prn IV Zofran. Dr. Benson Norway of gi to consult in the AM. MRCP pending. AM labs ordered.     Babs Bertin, DO Hospitalist

## 2022-06-26 NOTE — Consult Note (Signed)
CONSULT NOTE FOR [facility]  Reason for Consult: Abdominal pain and abnormal liver enzymes Referring Physician: [facility]  Gregory Jacobs HPI: This is a 51 year old male with a PMH of MAFLD, CAD, DM, and s/p lap chole admitted for complaints of abdominal pain and abnormal liver enzymes.  His symptoms started acutely last week on Tuesday.  His pain was located in the upper abdomen and it was associated with PO intake.  His PO intake was decreased, but as the week progressed he did not feel any better.  He presented to the [facility] and his blood work was notable for a lipase of 319, ALT of 45, and a WBC of 21.4.  The patient does not recall any sick contacts and he does not feel that he ate anything to induce his illness.  Imaging of the abdomen was negative for any acute findings, however, he was noted to have cirrhosis.  The patient was aware of his cirrhosis when he was diagnosed with COVID on year ago.  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  He worked hard to lose 30-40 lbs.  Since being admitted he clinically feels better, but he did not try to eat.  The MRCP was negative for any retained stones or biliary ductal dilation.  However, today his liver enzymes markedly increased:  AST 640, ALT 774, and TB 6.6.  He does not report using any herbal supplements, but he recently finished a dexamethasone taper for an URI the week before last week.  In 2021 and 2022 he tested negative for HBV and HCV.  The patient rarely drinks ETOH.  Past Medical History:  Diagnosis Date   2-vessel coronary artery disease 11/20/2020   Acute respiratory failure with hypoxia (HCC)    Allergy    Basal cell carcinoma (BCC) of skin of nose 03/11/2021   Chronic kidney disease    COVID-19    Diabetes mellitus without complication ([place]) 8841   Fatty liver    Hyperlipidemia    Hypertension 2012   Pancreatitis    Pneumonia due to COVID-19 virus 04/24/2020    Prostatitis 02/08/2018    Past Surgical History:  Procedure Laterality Date   CHOLECYSTECTOMY, LAPAROSCOPIC  2011   VASECTOMY  2016    Family History  Problem Relation Age of Onset   Breast cancer Mother    Hypertension Father    Heart attack Father 36   Suicidality Sister 17   Breast cancer Maternal Aunt 60   Diabetes Maternal Aunt    Diabetes Paternal Uncle     Social History:  reports that he quit smoking about 8 years ago. His smoking use included cigarettes. He started smoking about 27 years ago. He has a 9.50 pack-year smoking history. His smokeless tobacco use includes chew. He reports current alcohol use. He reports that he does not use drugs.  Allergies: No Known Allergies  Medications: Scheduled:  aspirin EC  81 mg Oral Daily   Continuous:  sodium chloride 150 mL/hr at 06/26/22 0830    Results for orders placed or performed during the hospital encounter of 06/25/22 (from the past 24 hour(s))  Urinalysis, Routine w reflex microscopic Urine, Clean Catch     Status: None   Collection Time: 06/25/22  3:46 PM  Result Value Ref Range   Color, Urine YELLOW YELLOW   APPearance CLEAR CLEAR   Specific Gravity, Urine 1.010 1.005 - 1.030   pH 6.5 5.0 -  8.0   Glucose, UA NEGATIVE NEGATIVE mg/dL   Hgb urine dipstick NEGATIVE NEGATIVE   Bilirubin Urine NEGATIVE NEGATIVE   Ketones, ur NEGATIVE NEGATIVE mg/dL   Protein, ur NEGATIVE NEGATIVE mg/dL   Nitrite NEGATIVE NEGATIVE   Leukocytes,Ua NEGATIVE NEGATIVE  Magnesium     Status: None   Collection Time: 06/25/22  9:47 PM  Result Value Ref Range   Magnesium 1.9 1.7 - 2.4 mg/dL  CBC with Differential/Platelet     Status: Abnormal   Collection Time: 06/26/22  5:14 AM  Result Value Ref Range   WBC 14.9 (H) 4.0 - 10.5 K/uL   RBC 5.43 4.22 - 5.81 MIL/uL   Hemoglobin 16.1 13.0 - 17.0 g/dL   HCT 48.3 39.0 - 52.0 %   MCV 89.0 80.0 - 100.0 fL   MCH 29.7 26.0 - 34.0 pg   MCHC 33.3 30.0 - 36.0 g/dL   RDW 12.3 11.5 - 15.5 %    Platelets 182 150 - 400 K/uL   nRBC 0.0 0.0 - 0.2 %   Neutrophils Relative % 75 %   Neutro Abs 11.2 (H) 1.7 - 7.7 K/uL   Lymphocytes Relative 14 %   Lymphs Abs 2.0 0.7 - 4.0 K/uL   Monocytes Relative 8 %   Monocytes Absolute 1.2 (H) 0.1 - 1.0 K/uL   Eosinophils Relative 2 %   Eosinophils Absolute 0.3 0.0 - 0.5 K/uL   Basophils Relative 0 %   Basophils Absolute 0.1 0.0 - 0.1 K/uL   Immature Granulocytes 1 %   Abs Immature Granulocytes 0.09 (H) 0.00 - 0.07 K/uL  Comprehensive metabolic panel     Status: Abnormal   Collection Time: 06/26/22  5:14 AM  Result Value Ref Range   Sodium 136 135 - 145 mmol/L   Potassium 4.5 3.5 - 5.1 mmol/L   Chloride 102 98 - 111 mmol/L   CO2 26 22 - 32 mmol/L   Glucose, Bld 137 (H) 70 - 99 mg/dL   BUN 16 6 - 20 mg/dL   Creatinine, Ser 1.03 0.61 - 1.24 mg/dL   Calcium 8.9 8.9 - 10.3 mg/dL   Total Protein 6.1 (L) 6.5 - 8.1 g/dL   Albumin 3.4 (L) 3.5 - 5.0 g/dL   AST 640 (H) 15 - 41 U/L   ALT 774 (H) 0 - 44 U/L   Alkaline Phosphatase 116 38 - 126 U/L   Total Bilirubin 6.6 (H) 0.3 - 1.2 mg/dL   GFR, Estimated >60 >60 mL/min   Anion gap 8 5 - 15  Magnesium     Status: None   Collection Time: 06/26/22  5:14 AM  Result Value Ref Range   Magnesium 2.3 1.7 - 2.4 mg/dL  Protime-INR     Status: None   Collection Time: 06/26/22  5:14 AM  Result Value Ref Range   Prothrombin Time 14.6 11.4 - 15.2 seconds   INR 1.1 0.8 - 1.2  Lactate dehydrogenase     Status: Abnormal   Collection Time: 06/26/22  5:14 AM  Result Value Ref Range   LDH 407 (H) 98 - 192 U/L     MR ABDOMEN MRCP W WO CONTAST  Result Date: 06/25/2022 CLINICAL DATA:  Pancreatitis EXAM: MRI ABDOMEN WITHOUT AND WITH CONTRAST (INCLUDING MRCP) TECHNIQUE: Multiplanar multisequence MR imaging of the abdomen was performed both before and after the administration of intravenous contrast. Heavily T2-weighted images of the biliary and pancreatic ducts were obtained, and three-dimensional MRCP images were  rendered by post processing. CONTRAST:  9 mL Vueway IV COMPARISON:  CT abdomen/pelvis dated 06/25/2022 FINDINGS: Lower chest: Lung bases are clear. Hepatobiliary: Nodular hepatic contour (series 4/image 19), suggesting mild cirrhosis. Mild hepatic steatosis. No suspicious/enhancing hepatic lesions. Status post cholecystectomy. No intrahepatic or extrahepatic ductal dilatation. Pancreas: No peripancreatic inflammatory changes to confirm the diagnosis of acute pancreatitis. 5 mm cyst at the pancreatic tail (series 4/image 24) and 7 mm unilocular cyst in the pancreatic head (series 4/image 28), likely reflecting small pseudocysts or side branch IPMNs, benign. No main pancreatic ductal dilatation. No well-defined fluid collection or walled-off necrosis. Spleen:  Within normal limits. Adrenals/Urinary Tract:  Adrenal glands are within normal limits. Right renal cysts, measuring up to 2.0 cm in the right upper pole (series 4/image 29), benign (Bosniak I). Left kidney is within normal limits. No hydronephrosis. Stomach/Bowel: Stomach and visualized bowel are unremarkable. Vascular/Lymphatic:  No evidence of abdominal aortic aneurysm. No suspicious abdominal lymphadenopathy. Other:  No abdominal ascites. Musculoskeletal: No focal osseous lesions. IMPRESSION: No MR findings to confirm the diagnosis of acute pancreatitis. Mild cirrhosis. Mild hepatic steatosis. No suspicious/enhancing hepatic lesions. Additional ancillary findings as above. Electronically Signed   By: Julian Hy M.D.   On: 06/25/2022 21:30   MR 3D Recon At Scanner  Result Date: 06/25/2022 CLINICAL DATA:  Pancreatitis EXAM: MRI ABDOMEN WITHOUT AND WITH CONTRAST (INCLUDING MRCP) TECHNIQUE: Multiplanar multisequence MR imaging of the abdomen was performed both before and after the administration of intravenous contrast. Heavily T2-weighted images of the biliary and pancreatic ducts were obtained, and three-dimensional MRCP images were rendered by post  processing. CONTRAST:  9 mL Vueway IV COMPARISON:  CT abdomen/pelvis dated 06/25/2022 FINDINGS: Lower chest: Lung bases are clear. Hepatobiliary: Nodular hepatic contour (series 4/image 19), suggesting mild cirrhosis. Mild hepatic steatosis. No suspicious/enhancing hepatic lesions. Status post cholecystectomy. No intrahepatic or extrahepatic ductal dilatation. Pancreas: No peripancreatic inflammatory changes to confirm the diagnosis of acute pancreatitis. 5 mm cyst at the pancreatic tail (series 4/image 24) and 7 mm unilocular cyst in the pancreatic head (series 4/image 28), likely reflecting small pseudocysts or side branch IPMNs, benign. No main pancreatic ductal dilatation. No well-defined fluid collection or walled-off necrosis. Spleen:  Within normal limits. Adrenals/Urinary Tract:  Adrenal glands are within normal limits. Right renal cysts, measuring up to 2.0 cm in the right upper pole (series 4/image 29), benign (Bosniak I). Left kidney is within normal limits. No hydronephrosis. Stomach/Bowel: Stomach and visualized bowel are unremarkable. Vascular/Lymphatic:  No evidence of abdominal aortic aneurysm. No suspicious abdominal lymphadenopathy. Other:  No abdominal ascites. Musculoskeletal: No focal osseous lesions. IMPRESSION: No MR findings to confirm the diagnosis of acute pancreatitis. Mild cirrhosis. Mild hepatic steatosis. No suspicious/enhancing hepatic lesions. Additional ancillary findings as above. Electronically Signed   By: Julian Hy M.D.   On: 06/25/2022 21:30   CT ABDOMEN PELVIS W CONTRAST  Result Date: 06/25/2022 CLINICAL DATA:  Abdominal pain, acute nonlocalized. Central abdominal pain for 4 days with vomiting. EXAM: CT ABDOMEN AND PELVIS WITH CONTRAST TECHNIQUE: Multidetector CT imaging of the abdomen and pelvis was performed using the standard protocol following bolus administration of intravenous contrast. RADIATION DOSE REDUCTION: This exam was performed according to the  departmental dose-optimization program which includes automated exposure control, adjustment of the mA and/or kV according to patient size and/or use of iterative reconstruction technique. CONTRAST:  134m OMNIPAQUE IOHEXOL 300 MG/ML  SOLN COMPARISON:  Abdominopelvic CT 09/28/2020. FINDINGS: Lower chest: Clear lung bases. No significant pleural or pericardial effusion. Hepatobiliary: The liver is normal  in density without suspicious focal abnormality. Mild contour irregularity of the liver is again noted, suspicious for cirrhosis. Ill-defined low-density adjacent to the falciform ligament on image 19/2 appears slightly more conspicuous, although is most consistent with focal fat. No arterial phase enhancing lesions are identified. Stable mild extrahepatic biliary prominence post cholecystectomy. Pancreas: Stable 6 mm low-density lesion in the pancreatic head on image 30/2, unlikely to be clinically significant based on stability. No pancreatic ductal dilatation or surrounding inflammation. Spleen: Normal in size without focal abnormality. Adrenals/Urinary Tract: Both adrenal glands appear normal. No evidence of urinary tract calculus, suspicious renal lesion or hydronephrosis. Unchanged 1.7 cm cystic lesion in the interpolar region of the right kidney for which no follow-up imaging is recommended. The bladder appears unremarkable for its degree of distention. Stomach/Bowel: No enteric contrast administered. The stomach appears unremarkable for its degree of distension. No evidence of bowel wall thickening, distention or surrounding inflammatory change. The appendix appears normal. Mildly prominent stool in the distal colon. Vascular/Lymphatic: There are no enlarged abdominal or pelvic lymph nodes. Aortic and branch vessel atherosclerosis without evidence of aneurysm or large vessel occlusion. Reproductive: The prostate gland and seminal vesicles appear normal. Other: No evidence of abdominal wall mass or hernia. No  ascites. Musculoskeletal: No acute or significant osseous findings. IMPRESSION: 1. No acute findings or explanation for the patient's symptoms. 2. Stable mild extrahepatic biliary prominence post cholecystectomy, likely physiologic. 3. Stable mild contour irregularity of the liver, suspicious for cirrhosis. 4.  Aortic Atherosclerosis (ICD10-I70.0). Electronically Signed   By: Richardean Sale M.D.   On: 06/25/2022 16:17    ROS:  As stated above in the HPI otherwise negative.  Blood pressure (!) 150/96, pulse 83, temperature 97.6 F (36.4 C), temperature source Oral, resp. rate 16, height '6\' 2"'$  (1.88 m), weight 92 kg, SpO2 96 %.    PE: Gen: NAD, Alert and Oriented HEENT:  [place]/AT, EOMI Neck: Supple, no LAD Lungs: CTA Bilaterally CV: RRR without M/G/R ABD: Soft, NTND, +BS Ext: No C/C/E  Assessment/Plan: 1) Abnormal liver enzymes. 2) MAFLD cirrhosis. 3) Abdominal pain - ? Resolved.   The marked elevation in his liver enzymes was surprising.  Yesterday's work up was negative.  The MRCP did not show any retained stones.  He had an URI two weeks ago, but he felt well otherwise.  The etiology of his acute rise in liver enzymes and TB is unknown.  Clinically he is stable and there is no evidence of any hepatic encephalopathy to suggest acute liver failure.  Prior to this time his liver enzymes were normal since losing weight.  ? Transient viral infection.  No medications can be identified to be the culprit.  Plan: 1) Follow liver enzymes. 2) Advance to a regular diet.  He can pick and choose what he wants to eat. 3) [facility] will assume care in the AM.  Marguriete Wootan D 06/26/2022, 2:58 PM

## 2022-06-26 NOTE — Assessment & Plan Note (Signed)
Blood pressure normal - Hold lisinopril

## 2022-06-26 NOTE — Hospital Course (Addendum)
Mr. Kassa is a 51 y.o. M with DM, HTN, CAD, fatty liver, hx gallstones s/p cholecystectomy who presented with abdominal pain, N/V for 3-4 days, progressively worse.  In the ER, CT showed cirrhosis, no acute findings.  Given concern for CBD stone, case discussed with Dr. Benson Norway.  Lipase 319, AST/ALT slightly elevated.   WBC 21K, glucose normal.   10/8: Admitted on IV fluids, analgesics for suspected pancreatitis 10/9: Overnight Tbili up to 6, AST/ALT >600. GI consulted

## 2022-06-26 NOTE — Assessment & Plan Note (Addendum)
-   Hold Crestor given LFTs -Hold aspirin and lisinopril until GI plan clear

## 2022-06-26 NOTE — Assessment & Plan Note (Signed)
Denies alcohol.  Only new medicines recently are Ozempic for the last few months, and dexamethasone last week.  He was prescribed azithromycin but did not take it.  No excessive Tylenol use.  No new sexual partners.  Looks like an obstructive pattern.  No fever to suggest cholangitis, MRCP unrevealing. - Consult gastroenterology, appreciate differential and work-up - Trend LFTs

## 2022-06-26 NOTE — H&P (Signed)
History and Physical    Patient: Gregory Jacobs CZY:[id number] DOB: [birth year] DOA: 06/25/2022 DOS: the patient was seen and examined on 06/26/2022 PCP: Unk Pinto, MD  Patient coming from: Home  Chief Complaint:  Chief Complaint  Patient presents with   Abdominal Pain       HPI:  Gregory Jacobs is a 51 y.o. M with DM, HTN, CAD, fatty liver, hx gallstones s/p cholecystectomy who presented with abdominal pain, N/V for 3-4 days, progressively worse.  In the ER, CT showed cirrhosis, no acute findings.  Given concern for CBD stone, case discussed with Dr. Benson Norway.  Lipase 319, AST/ALT slightly elevated.   WBC 21K, glucose normal.   10/8: Admitted on IV fluids, analgesics for suspected pancreatitis 10/9: Overnight Tbili up to 6, AST/ALT >600. GI consulted      Review of Systems  Constitutional:  Negative for chills, fever and malaise/fatigue.  Gastrointestinal:  Positive for abdominal pain, nausea and vomiting.  All other systems reviewed and are negative.    Past Medical History:  Diagnosis Date   2-vessel coronary artery disease 11/20/2020   Acute respiratory failure with hypoxia (HCC)    Allergy    Basal cell carcinoma (BCC) of skin of nose 03/11/2021   Chronic kidney disease    COVID-19    Diabetes mellitus without complication ([place]) 0932   Fatty liver    Hyperlipidemia    Hypertension 2012   Pancreatitis    Pneumonia due to COVID-19 virus 04/24/2020   Prostatitis 02/08/2018   Past Surgical History:  Procedure Laterality Date   CHOLECYSTECTOMY, LAPAROSCOPIC  2011   VASECTOMY  2016   Social History:  reports that he quit smoking about 8 years ago. His smoking use included cigarettes. He started smoking about 27 years ago. He has a 9.50 pack-year smoking history. His smokeless tobacco use includes chew. He reports current alcohol use. He reports that he does not use drugs.  Denies alcohol.  No Known Allergies  Family History  Problem Relation Age of Onset   Breast  cancer Mother    Hypertension Father    Heart attack Father 27   Suicidality Sister 106   Breast cancer Maternal Aunt 60   Diabetes Maternal Aunt    Diabetes Paternal Uncle     Prior to Admission medications   Medication Sig Start Date End Date Taking? Authorizing Provider  albuterol (VENTOLIN HFA) 108 (90 Base) MCG/ACT inhaler Inhale 1-2 puffs into the lungs every 4 (four) hours as needed for wheezing or shortness of breath. 02/25/22  Yes Liane Comber, NP  APPLE CIDER VINEGAR PO Take 1 capsule by mouth in the morning and at bedtime.   Yes [provider]  aspirin EC 81 MG tablet Take 81 mg by mouth in the morning. Swallow whole.   Yes [provider]  Blood Glucose Monitoring Suppl (CONTOUR NEXT MONITOR) w/Device KIT Check blood sugar once daily 12/14/21  Yes Corbett, Caryl Pina, NP  Cetirizine HCl (ZYRTEC PO) Take by mouth daily.   Yes [provider]  Cholecalciferol 1.25 MG (50000 UT) TABS Take 1 tablet by mouth once a week.   Yes [provider]  Cinnamon 500 MG capsule Take 500 mg by mouth 2 (two) times daily.    Yes [provider]  dexamethasone (DECADRON) 4 MG tablet Take 3 tabs for 3 days, 2 tabs for 3 days 1 tab for 5 days. Take with food. 06/13/22  Yes Alycia Rossetti, NP  glucose blood (CONTOUR NEXT TEST) test  strip Check blood sugar once daily 12/14/21  Yes Corbett, Caryl Pina, NP  Lancets Micro Thin 33G MISC Check blood sugar once daily 12/14/21  Yes Corbett, Caryl Pina, NP  lisinopril (ZESTRIL) 5 MG tablet TAKE 1 TABLET BY MOUTH EVERY DAY FOR BLOOD PRESSURE 03/04/22  Yes Liane Comber, NP  Multiple Vitamins-Minerals (MULTIVITAMIN MEN 50+) TABS Take by mouth daily.   Yes [provider]  rosuvastatin (CRESTOR) 5 MG tablet Take 1 tab 4 days a week for cholesterol (MWFSat) 08/05/21  Yes Liane Comber, NP  Semaglutide, 1 MG/DOSE, (OZEMPIC, 1 MG/DOSE,) 4 MG/3ML SOPN INJECT 1 MG (0.75 ML) INTO THE SKIN ONCE WEEKLY FOR DIABETES 06/22/22  Yes  Unk Pinto, MD  traZODone (DESYREL) 100 MG tablet TAKE 1 TABLET BY MOUTH EVERYDAY AT BEDTIME Patient taking differently: Take 100 mg by mouth at bedtime as needed for sleep. TAKE 1 TABLET BY MOUTH EVERYDAY AT BEDTIME 02/25/22  Yes Liane Comber, NP  azithromycin (ZITHROMAX) 250 MG tablet Take 2 tablets (500 mg) on  Day 1,  followed by 1 tablet (250 mg) once daily on Days 2 through 5. Patient not taking: Reported on 06/26/2022 06/13/22   Alycia Rossetti, NP    Physical Exam: Vitals:   06/25/22 2318 06/26/22 0100 06/26/22 0451 06/26/22 0915  BP: 131/82 130/81 127/80 130/89  Pulse: 85 90 82 73  Resp: 18 18 16    Temp: (!) 97.5 F (36.4 C) 99.1 F (37.3 C) 98.2 F (36.8 C) 98.3 F (36.8 C)  TempSrc: Oral Oral Oral Oral  SpO2: 97% 96% 95% 96%  Weight:      Height:       Well-nourished adult male, lying in bed, interactive Sclera icteric, lids and lashes normal.  No nasal deformity, discharge, no epistaxis, oropharynx moist, no oral lesions, dentition and lips normal Heart rate regular, no murmurs, no peripheral edema, no JVD Respiratory rate normal, lungs clear without rales or wheezes Skin without significant rashes Abdomen soft without tenderness palpation, no ascites or distention Attention normal, affect appropriate, judgment insight appear normal Face symmetric, speech fluent, moves all extremities with normal strength and coordination    Data Reviewed: Discussed with gastroenterology Comprehensive metabolic panel shows normal renal function, normal electrolytes, elevated AST/ALT, and total bilirubin Hemogram shows resolving leukocytosis, no anemia INR normal MRCP unremarkable CT abdomen and pelvis unremarkable Lipase elevated    Assessment and Plan: * Hyperbilirubinemia Denies alcohol.  Only new medicines recently are Ozempic for the last few months, and dexamethasone last week.  He was prescribed azithromycin but did not take it.  No excessive Tylenol use.  No  new sexual partners.  Looks like an obstructive pattern.  No fever to suggest cholangitis, MRCP unrevealing. - Consult gastroenterology, appreciate differential and work-up - Trend LFTs  Acute gallstone pancreatitis In the setting of epigastric pain and elevated lipase, however MRCP and CT pancreas showed no pancreatic inflammation. - N.p.o. 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 - Hold Ozempic - Defer SS corrections for now  Hyperlipidemia associated with type 2 diabetes mellitus ([place]) Hold Crestor  Essential hypertension Blood pressure normal - Hold lisinopril         Advance Care Planning: Full code  Consults: Gastroenterology, Dr. Benson Norway  Family Communication: Wife at the bedside  Severity of Illness:  The appropriate patient status for this patient is INPATIENT. Inpatient status is judged to be reasonable and necessary in order to provide the required intensity of service to ensure the patient's safety. The patient's presenting symptoms, physical exam findings, and initial radiographic and laboratory data in the context of their chronic comorbidities is felt to place them at high risk for further clinical deterioration. Furthermore, it is not anticipated that the patient will be medically stable for discharge from the hospital within 2 midnights of admission.   * I certify that at the point of admission it is my clinical judgment that the patient will require inpatient hospital care spanning beyond 2 midnights from the point of admission due to high intensity of service, high risk for further deterioration and high frequency of surveillance required.*  Author: Edwin Dada, MD 06/26/2022 9:53 AM  For on call review [email or web address].

## 2022-06-26 NOTE — Assessment & Plan Note (Addendum)
A1c 5.5%, on Ozempic. - Hold Ozempic - Defer SS corrections for now

## 2022-06-27 DIAGNOSIS — R748 Abnormal levels of other serum enzymes: Secondary | ICD-10-CM

## 2022-06-27 DIAGNOSIS — R7989 Other specified abnormal findings of blood chemistry: Secondary | ICD-10-CM

## 2022-06-27 LAB — COMPREHENSIVE METABOLIC PANEL
ALT: 508 U/L — ABNORMAL HIGH (ref 0–44)
AST: 156 U/L — ABNORMAL HIGH (ref 15–41)
Albumin: 3.3 g/dL — ABNORMAL LOW (ref 3.5–5.0)
Alkaline Phosphatase: 104 U/L (ref 38–126)
Anion gap: 6 (ref 5–15)
BUN: 15 mg/dL (ref 6–20)
CO2: 24 mmol/L (ref 22–32)
Calcium: 8.8 mg/dL — ABNORMAL LOW (ref 8.9–10.3)
Chloride: 105 mmol/L (ref 98–111)
Creatinine, Ser: 0.93 mg/dL (ref 0.61–1.24)
GFR, Estimated: >60 mL/min (ref >60)
Glucose, Bld: 142 mg/dL — ABNORMAL HIGH (ref 70–99)
Potassium: 4.7 mmol/L (ref 3.5–5.1)
Sodium: 135 mmol/L (ref 135–145)
Total Bilirubin: 2.2 mg/dL — ABNORMAL HIGH (ref 0.3–1.2)
Total Protein: 6.1 g/dL — ABNORMAL LOW (ref 6.5–8.1)

## 2022-06-27 LAB — CBC
HCT: 47.8 % (ref 39.0–52.0)
Hemoglobin: 16 g/dL (ref 13.0–17.0)
MCH: 29.9 pg (ref 26.0–34.0)
MCHC: 33.5 g/dL (ref 30.0–36.0)
MCV: 89.3 fL (ref 80.0–100.0)
Platelets: 145 10*3/uL — ABNORMAL LOW (ref 150–400)
RBC: 5.35 MIL/uL (ref 4.22–5.81)
RDW: 12.1 % (ref 11.5–15.5)
WBC: 10.9 10*3/uL — ABNORMAL HIGH (ref 4.0–10.5)
nRBC: 0 % (ref 0.0–0.2)

## 2022-06-27 LAB — PROTIME-INR
INR: 1.1 (ref 0.8–1.2)
Prothrombin Time: 13.8 seconds (ref 11.4–15.2)

## 2022-06-27 NOTE — TOC CM/SW Note (Signed)
  Transition of Care [facility]) Screening Note   Patient Details  Name: Thaddeus Evitts Date of Birth: Mar 13, 1971   [facility] of [facility]) CM/SW Contact:    Ross Ludwig, LCSW Phone Number: 06/27/2022, 2:48 PM    Transition of Care Department [facility]) has reviewed patient and no TOC needs have been identified at this time. We will continue to monitor patient advancement through interdisciplinary progression rounds. If new patient transition needs arise, please place a TOC consult.

## 2022-06-27 NOTE — Discharge Instructions (Signed)
Follow with Unk Pinto, MD in 5-7 days  Hold Ozempic for the next week, if you feel well you can resume  Please get a complete blood count and chemistry panel checked by your Primary MD at your next visit, and again as instructed by your Primary MD. Please get your medications reviewed and adjusted by your Primary MD.  Please request your Primary MD to go over all Hospital Tests and Procedure/Radiological results at the follow up, please get all Hospital records sent to your Prim MD by signing hospital release before you go home.  In some cases, there will be blood work, cultures and biopsy results pending at the time of your discharge. Please request that your primary care M.D. goes through all the records of your hospital data and follows up on these results.  If you had Pneumonia of Lung problems at the Hospital: Please get a 2 view Chest X ray done in 6-8 weeks after hospital discharge or sooner if instructed by your Primary MD.  If you have Congestive Heart Failure: Please call your Cardiologist or Primary MD anytime you have any of the following symptoms:  1) 3 pound weight gain in 24 hours or 5 pounds in 1 week  2) shortness of breath, with or without a dry hacking cough  3) swelling in the hands, feet or stomach  4) if you have to sleep on extra pillows at night in order to breathe  Follow cardiac low salt diet and 1.5 lit/day fluid restriction.  If you have diabetes Accuchecks 4 times/day, Once in AM empty stomach and then before each meal. Log in all results and show them to your primary doctor at your next visit. If any glucose reading is under 80 or above 300 call your primary MD immediately.  If you have Seizure/Convulsions/Epilepsy: Please do not drive, operate heavy machinery, participate in activities at heights or participate in high speed sports until you have seen by Primary MD or a Neurologist and advised to do so again. Per [facility] statutes, patients  with seizures are not allowed to drive until they have been seizure-free for six months.  Use caution when using heavy equipment or power tools. Avoid working on ladders or at heights. Take showers instead of baths. Ensure the water temperature is not too high on the home water heater. Do not go swimming alone. Do not lock yourself in a room alone (i.e. bathroom). When caring for infants or small children, sit down when holding, feeding, or changing them to minimize risk of injury to the child in the event you have a seizure. Maintain good sleep hygiene. Avoid alcohol.   If you had Gastrointestinal Bleeding: Please ask your Primary MD to check a complete blood count within one week of discharge or at your next visit. Your endoscopic/colonoscopic biopsies that are pending at the time of discharge, will also need to followed by your Primary MD.  Get Medicines reviewed and adjusted. Please take all your medications with you for your next visit with your Primary MD  Please request your Primary MD to go over all hospital tests and procedure/radiological results at the follow up, please ask your Primary MD to get all Hospital records sent to his/her office.  If you experience worsening of your admission symptoms, develop shortness of breath, life threatening emergency, suicidal or homicidal thoughts you must seek medical attention immediately by calling 911 or calling your MD immediately  if symptoms less severe.  You must read complete instructions/literature  along with all the possible adverse reactions/side effects for all the Medicines you take and that have been prescribed to you. Take any new Medicines after you have completely understood and accpet all the possible adverse reactions/side effects.   Do not drive or operate heavy machinery when taking Pain medications.   Do not take more than prescribed Pain, Sleep and Anxiety Medications  Special Instructions: If you have smoked or chewed Tobacco   in the last 2 yrs please stop smoking, stop any regular Alcohol  and or any Recreational drug use.  Wear Seat belts while driving.  Please note You were cared for by a hospitalist during your hospital stay. If you have any questions about your discharge medications or the care you received while you were in the hospital after you are discharged, you can call the unit and asked to speak with the hospitalist on call if the hospitalist that took care of you is not available. Once you are discharged, your primary care physician will handle any further medical issues. Please note that NO REFILLS for any discharge medications will be authorized once you are discharged, as it is imperative that you return to your primary care physician (or establish a relationship with a primary care physician if you do not have one) for your aftercare needs so that they can reassess your need for medications and monitor your lab values.  You can reach the hospitalist office at phone [phone] or fax [phone]   If you do not have a primary care physician, you can call [phone] for a physician referral.  Activity: As tolerated with Full fall precautions use walker/cane & assistance as needed    Diet: regular  Disposition Home

## 2022-06-27 NOTE — Progress Notes (Signed)
Mobility Specialist Cancellation/Refusal Note:   06/27/22 1015  Mobility  Activity Refused mobility     Reason for Cancellation/Refusal: Pt declined mobility at this time. Pt not in room. Will check back as schedule permits.   [facility]

## 2022-06-27 NOTE — Progress Notes (Signed)
Patient was given discharge instructions, and all questions were answered. Patient was stable for discharge and was walked to the main exit. 

## 2022-06-27 NOTE — Discharge Summary (Signed)
Physician Discharge Summary  Gregory Jacobs KWI:[id number] DOB: 26-Dec-1970 DOA: 06/25/2022  PCP: Unk Pinto, MD  Admit date: 06/25/2022 Discharge date: 06/27/2022  Admitted From: home Disposition:  home  Recommendations for Outpatient Follow-up:  Follow up with PCP in 1-2 weeks Please obtain BMP/CBC in one week  Home Health: none Equipment/Devices: none  Discharge Condition: stable CODE STATUS: Full code Diet Orders (From admission, onward)     Start     Ordered   06/26/22 1114  Diet regular Room service appropriate? Yes; Fluid consistency: Thin  Diet effective now       Question Answer Comment  Room service appropriate? Yes   Fluid consistency: Thin      06/26/22 1113            HPI: Per admitting MD, Gregory Jacobs is a 51 y.o. M with DM, HTN, CAD, fatty liver, hx gallstones s/p cholecystectomy who presented with abdominal pain, N/V for 3-4 days, progressively worse.  In the ER, CT showed cirrhosis, no acute findings.  Given concern for CBD stone, case discussed with Dr. Benson Norway.  Lipase 319, AST/ALT slightly elevated.   WBC 21K, glucose normal.  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  GI consulted and followed patient while hospitalized.  He has a history of cholecystectomy but clinically there was concern for choledocholithiasis with an already passed stone.  Imaging did not show any obstruction.  LFTs have been improving, he was evaluated by GI and with improvement in his LFTs and symptoms will be discharged home in stable condition, and he will  have outpatient follow-up  Active problems Mild pancreatitis-he had epigastric pain and elevated lipase, CT did not show any evidence of significant pancreatic inflammation but he had 2 out of 3 criteria.  Again, this is possibly related to a passed CBD stone.  Recommend hold Ozempic for the next week, and if he clinically returns to baseline could resume afterwards CAD-continue home medications NAFLD-outpatient management DM2-hold Ozempic, A1c 5.5 HTN-resume home medications  Sepsis ruled out   Discharge Instructions   Allergies as of 06/27/2022   No Known Allergies      Medication List     STOP taking these medications    Ozempic (1 MG/DOSE) 4 MG/3ML Sopn Generic drug: Semaglutide (1 MG/DOSE)       TAKE these medications    albuterol 108 (90 Base) MCG/ACT inhaler Commonly known as: Ventolin HFA Inhale 1-2 puffs into the lungs every 4 (four) hours as needed for wheezing or shortness of breath.   APPLE CIDER VINEGAR PO Take 1 capsule by mouth in the morning and at bedtime.   aspirin EC 81 MG tablet Take 81 mg by mouth in the morning. Swallow whole.   Cholecalciferol 1.25 MG (50000 UT) Tabs Take 1 tablet by mouth once a week.   Cinnamon 500 MG capsule Take 500 mg by mouth 2 (two) times daily.   Contour Next Monitor w/Device Kit Check blood sugar once daily   Contour Next Test test strip Generic drug: glucose blood Check blood sugar once daily   Lancets Micro Thin 33G Misc Check blood sugar once daily  lisinopril 5 MG tablet Commonly known as: ZESTRIL TAKE 1 TABLET BY MOUTH EVERY DAY FOR BLOOD PRESSURE What changed:  how much to take how to take this when to take this   Multivitamin Men 50+ Tabs Take by mouth daily.   rosuvastatin 5 MG tablet Commonly known as: CRESTOR Take 1 tab 4 days a week for cholesterol (MWFSat)   traZODone 100 MG tablet Commonly known as: DESYREL TAKE 1 TABLET BY MOUTH EVERYDAY AT BEDTIME What changed:  how much to  take how to take this when to take this reasons to take this   ZYRTEC PO Take by mouth daily.        Follow-up Information     Willia Craze, NP Follow up on 07/29/2022.   Specialty: Gastroenterology Why: at 10:30 Contact information: [place] [address] [phone]                 Consultations: GI  Procedures/Studies:  MR ABDOMEN MRCP W WO CONTAST  Result Date: 06/25/2022 CLINICAL DATA:  Pancreatitis EXAM: MRI ABDOMEN WITHOUT AND WITH CONTRAST (INCLUDING MRCP) TECHNIQUE: Multiplanar multisequence MR imaging of the abdomen was performed both before and after the administration of intravenous contrast. Heavily T2-weighted images of the biliary and pancreatic ducts were obtained, and three-dimensional MRCP images were rendered by post processing. CONTRAST:  9 mL Vueway IV COMPARISON:  CT abdomen/pelvis dated 06/25/2022 FINDINGS: Lower chest: Lung bases are clear. Hepatobiliary: Nodular hepatic contour (series 4/image 19), suggesting mild cirrhosis. Mild hepatic steatosis. No suspicious/enhancing hepatic lesions. Status post cholecystectomy. No intrahepatic or extrahepatic ductal dilatation. Pancreas: No peripancreatic inflammatory changes to confirm the diagnosis of acute pancreatitis. 5 mm cyst at the pancreatic tail (series 4/image 24) and 7 mm unilocular cyst in the pancreatic head (series 4/image 28), likely reflecting small pseudocysts or side branch IPMNs, benign. No main pancreatic ductal dilatation. No well-defined fluid collection or walled-off necrosis. Spleen:  Within normal limits. Adrenals/Urinary Tract:  Adrenal glands are within normal limits. Right renal cysts, measuring up to 2.0 cm in the right upper pole (series 4/image 29), benign (Bosniak I). Left kidney is within normal limits. No hydronephrosis. Stomach/Bowel: Stomach and visualized bowel are unremarkable. Vascular/Lymphatic:  No evidence of abdominal aortic aneurysm. No suspicious  abdominal lymphadenopathy. Other:  No abdominal ascites. Musculoskeletal: No focal osseous lesions. IMPRESSION: No MR findings to confirm the diagnosis of acute pancreatitis. Mild cirrhosis. Mild hepatic steatosis. No suspicious/enhancing hepatic lesions. Additional ancillary findings as above. Electronically Signed   By: Julian Hy M.D.   On: 06/25/2022 21:30   MR 3D Recon At Scanner  Result Date: 06/25/2022 CLINICAL DATA:  Pancreatitis EXAM: MRI ABDOMEN WITHOUT AND WITH CONTRAST (INCLUDING MRCP) TECHNIQUE: Multiplanar multisequence MR imaging of the abdomen was performed both before and after the administration of intravenous contrast. Heavily T2-weighted images of the biliary and pancreatic ducts were obtained, and three-dimensional MRCP images were rendered by post processing. CONTRAST:  9 mL Vueway IV COMPARISON:  CT abdomen/pelvis dated 06/25/2022 FINDINGS: Lower chest: Lung bases are clear. Hepatobiliary: Nodular hepatic contour (series 4/image 19), suggesting mild cirrhosis. Mild hepatic steatosis. No suspicious/enhancing hepatic lesions. Status post cholecystectomy. No intrahepatic or extrahepatic ductal dilatation. Pancreas: No peripancreatic inflammatory changes to confirm the diagnosis of acute pancreatitis. 5 mm cyst at the pancreatic tail (series 4/image 24) and 7 mm unilocular cyst in the pancreatic head (series 4/image 28), likely reflecting small pseudocysts or side branch IPMNs, benign. No main pancreatic ductal dilatation. No well-defined fluid collection  or walled-off necrosis. Spleen:  Within normal limits. Adrenals/Urinary Tract:  Adrenal glands are within normal limits. Right renal cysts, measuring up to 2.0 cm in the right upper pole (series 4/image 29), benign (Bosniak I). Left kidney is within normal limits. No hydronephrosis. Stomach/Bowel: Stomach and visualized bowel are unremarkable. Vascular/Lymphatic:  No evidence of abdominal aortic aneurysm. No suspicious abdominal  lymphadenopathy. Other:  No abdominal ascites. Musculoskeletal: No focal osseous lesions. IMPRESSION: No MR findings to confirm the diagnosis of acute pancreatitis. Mild cirrhosis. Mild hepatic steatosis. No suspicious/enhancing hepatic lesions. Additional ancillary findings as above. Electronically Signed   By: Julian Hy M.D.   On: 06/25/2022 21:30   CT ABDOMEN PELVIS W CONTRAST  Result Date: 06/25/2022 CLINICAL DATA:  Abdominal pain, acute nonlocalized. Central abdominal pain for 4 days with vomiting. EXAM: CT ABDOMEN AND PELVIS WITH CONTRAST TECHNIQUE: Multidetector CT imaging of the abdomen and pelvis was performed using the standard protocol following bolus administration of intravenous contrast. RADIATION DOSE REDUCTION: This exam was performed according to the departmental dose-optimization program which includes automated exposure control, adjustment of the mA and/or kV according to patient size and/or use of iterative reconstruction technique. CONTRAST:  123m OMNIPAQUE IOHEXOL 300 MG/ML  SOLN COMPARISON:  Abdominopelvic CT 09/28/2020. FINDINGS: Lower chest: Clear lung bases. No significant pleural or pericardial effusion. Hepatobiliary: The liver is normal in density without suspicious focal abnormality. Mild contour irregularity of the liver is again noted, suspicious for cirrhosis. Ill-defined low-density adjacent to the falciform ligament on image 19/2 appears slightly more conspicuous, although is most consistent with focal fat. No arterial phase enhancing lesions are identified. Stable mild extrahepatic biliary prominence post cholecystectomy. Pancreas: Stable 6 mm low-density lesion in the pancreatic head on image 30/2, unlikely to be clinically significant based on stability. No pancreatic ductal dilatation or surrounding inflammation. Spleen: Normal in size without focal abnormality. Adrenals/Urinary Tract: Both adrenal glands appear normal. No evidence of urinary tract calculus,  suspicious renal lesion or hydronephrosis. Unchanged 1.7 cm cystic lesion in the interpolar region of the right kidney for which no follow-up imaging is recommended. The bladder appears unremarkable for its degree of distention. Stomach/Bowel: No enteric contrast administered. The stomach appears unremarkable for its degree of distension. No evidence of bowel wall thickening, distention or surrounding inflammatory change. The appendix appears normal. Mildly prominent stool in the distal colon. Vascular/Lymphatic: There are no enlarged abdominal or pelvic lymph nodes. Aortic and branch vessel atherosclerosis without evidence of aneurysm or large vessel occlusion. Reproductive: The prostate gland and seminal vesicles appear normal. Other: No evidence of abdominal wall mass or hernia. No ascites. Musculoskeletal: No acute or significant osseous findings. IMPRESSION: 1. No acute findings or explanation for the patient's symptoms. 2. Stable mild extrahepatic biliary prominence post cholecystectomy, likely physiologic. 3. Stable mild contour irregularity of the liver, suspicious for cirrhosis. 4.  Aortic Atherosclerosis (ICD10-I70.0). Electronically Signed   By: WRichardean SaleM.D.   On: 06/25/2022 16:17   DG Hand Complete Left  Result Date: 06/13/2022 CLINICAL DATA:  Metal bar landed across fingers. EXAM: LEFT HAND - COMPLETE 3+ VIEW COMPARISON:  None Available. FINDINGS: There is no evidence of fracture or dislocation. There is no evidence of arthropathy or other focal bone abnormality. Soft tissues are unremarkable. IMPRESSION: Negative. Electronically Signed   By: ARonney AstersM.D.   On: 06/13/2022 23:30   DG Chest 2 View  Result Date: 06/13/2022 CLINICAL DATA:  Cough.  Right-sided chest pain. EXAM: CHEST - 2 VIEW COMPARISON:  09/22/2021  FINDINGS: The heart size and mediastinal contours are within normal limits. Both lungs are clear. The visualized skeletal structures are unremarkable. IMPRESSION: No active  cardiopulmonary disease. Electronically Signed   By: Marlaine Hind M.D.   On: 06/13/2022 15:02     Subjective: - no chest pain, shortness of breath, no abdominal pain, nausea or vomiting.   Discharge Exam: BP 138/88 (BP Location: Right Arm)   Pulse 78   Temp 98.2 F (36.8 C) (Oral)   Resp 16   Ht 6' 2"  (1.88 m)   Wt 92 kg   SpO2 97%   BMI 26.04 kg/m   General: Pt is alert, awake, not in acute distress Cardiovascular: RRR, S1/S2 +, no rubs, no gallops Respiratory: CTA bilaterally, no wheezing, no rhonchi Abdominal: Soft, NT, ND, bowel sounds + Extremities: no edema, no cyanosis    The results of significant diagnostics from this hospitalization (including imaging, microbiology, ancillary and laboratory) are listed below for reference.     Microbiology: No results found for this or any previous visit (from the past 240 hour(s)).   Labs: Basic Metabolic Panel: Recent Labs  Lab 06/25/22 1129 06/25/22 2147 06/26/22 0514 06/27/22 0424  NA 135  --  136 135  K 3.9  --  4.5 4.7  CL 101  --  102 105  CO2 26  --  26 24  GLUCOSE 134*  --  137* 142*  BUN 14  --  16 15  CREATININE 1.05  --  1.03 0.93  CALCIUM 9.4  --  8.9 8.8*  MG  --  1.9 2.3  --    Liver Function Tests: Recent Labs  Lab 06/25/22 1129 06/26/22 0514 06/27/22 0424  AST 17 640* 156*  ALT 45* 774* 508*  ALKPHOS 50 116 104  BILITOT 1.6* 6.6* 2.2*  PROT 6.6 6.1* 6.1*  ALBUMIN 4.3 3.4* 3.3*   CBC: Recent Labs  Lab 06/25/22 1129 06/26/22 0514 06/27/22 0424  WBC 21.4* 14.9* 10.9*  NEUTROABS  --  11.2*  --   HGB 17.7* 16.1 16.0  HCT 50.4 48.3 47.8  MCV 85.7 89.0 89.3  PLT 226 182 145*   CBG: No results for input(s): "GLUCAP" in the last 168 hours. Hgb A1c No results for input(s): "HGBA1C" in the last 72 hours. Lipid Profile No results for input(s): "CHOL", "HDL", "LDLCALC", "TRIG", "CHOLHDL", "LDLDIRECT" in the last 72 hours. Thyroid function studies No results for input(s): "TSH",  "T4TOTAL", "T3FREE", "THYROIDAB" in the last 72 hours.  Invalid input(s): "FREET3" Urinalysis    Component Value Date/Time   COLORURINE YELLOW 06/25/2022 [place] 06/25/2022 1546   LABSPEC 1.010 06/25/2022 1546   PHURINE 6.5 06/25/2022 1546   GLUCOSEU NEGATIVE 06/25/2022 1546   HGBUR NEGATIVE 06/25/2022 1546   BILIRUBINUR NEGATIVE 06/25/2022 1546   KETONESUR NEGATIVE 06/25/2022 1546   PROTEINUR NEGATIVE 06/25/2022 1546   NITRITE NEGATIVE 06/25/2022 1546   LEUKOCYTESUR NEGATIVE 06/25/2022 1546    FURTHER DISCHARGE INSTRUCTIONS:   Get Medicines reviewed and adjusted: Please take all your medications with you for your next visit with your Primary MD   Laboratory/radiological data: Please request your Primary MD to go over all hospital tests and procedure/radiological results at the follow up, please ask your Primary MD to get all Hospital records sent to his/her office.   In some cases, they will be blood work, cultures and biopsy results pending at the time of your discharge. Please request that your primary care M.D. goes through all  the records of your hospital data and follows up on these results.   Also Note the following: If you experience worsening of your admission symptoms, develop shortness of breath, life threatening emergency, suicidal or homicidal thoughts you must seek medical attention immediately by calling 911 or calling your MD immediately  if symptoms less severe.   You must read complete instructions/literature along with all the possible adverse reactions/side effects for all the Medicines you take and that have been prescribed to you. Take any new Medicines after you have completely understood and accpet all the possible adverse reactions/side effects.    Do not drive when taking Pain medications or sleeping medications (Benzodaizepines)   Do not take more than prescribed Pain, Sleep and Anxiety Medications. It is not advisable to combine  anxiety,sleep and pain medications without talking with your primary care practitioner   Special Instructions: If you have smoked or chewed Tobacco  in the last 2 yrs please stop smoking, stop any regular Alcohol  and or any Recreational drug use.   Wear Seat belts while driving.   Please note: You were cared for by a hospitalist during your hospital stay. Once you are discharged, your primary care physician will handle any further medical issues. Please note that NO REFILLS for any discharge medications will be authorized once you are discharged, as it is imperative that you return to your primary care physician (or establish a relationship with a primary care physician if you do not have one) for your post hospital discharge needs so that they can reassess your need for medications and monitor your lab values.  Time coordinating discharge: 40 minutes  SIGNED:  Marzetta Board, MD, PhD 06/27/2022, 1:47 PM

## 2022-06-27 NOTE — Progress Notes (Signed)
Daily Progress Note  Hospital Day: 3  Chief Complaint: abdominal pain, abnormal liver chemistries  Brief History 51 year old male known to Dr. Carlean Purl with a PMH of MAFLD, CAD, DM, and s/p lap chole. Seen in consultation by GI on 10/8 for upper  abdominal pain, elevated lipase and abnormal liver enzymes.    Assessment / Plan   # Elevated liver chemistries , upper abdominal pain. Is is s/p cholecystectomy for cholelithiasis 10 years ago. MRCP negative for CBD stones.   Elevation in liver chemistries possibly related to acute viral infection ( recent URI ) but his the description of his pain really sounds more biliary,despite negative MRCP. Maybe biliary sludge caused temporary biliary obstruction and possibly mild pancreatitis ( his lipase was elevated).   No culprit medication identified Update: Significant improvement in liver chemistries overnight. AST 640 > 156, ALT 774 >> 508, Tbili 6.6 >> 2.2 Will discuss with Dr. Carlean Purl but patient could possibly be discharged today with plans for follow up LFTs in a week  # Cirrhosis without evidence for portal HTN (by imaging) / mild hepatic steatosis.  No evidence for portal Compensated. 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 Hepatic serologic evaluation in 2020 was negative. Has since received hepatitis A, B vaccination.    [place] screening/surveillance:  [facility]  [place] screening:  No focal liver lesions on MRI   Subjective   Upper abdominal pain much better today. Some nausea today but thinks that was due to difficult venipuncture causing pain. Tolerated solids.   Objective  Imaging:  MR ABDOMEN MRCP W WO CONTAST  Result Date: 06/25/2022 CLINICAL DATA:  Pancreatitis EXAM: MRI ABDOMEN WITHOUT AND WITH CONTRAST (INCLUDING MRCP) TECHNIQUE: Multiplanar multisequence MR imaging of the abdomen was performed both before and after the administration of intravenous contrast. Heavily T2-weighted images of the biliary and  pancreatic ducts were obtained, and three-dimensional MRCP images were rendered by post processing. CONTRAST:  9 mL Vueway IV COMPARISON:  CT abdomen/pelvis dated 06/25/2022 FINDINGS: Lower chest: Lung bases are clear. Hepatobiliary: Nodular hepatic contour (series 4/image 19), suggesting mild cirrhosis. Mild hepatic steatosis. No suspicious/enhancing hepatic lesions. Status post cholecystectomy. No intrahepatic or extrahepatic ductal dilatation. Pancreas: No peripancreatic inflammatory changes to confirm the diagnosis of acute pancreatitis. 5 mm cyst at the pancreatic tail (series 4/image 24) and 7 mm unilocular cyst in the pancreatic head (series 4/image 28), likely reflecting small pseudocysts or side branch IPMNs, benign. No main pancreatic ductal dilatation. No well-defined fluid collection or walled-off necrosis. Spleen:  Within normal limits. Adrenals/Urinary Tract:  Adrenal glands are within normal limits. Right renal cysts, measuring up to 2.0 cm in the right upper pole (series 4/image 29), benign (Bosniak I). Left kidney is within normal limits. No hydronephrosis. Stomach/Bowel: Stomach and visualized bowel are unremarkable. Vascular/Lymphatic:  No evidence of abdominal aortic aneurysm. No suspicious abdominal lymphadenopathy. Other:  No abdominal ascites. Musculoskeletal: No focal osseous lesions. IMPRESSION: No MR findings to confirm the diagnosis of acute pancreatitis. Mild cirrhosis. Mild hepatic steatosis. No suspicious/enhancing hepatic lesions. Additional ancillary findings as above. Electronically Signed   By: Julian Hy M.D.   On: 06/25/2022 21:30   MR 3D Recon At Scanner  Result Date: 06/25/2022 CLINICAL DATA:  Pancreatitis EXAM: MRI ABDOMEN WITHOUT AND WITH CONTRAST (INCLUDING MRCP) TECHNIQUE: Multiplanar multisequence MR imaging of the abdomen was performed both before and after the administration of intravenous contrast. Heavily T2-weighted images of the biliary and pancreatic ducts  were obtained, and three-dimensional  MRCP images were rendered by post processing. CONTRAST:  9 mL Vueway IV COMPARISON:  CT abdomen/pelvis dated 06/25/2022 FINDINGS: Lower chest: Lung bases are clear. Hepatobiliary: Nodular hepatic contour (series 4/image 19), suggesting mild cirrhosis. Mild hepatic steatosis. No suspicious/enhancing hepatic lesions. Status post cholecystectomy. No intrahepatic or extrahepatic ductal dilatation. Pancreas: No peripancreatic inflammatory changes to confirm the diagnosis of acute pancreatitis. 5 mm cyst at the pancreatic tail (series 4/image 24) and 7 mm unilocular cyst in the pancreatic head (series 4/image 28), likely reflecting small pseudocysts or side branch IPMNs, benign. No main pancreatic ductal dilatation. No well-defined fluid collection or walled-off necrosis. Spleen:  Within normal limits. Adrenals/Urinary Tract:  Adrenal glands are within normal limits. Right renal cysts, measuring up to 2.0 cm in the right upper pole (series 4/image 29), benign (Bosniak I). Left kidney is within normal limits. No hydronephrosis. Stomach/Bowel: Stomach and visualized bowel are unremarkable. Vascular/Lymphatic:  No evidence of abdominal aortic aneurysm. No suspicious abdominal lymphadenopathy. Other:  No abdominal ascites. Musculoskeletal: No focal osseous lesions. IMPRESSION: No MR findings to confirm the diagnosis of acute pancreatitis. Mild cirrhosis. Mild hepatic steatosis. No suspicious/enhancing hepatic lesions. Additional ancillary findings as above. Electronically Signed   By: Julian Hy M.D.   On: 06/25/2022 21:30   CT ABDOMEN PELVIS W CONTRAST  Result Date: 06/25/2022 CLINICAL DATA:  Abdominal pain, acute nonlocalized. Central abdominal pain for 4 days with vomiting. EXAM: CT ABDOMEN AND PELVIS WITH CONTRAST TECHNIQUE: Multidetector CT imaging of the abdomen and pelvis was performed using the standard protocol following bolus administration of intravenous contrast.  RADIATION DOSE REDUCTION: This exam was performed according to the departmental dose-optimization program which includes automated exposure control, adjustment of the mA and/or kV according to patient size and/or use of iterative reconstruction technique. CONTRAST:  140m OMNIPAQUE IOHEXOL 300 MG/ML  SOLN COMPARISON:  Abdominopelvic CT 09/28/2020. FINDINGS: Lower chest: Clear lung bases. No significant pleural or pericardial effusion. Hepatobiliary: The liver is normal in density without suspicious focal abnormality. Mild contour irregularity of the liver is again noted, suspicious for cirrhosis. Ill-defined low-density adjacent to the falciform ligament on image 19/2 appears slightly more conspicuous, although is most consistent with focal fat. No arterial phase enhancing lesions are identified. Stable mild extrahepatic biliary prominence post cholecystectomy. Pancreas: Stable 6 mm low-density lesion in the pancreatic head on image 30/2, unlikely to be clinically significant based on stability. No pancreatic ductal dilatation or surrounding inflammation. Spleen: Normal in size without focal abnormality. Adrenals/Urinary Tract: Both adrenal glands appear normal. No evidence of urinary tract calculus, suspicious renal lesion or hydronephrosis. Unchanged 1.7 cm cystic lesion in the interpolar region of the right kidney for which no follow-up imaging is recommended. The bladder appears unremarkable for its degree of distention. Stomach/Bowel: No enteric contrast administered. The stomach appears unremarkable for its degree of distension. No evidence of bowel wall thickening, distention or surrounding inflammatory change. The appendix appears normal. Mildly prominent stool in the distal colon. Vascular/Lymphatic: There are no enlarged abdominal or pelvic lymph nodes. Aortic and branch vessel atherosclerosis without evidence of aneurysm or large vessel occlusion. Reproductive: The prostate gland and seminal vesicles  appear normal. Other: No evidence of abdominal wall mass or hernia. No ascites. Musculoskeletal: No acute or significant osseous findings. IMPRESSION: 1. No acute findings or explanation for the patient's symptoms. 2. Stable mild extrahepatic biliary prominence post cholecystectomy, likely physiologic. 3. Stable mild contour irregularity of the liver, suspicious for cirrhosis. 4.  Aortic Atherosclerosis (ICD10-I70.0). Electronically Signed   By:  Richardean Sale M.D.   On: 06/25/2022 16:17    Lab Results: Recent Labs    06/25/22 1129 06/26/22 0514 06/27/22 0424  WBC 21.4* 14.9* 10.9*  HGB 17.7* 16.1 16.0  HCT 50.4 48.3 47.8  PLT 226 182 145*   BMET Recent Labs    06/25/22 1129 06/26/22 0514 06/27/22 0424  NA 135 136 135  K 3.9 4.5 4.7  CL 101 102 105  CO2 '26 26 24  '$ GLUCOSE 134* 137* 142*  BUN '14 16 15  '$ CREATININE 1.05 1.03 0.93  CALCIUM 9.4 8.9 8.8*   LFT Recent Labs    06/27/22 0424  PROT 6.1*  ALBUMIN 3.3*  AST 156*  ALT 508*  ALKPHOS 104  BILITOT 2.2*   PT/INR Recent Labs    06/26/22 0514 06/27/22 0424  LABPROT 14.6 13.8  INR 1.1 1.1     Scheduled inpatient medications:   aspirin EC  81 mg Oral Daily   Continuous inpatient infusions:  PRN inpatient medications: HYDROmorphone (DILAUDID) injection, naLOXone (NARCAN)  injection, ondansetron (ZOFRAN) IV, oxyCODONE  Vital signs in last 24 hours: Temp:  [97.6 F (36.4 C)-98.3 F (36.8 C)] 98.2 F (36.8 C) (10/09 0433) Pulse Rate:  [69-83] 74 (10/09 0433) Resp:  [16-18] 18 (10/09 0433) BP: (130-150)/(82-96) 139/90 (10/09 0433) SpO2:  [96 %-98 %] 98 % (10/09 0433) Last BM Date : 06/26/22  Intake/Output Summary (Last 24 hours) at 06/27/2022 0852 Last data filed at 06/27/2022 0443 Gross per 24 hour  Intake 480 ml  Output --  Net 480 ml    Intake/Output from previous day: 10/08 0701 - 10/09 0700 In: 480 [P.O.:480] Out: -  Intake/Output this shift: No intake/output data recorded.   Physical Exam:   General: Alert male in NAD Heart:  Regular rate and rhythm. No lower extremity edema Pulmonary: Normal respiratory effort Abdomen: Soft, nondistended, nontender. Normal bowel sounds.  Neurologic: Alert and oriented Psych: Pleasant. Cooperative.    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

## 2022-06-30 NOTE — Progress Notes (Signed)
Gregory Jacobs     This very nice 51 y.o. MWM  was admitted to the hospital on  06/25/2022  and patient was discharged from the hospital on   06/27/2022  . The patient now presents for follow up for transition from recent hospitalization .  The day after discharge  our clinical staff contacted the patient to assure stability and schedule a follow up appointment. The discharge summary, medications and diagnostic test results were reviewed before meeting with the patient. The patient was admitted for:   Acute gallstone pancreatitis  Hyperbilirubinemia Essential hypertension Hyperlipidemia associated with type 2 diabetes mellitus ([place]) Type 2 diabetes mellitus with stage 2 chronic kidney disease, without long-term current use of insulin (HCC) NAFLD (nonalcoholic fatty liver disease) 2-vessel coronary artery disease      Patient is a very nice 51 yo MWM who presented with a 3-4 day prodrome of nausea, RUQ pain &  abdominal bloating.  Patient had primordial hx of Cholecystectomy in 2011.   He presented with an elevated WBC 21,400  & essentially Nl LFT's. Initial Lipase was elevated at 319. Initial Abd CT scan was unremarkable except possible occult cirrhosis (in a non drinker) . Patient apparently had hx/o Hep A & B vaccinations in 2021 & 2022.   The 2sd hospital day found his WBC had dropped to 14,900, but his T Bili peaked to 6.6  and LFT's peaked at ALT 774  and AST at 640.  PT/INR was Nl.   On the 3rd hospital & discharge day, T.Bili had dropped to 2.2 and AST dropped to 508 and AST to 156. Abd MRI showed no acute pancreatitis or retained biliary stones. GI consultation by Dr Benson Norway was non diagnostic,  but as patient was improving , it was recommended to discharge & f/u as out-patient. It was speculated that patient may has passed a biliary stone.       Hospitalization discharge instructions and medications are reconciled with the patient.      Patient is also followed with  Hypertension, Hyperlipidemia, Pre-Diabetes, NAFLD,  and Vitamin D Deficiency.      Patient is treated for HTN & BP has been controlled at home.  Chest  CT 05/2020 showed aortic atherosclerosis & 2 vessel CAD & patient  was initiated on rosuvastatin.    Echocardiogram & Stress Myoview on 11/08/2021 were both Normal.  Today's BP was at goal -  130/88. Patient has had no complaints of any cardiac type chest pain, palpitations, dyspnea/orthopnea/PND, dizziness, claudication, or dependent edema.     Hyperlipidemia is controlled with diet & meds. Patient denies myalgias or other med SE's. Last Lipids were at goal :  Lab Results  Component Value Date   CHOL 137 06/13/2022   HDL 37 (L) 06/13/2022   LDLCALC 72 06/13/2022   TRIG 182 (H) 06/13/2022   CHOLHDL 3.7 06/13/2022      Also, the patient has history of T2_NIDDM - now diet controlled since weight loss on Ozempic.  Patient has had no symptoms of reactive hypoglycemia, diabetic polys, paresthesias or visual blurring.  Last A1c was normal & at goal :   Lab Results  Component Value Date   HGBA1C 5.5 06/13/2022      Further, the patient also has history of Vitamin D Deficiency and supplements vitamin D without any suspected side-effects. Last vitamin D was  low (goal is 70-100) : Lab Results  Component Value Date   VD25OH 38 11/22/2021     Current Outpatient  Medications on File Prior to Visit  Medication Sig   albuterol  HFA  inhaler Inhale 1-2 puffs every 4 hours as needed    APPLE CIDER VINEGAR  Take 1 capsule in the morning and at bedtime.   aspirin EC 81 MG tablet Take  in the morning   Cetirizine  10 mg Take daily.   Vitamin D   50,000 u Take 1 tablet b once a week.   Cinnamon 500 MG capsule Take 2  times daily.    lisinopril  5 MG tablet TAKE 1 TABLET  EVERY DAY    Multiple Vitamins-Minerals  Take  daily.   rosuvastatin 5 MG tablet Take 1 tab 4 days a week for cholesterol (MWFSat)   traZODone 100 MG tablet Take at bedtime as needed  for sleep.      PMHx:   Past Medical History:  Diagnosis Date   2-vessel coronary artery disease 11/20/2020   Acute respiratory failure with hypoxia (HCC)    Allergy    Basal cell carcinoma (BCC) of skin of nose 03/11/2021   Chronic kidney disease    COVID-19    Diabetes mellitus without complication ([place]) 8366   Fatty liver    Hyperlipidemia    Hypertension 2012   Pancreatitis    Pneumonia due to COVID-19 virus 04/24/2020   Prostatitis 02/08/2018       Immunization History  Administered Date(s) Administered   Hep A / Hep B 12/28/2020, 02/05/2021   Hepatitis A, Adult 07/05/2021   Hepb-cpg 07/05/2021   Pneumococcal -23 07/11/2018   Td 05/28/2013   Tdap 01/21/2021     Past Surgical History:  Procedure Laterality Date   CHOLECYSTECTOMY, LAPAROSCOPIC  2011   VASECTOMY  2016     FHx:    Reviewed / unchanged  SHx:    Reviewed / unchanged    Systems Review:  Constitutional: Denies fever, chills, wt changes, headaches, insomnia, fatigue, night sweats, change in appetite. Eyes: Denies redness, blurred vision, diplopia, discharge, itchy, watery eyes.  ENT: Denies discharge, congestion, post nasal drip, epistaxis, sore throat, earache, hearing loss, dental pain, tinnitus, vertigo, sinus pain, snoring.  CV: Denies chest pain, palpitations, irregular heartbeat, syncope, dyspnea, diaphoresis, orthopnea, PND, claudication or edema. Respiratory: denies cough, dyspnea, DOE, pleurisy, hoarseness, laryngitis, wheezing.  Gastrointestinal: Denies dysphagia, odynophagia, heartburn, reflux, water brash, abdominal pain or cramps, nausea, vomiting, bloating, diarrhea, constipation, hematemesis, melena, hematochezia  or hemorrhoids. Genitourinary: Denies dysuria, frequency, urgency, nocturia, hesitancy, discharge, hematuria or flank pain. Musculoskeletal: Denies arthralgias, myalgias, stiffness, jt. swelling, pain, limping or strain/sprain.  Skin: Denies pruritus, rash, hives, warts, acne,  eczema or change in skin lesion(s). Neuro: No weakness, tremor, incoordination, spasms, paresthesia or pain. Psychiatric: Denies confusion, memory loss or sensory loss. Endo: Denies change in weight, skin or hair change.  Heme/Lymph: No excessive bleeding, bruising or enlarged lymph nodes.  Physical Exam  BP 130/88   Pulse 87   Temp 98.3 F (36.8 C)   Resp 16   Ht '6\' 2"'$  (1.88 m)   Wt 203 lb 12.8 oz (92.4 kg)   SpO2 98%   BMI 26.17 kg/m   Appears well nourished, well groomed  and in no distress.  Eyes: PERRLA, EOMs, conjunctiva no swelling or erythema. Sinuses: No frontal/maxillary tenderness ENT/Mouth: EAC's clear, TM's nl w/o erythema, bulging. Nares clear w/o erythema, swelling, exudates. Oropharynx clear without erythema or exudates. Oral hygiene is good. Tongue normal, non obstructing. Hearing intact.  Neck: Supple. Thyroid nl. Car 2+/2+ without bruits,  nodes or JVD. Chest: Respirations nl with BS clear & equal w/o rales, rhonchi, wheezing or stridor.  Cor: Heart sounds normal w/ regular rate and rhythm without sig. murmurs, gallops, clicks or rubs. Peripheral pulses normal and equal  without edema.  Abdomen: Soft & bowel sounds normal. Non-tender w/o guarding, rebound, hernias, masses or organomegaly.  Lymphatics: Unremarkable.  Musculoskeletal: Full ROM all peripheral extremities, joint stability, 5/5 strength and normal gait.  Skin: Warm, dry without exposed rashes, lesions or ecchymosis apparent.  Neuro: Cranial nerves intact, reflexes equal bilaterally. Sensory-motor testing grossly intact. Tendon reflexes grossly intact.  Pysch: Alert & oriented x 3.  Insight and judgement nl & appropriate. No ideations.  Assessment and Plan:  1. Acute gallstone pancreatitis  - CBC with Differential/Platelet - COMPLETE METABOLIC PANEL WITH GFR - Lipase - Amylase  2. Hyperbilirubinemia  - COMPLETE METABOLIC PANEL WITH GFR  3. Essential hypertension  - Continue medication,  monitor blood pressure at home.  - Continue DASH diet.  Reminder to go to the ER if any CP,  SOB, nausea, dizziness, severe HA, changes vision/speech. - COMPLETE METABOLIC PANEL WITH GFR  4. Hyperlipidemia associated with type 2 diabetes mellitus ([place])   5. Type 2 diabetes mellitus with stage 2 chronic kidney disease, without long-term current use of insulin (HCC)  - COMPLETE METABOLIC PANEL WITH GFR  6. NAFLD (nonalcoholic fatty liver disease)  - COMPLETE METABOLIC PANEL WITH GFR  7.   2-vessel coronary artery disease   8. Medication management  - CBC with Differential/Platelet - COMPLETE METABOLIC PANEL WITH GFR - Lipase - Amylase       Discussed  regular exercise, BP monitoring, weight control to achieve/maintain BMI less than 25 and discussed meds and SE's. Recommended labs to assess and monitor clinical status with further disposition pending results of labs. Over 30 minutes of exam, counseling, chart review was performed.   Kirtland Bouchard, MD

## 2022-07-01 ENCOUNTER — Encounter: Payer: Self-pay | Admitting: Internal Medicine

## 2022-07-01 ENCOUNTER — Ambulatory Visit (INDEPENDENT_AMBULATORY_CARE_PROVIDER_SITE_OTHER): Payer: Managed Care, Other (non HMO) | Admitting: Internal Medicine

## 2022-07-01 VITALS — BP 130/88 | HR 87 | Temp 98.3°F | Resp 16 | Ht 74.0 in | Wt 203.8 lb

## 2022-07-01 DIAGNOSIS — K760 Fatty (change of) liver, not elsewhere classified: Secondary | ICD-10-CM

## 2022-07-01 DIAGNOSIS — E785 Hyperlipidemia, unspecified: Secondary | ICD-10-CM | POA: Diagnosis not present

## 2022-07-01 DIAGNOSIS — I10 Essential (primary) hypertension: Secondary | ICD-10-CM

## 2022-07-01 DIAGNOSIS — E1122 Type 2 diabetes mellitus with diabetic chronic kidney disease: Secondary | ICD-10-CM

## 2022-07-01 DIAGNOSIS — N182 Chronic kidney disease, stage 2 (mild): Secondary | ICD-10-CM

## 2022-07-01 DIAGNOSIS — K851 Biliary acute pancreatitis without necrosis or infection: Secondary | ICD-10-CM | POA: Diagnosis not present

## 2022-07-01 DIAGNOSIS — Z79899 Other long term (current) drug therapy: Secondary | ICD-10-CM

## 2022-07-01 DIAGNOSIS — E1169 Type 2 diabetes mellitus with other specified complication: Secondary | ICD-10-CM

## 2022-07-01 DIAGNOSIS — I2510 Atherosclerotic heart disease of native coronary artery without angina pectoris: Secondary | ICD-10-CM

## 2022-07-01 NOTE — Patient Instructions (Signed)
Chronic Pancreatitis  Chronic pancreatitis is permanent inflammation and scarring of the pancreas that leads to pancreatic dysfunction. The pancreas is a gland that is found behind the stomach. The pancreas makes proteins (enzymes) that help to digest food. It also releases hormones called glucagon and insulin. These help regulate blood sugar (glucose). Damage to the pancreas may affect digestion, may cause pain in the upper abdomen and back, and may cause diabetes. Inflammation can also irritate other organs in the abdomen near the pancreas. At first, pancreatitis may be sudden (acute). When you have repeated or long-lasting episodes of acute pancreatitis, damage to the pancreas can be permanent and lead to chronic pancreatitis. Sometimes, though, there is no history of acute pancreatitis. What are the causes? The most common cause of this condition is heavy alcohol use. Other causes include: Hypertriglyceridemia. This is increased, or elevated, levels of triglycerides in the blood. Gallstones or other conditions that block the tube that drains the pancreas (pancreatic duct). Health conditions such as pancreatic cancer or a problem where the body's defense system (immune system) attacks the pancreas (autoimmune pancreatitis). Hypercalcemia. This is elevated calcium levels in the blood. This condition may be caused by the parathyroid gland being too active (hyperparathyroidism). Having an injured or infected pancreas. Being exposed to certain medicines or certain chemicals. In children, chronic pancreatitis is most often caused by inherited conditions. These come from genes that are passed from parent to child. The most common of these conditions is cystic fibrosis. In some cases, the cause of chronic pancreatitis may not be known. What increases the risk? This condition is more likely to develop in people who: Are male. Are 52-67 years old. Have a family history of pancreatitis. Smoke  tobacco. Drink a lot of alcohol over a long period of time. What are the signs or symptoms? Symptoms of this condition may include: Pain in the abdomen or upper back. Pain may be severe and often gets worse after you eat. Nausea and vomiting. Fever. Weight loss. A change in the color and firmness (consistency) of stool (feces), such as stools that are oily, fatty, or clay-colored. How is this diagnosed? This condition is diagnosed based on your symptoms, your medical history, and a physical exam. You may have tests, such as: Blood tests. Stool samples. Biopsy of the pancreas. This is the removal of a sample of pancreas tissue to be tested in a lab. Imaging tests, such as CT scans, MRIs, or an ultrasound of the abdomen. How is this treated? Chronic pancreatitis results in permanent damage that leads to pancreatic dysfunction and long-term (chronic) pain. To manage chronic pancreatitis, you will need to: Stop using alcohol or tobacco. Manage pain. Methods of pain control may include: Medicines such as those used for pain or depression (antidepressants). Surgery to remove a blockage or buildup of fluid causing pain. A procedure to block the nerves that sense pain in the pancreas (celiac plexus nerve block). Improve digestion. You may be given: Medicines to replace your pancreatic enzymes. Vitamin supplements. A specific diet to follow. You may work with a dietitian to make an eating plan. Monitor for the development of diabetes. You may need to: Get screened for diabetes regularly. Check your blood glucose levels at home at regular times. Sometimes, acute flares of pain may require hospital treatment. Follow these instructions at home: Eating and drinking     Do not drink alcohol. If you need help quitting, ask your health care provider. Follow a diet as told by your health  care provider or dietitian, if this applies. This may include: Limiting how much fat you eat. Eating smaller  meals more often. Avoiding caffeine. Drink enough fluid to keep your urine pale yellow. General instructions Take over-the-counter and prescription medicines only as told by your health care provider. These include vitamin supplements. Ask your health care provider if the medicine prescribed to you: Requires you to avoid driving or using machinery. Can cause constipation. You may need to take these actions to prevent or treat constipation: Take over-the-counter or prescription medicines. Eat foods that are high in fiber, such as beans, whole grains, and fresh fruits and vegetables. Limit foods that are high in fat and processed sugars, such as fried or sweet foods. Do not use any products that contain nicotine or tobacco. These products include cigarettes, chewing tobacco, and vaping devices, such as e-cigarettes. If you need help quitting, ask your health care provider. If directed, check your blood sugar at home as told. Keep all follow-up visits. This is important. Contact a health care provider if: You have pain that does not get better with medicine. You have a fever. You have sudden weight loss. Get help right away if: Your pain suddenly gets worse. You have sudden swelling in your abdomen. You start to vomit often. You have diarrhea that does not go away. You vomit blood or have blood in your stool. You become confused or you have trouble thinking clearly. These symptoms may be an emergency. Get help right away. Call 911. Do not wait to see if the symptoms will go away. Do not drive yourself to the hospital. Summary Chronic pancreatitis is permanent inflammation and scarring of the pancreas that leads to pancreatic dysfunction. Damage to the pancreas may affect digestion, may cause pain in the upper abdomen and back, and may cause diabetes. Inflammation can also irritate other organs in the abdomen near the pancreas. Common causes of this condition are heavy alcohol use,  gallstones, increased (elevated) levels of triglycerides, and certain medicines. To manage this condition: control pain, replace enzymes, and do not drink alcohol. This information is not intended to replace advice given to you by your health care provider. Make sure you discuss any questions you have with your health care provider. Document Revised: 07/27/2021 Document Reviewed: 07/27/2021 Elsevier Patient Education  [employer].

## 2022-07-02 ENCOUNTER — Encounter: Payer: Self-pay | Admitting: Internal Medicine

## 2022-07-02 LAB — LIPASE: Lipase: 351 U/L — ABNORMAL HIGH (ref 7–60)

## 2022-07-02 LAB — CBC WITH DIFFERENTIAL/PLATELET
Absolute Monocytes: 561 cells/uL (ref 200–950)
Basophils Absolute: 61 cells/uL (ref 0–200)
Basophils Relative: 0.6 %
Eosinophils Absolute: 71 cells/uL (ref 15–500)
Eosinophils Relative: 0.7 %
HCT: 46.8 % (ref 38.5–50.0)
Hemoglobin: 16.2 g/dL (ref 13.2–17.1)
Lymphs Abs: 2417 cells/uL (ref 850–3900)
MCH: 30.3 pg (ref 27.0–33.0)
MCHC: 34.6 g/dL (ref 32.0–36.0)
MCV: 87.5 fL (ref 80.0–100.0)
MPV: 11 fL (ref 7.5–12.5)
Monocytes Relative: 5.5 %
Neutro Abs: 7089 cells/uL (ref 1500–7800)
Neutrophils Relative %: 69.5 %
Platelets: 194 10*3/uL (ref 140–400)
RBC: 5.35 10*6/uL (ref 4.20–5.80)
RDW: 12.4 % (ref 11.0–15.0)
Total Lymphocyte: 23.7 %
WBC: 10.2 10*3/uL (ref 3.8–10.8)

## 2022-07-02 LAB — COMPLETE METABOLIC PANEL WITH GFR
AG Ratio: 1.9 (calc) (ref 1.0–2.5)
ALT: 133 U/L — ABNORMAL HIGH (ref 9–46)
AST: 22 U/L (ref 10–35)
Albumin: 4.6 g/dL (ref 3.6–5.1)
Alkaline phosphatase (APISO): 86 U/L (ref 35–144)
BUN: 16 mg/dL (ref 7–25)
CO2: 27 mmol/L (ref 20–32)
Calcium: 9.6 mg/dL (ref 8.6–10.3)
Chloride: 104 mmol/L (ref 98–110)
Creat: 1.08 mg/dL (ref 0.70–1.30)
Globulin: 2.4 g/dL (calc) (ref 1.9–3.7)
Glucose, Bld: 148 mg/dL — ABNORMAL HIGH (ref 65–99)
Potassium: 4.3 mmol/L (ref 3.5–5.3)
Sodium: 141 mmol/L (ref 135–146)
Total Bilirubin: 1 mg/dL (ref 0.2–1.2)
Total Protein: 7 g/dL (ref 6.1–8.1)
eGFR: 83 mL/min/{1.73_m2} (ref >=60)

## 2022-07-02 LAB — AMYLASE: Amylase: 167 U/L — ABNORMAL HIGH (ref 21–101)

## 2022-07-02 NOTE — Progress Notes (Signed)
<><><><><><><><><><><><><><><><><><><><><><><><><><><><><><><><><> <><><><><><><><><><><><><><><><><><><><><><><><><><><><><><><><><> -   Test results slightly outside the reference range are not unusual. If there is anything important, I will review this with you,  otherwise it is considered normal test values.  If you have further questions,  please do not hesitate to contact me at the office or via My Chart.  <><><><><><><><><><><><><><><><><><><><><><><><><><><><><><><><><> <><><><><><><><><><><><><><><><><><><><><><><><><><><><><><><><><>  -  Glucose = 148 - is elevated , so agree back on Metformin til can restart Ozempic  - Liver enzymes                         AST  is totally back to Normal                         ALT is much improved, but still elevated  - down from 774  -> 508 -> to now 133                                      ( Normal is less than 46)                         - So suggest repeal CMET in 1-2 weeks  <><><><><><><><><><><><><><><><><><><><><><><><><><><><><><><><><>  - Pancreas enzymes  ( Amylase & Lipase) also still a little elevated, So                                                                                   will need to continue follow-ups  <><><><><><><><><><><><><><><><><><><><><><><><><><><><><><><><><>  -CBC back to Normal WBC & RBC <><><><><><><><><><><><><><><><><><><><><><><><><><><><><><><><><>

## 2022-07-03 ENCOUNTER — Encounter: Payer: Self-pay | Admitting: Internal Medicine

## 2022-07-07 ENCOUNTER — Other Ambulatory Visit: Payer: Self-pay

## 2022-07-07 DIAGNOSIS — K760 Fatty (change of) liver, not elsewhere classified: Secondary | ICD-10-CM

## 2022-07-11 ENCOUNTER — Encounter: Payer: Self-pay | Admitting: Internal Medicine

## 2022-07-12 NOTE — Telephone Encounter (Signed)
Patient called states he is returning your call. Please call to adive

## 2022-07-12 NOTE — Telephone Encounter (Signed)
I went over Gregory Jacobs's lab results as Gregory Savoy NP advised. He is aware to come back and have LFT's drawn mid November. He has an appointment with Gregory Jacobs 07/29/2022.

## 2022-07-29 ENCOUNTER — Ambulatory Visit: Payer: Managed Care, Other (non HMO) | Admitting: Nurse Practitioner

## 2022-08-04 ENCOUNTER — Encounter: Payer: Self-pay | Admitting: Adult Health

## 2022-08-04 ENCOUNTER — Other Ambulatory Visit: Payer: Self-pay

## 2022-08-04 MED ORDER — ROSUVASTATIN CALCIUM 5 MG PO TABS: ORAL_TABLET | ORAL | 3 refills | Status: DC

## 2022-08-05 ENCOUNTER — Telehealth: Payer: Self-pay

## 2022-08-05 NOTE — Telephone Encounter (Signed)
-----   Message from Marice Potter, RN sent at 07/07/2022 11:53 AM EDT ----- Pt needs repeat LFTs. Order placed.

## 2022-08-05 NOTE — Telephone Encounter (Signed)
Left message on pt's voicemail. Also called pt's wife and let her know that pt needs repeat labs. Pt's wife verbalized understanding.

## 2022-08-26 ENCOUNTER — Encounter: Payer: Self-pay | Admitting: Internal Medicine

## 2022-08-30 ENCOUNTER — Other Ambulatory Visit (INDEPENDENT_AMBULATORY_CARE_PROVIDER_SITE_OTHER): Payer: Managed Care, Other (non HMO)

## 2022-08-30 DIAGNOSIS — K760 Fatty (change of) liver, not elsewhere classified: Secondary | ICD-10-CM

## 2022-08-30 LAB — HEPATIC FUNCTION PANEL
ALT: 26 U/L (ref 0–53)
AST: 20 U/L (ref 0–37)
Albumin: 4.7 g/dL (ref 3.5–5.2)
Alkaline Phosphatase: 50 U/L (ref 39–117)
Bilirubin, Direct: 0.1 mg/dL (ref 0.0–0.3)
Total Bilirubin: 0.8 mg/dL (ref 0.2–1.2)
Total Protein: 7.1 g/dL (ref 6.0–8.3)

## 2022-08-31 ENCOUNTER — Other Ambulatory Visit: Payer: Managed Care, Other (non HMO)

## 2022-08-31 ENCOUNTER — Encounter: Payer: Self-pay | Admitting: Nurse Practitioner

## 2022-08-31 ENCOUNTER — Ambulatory Visit (INDEPENDENT_AMBULATORY_CARE_PROVIDER_SITE_OTHER): Payer: Managed Care, Other (non HMO) | Admitting: Nurse Practitioner

## 2022-08-31 VITALS — BP 130/90 | HR 88 | Ht 74.0 in | Wt 217.0 lb

## 2022-08-31 DIAGNOSIS — K7460 Unspecified cirrhosis of liver: Secondary | ICD-10-CM

## 2022-08-31 NOTE — Progress Notes (Addendum)
Assessment    Patient profile:  Gregory Jacobs is a 51 y.o. male known to Dr. Carlean Jacobs with a past medical history of MAFLD, CAD, DM, cholecystectomy, cirrhosis. See PMH /PSH for additional history  # 51 yo male with recent admission for nausea, abdominal pain, elevated liver chemistries and elevated lipase. Felt ultimately to have passed a gallstone despite negative MRCP. He is s/p remote cholecystectomy.  Liver tests abnormalities and abdominal pain have resolved.  There was no evidence for pancreatitis on imaging despite elevated lipase but it should be noted that lipase was in 300's which could be related to gallstone but also is on Ozempic   # ? Side branch IPMNs. 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   # Persistent intermittent nausea. Etiology?    # Cirrhosis on imaging. No evidence for portal HTN. 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 No history of significant Etoh use. Complete serologic workup in 2020 was negative including hemochromatosis DNA - PCR done for elevated ferritin.   # Colon cancer screening. Normal colonoscopy in May 2022, next one due in 10 years  Plan   ** Will be exported to patient instructions on AVS  Schedule for EGD for varices screening  The risks and benefits of EGD with possible biopsies were discussed with the patient who agrees to proceed.  Gregory Jacobs screening: No liver lesions on MRI in Oct 2023. Next surveillance imagine can be an [place] in April 2023. Obtain AFP today.  He received hep A and B vaccines in October 2022 Avoid Etoh ( he doesn't drink much anyway) Maintain normal BMI. He is on Ozempic (I will add to his home med list) Declines anti-emetic for nausea.    -------------------------------------------------------------  GI attending  I am not convinced he has cirrhosis - imaging suggests PLTs are NL so that goes against.  I think we can  defer EGD at this time.  Re: imaging - since he had an MR can wait 1 year to repeat liver imaging and would do MR for ? Cirrhosis and pancreatic cysts  LFTS and AFP are normal now I will recheck amylase and lipase to see if they have normalized    Will discuss w/ Ms. Merrilyn Puma, MD, [facility]     HPI    Chief complaint:  hospital follow up   Gregory Jacobs was seen by [place] in the hospital October 2023 for evaluation of abdominal pain, nausea, abnormal liver enzymes and elevated lipase.  He has had a cholecystectomy. MRCP did not show any retained stones / duct dilation .  The cause of the acute rise in liver enzymes and bilirubin was not apparent but felt to be secondary to a passed CBD stone.  He had recently had a URI so there was question of whether or not viral etiology could have caused the liver test abnormalities but the presence of abdominal pain seem most compatible with passed stone.  His LFTs significantly improved and he was discharged home with plans for outpatient follow-up with [place]  Interval History:  Liver chemistries on 08/30/2022 were normal. Gregory Jacobs feels okay. He does continue to have intermittent nausea without vomiting. He is back on Ozempic and trying to manage his weight.    Labs:     Latest Ref Rng & Units 07/01/2022   12:00 AM 06/27/2022    4:24 AM 06/26/2022  5:14 AM  CBC  WBC 3.8 - 10.8 Thousand/uL 10.2  10.9  14.9   Hemoglobin 13.2 - 17.1 g/dL 16.2  16.0  16.1   Hematocrit 38.5 - 50.0 % 46.8  47.8  48.3   Platelets 140 - 400 Thousand/uL 194  145  182        Latest Ref Rng & Units 08/30/2022   10:10 AM 07/01/2022   12:00 AM 06/27/2022    4:24 AM  Hepatic Function  Total Protein 6.0 - 8.3 g/dL 7.1  7.0  6.1   Albumin 3.5 - 5.2 g/dL 4.7   3.3   AST 0 - 37 U/L 20  22  156   ALT 0 - 53 U/L 26  133  508   Alk Phosphatase 39 - 117 U/L 50   104   Total Bilirubin 0.2 - 1.2 mg/dL 0.8  1.0  2.2   Bilirubin, Direct 0.0 - 0.3 mg/dL 0.1        Past  Medical History:  Diagnosis Date   2-vessel coronary artery disease 11/20/2020   Acute respiratory failure with hypoxia (HCC)    Allergy    Basal cell carcinoma (BCC) of skin of nose 03/11/2021   Chronic kidney disease    COVID-19    Diabetes mellitus without complication ([place]) 6153   Fatty liver    Hyperlipidemia    Hypertension 2012   Pancreatitis    Pneumonia due to COVID-19 virus 04/24/2020   Prostatitis 02/08/2018    Past Surgical History:  Procedure Laterality Date   CHOLECYSTECTOMY, LAPAROSCOPIC  2011   VASECTOMY  2016    Current Medications, Allergies, Family History and Social History were reviewed in [employer] record.     Current Outpatient Medications  Medication Sig Dispense Refill   albuterol (VENTOLIN HFA) 108 (90 Base) MCG/ACT inhaler Inhale 1-2 puffs into the lungs every 4 (four) hours as needed for wheezing or shortness of breath. 1 each 0   APPLE CIDER VINEGAR PO Take 1 capsule by mouth in the morning and at bedtime.     aspirin EC 81 MG tablet Take 81 mg by mouth in the morning. Swallow whole.     Blood Glucose Monitoring Suppl (CONTOUR NEXT MONITOR) w/Device KIT Check blood sugar once daily 1 kit 0   Cetirizine HCl (ZYRTEC PO) Take by mouth daily.     Cholecalciferol 1.25 MG (50000 UT) TABS Take 1 tablet by mouth once a week.     Cinnamon 500 MG capsule Take 500 mg by mouth 2 (two) times daily.      glucose blood (CONTOUR NEXT TEST) test strip Check blood sugar once daily 100 each 1   Lancets Micro Thin 33G MISC Check blood sugar once daily 100 each 1   lisinopril (ZESTRIL) 5 MG tablet TAKE 1 TABLET BY MOUTH EVERY DAY FOR BLOOD PRESSURE (Patient taking differently: Take 5 mg by mouth daily. TAKE 1 TABLET BY MOUTH EVERY DAY FOR BLOOD PRESSURE) 90 tablet 3   Multiple Vitamins-Minerals (MULTIVITAMIN MEN 50+) TABS Take by mouth daily.     rosuvastatin (CRESTOR) 5 MG tablet Take 1 tab 4 days a week for cholesterol (MWFSat) 48 tablet 3    traZODone (DESYREL) 100 MG tablet TAKE 1 TABLET BY MOUTH EVERYDAY AT BEDTIME (Patient taking differently: Take 100 mg by mouth at bedtime as needed for sleep. TAKE 1 TABLET BY MOUTH EVERYDAY AT BEDTIME) 90 tablet 2   No current facility-administered medications for this visit.  Review of Systems: No chest pain. No shortness of breath. No urinary complaints.    Physical Exam  Wt Readings from Last 3 Encounters:  07/01/22 203 lb 12.8 oz (92.4 kg)  06/25/22 202 lb 12.8 oz (92 kg)  06/13/22 209 lb 6.4 oz (95 kg)    BP (!) 130/90   Pulse 88   Ht _0  (1.88 m)   Wt 217 lb (98.4 kg)   BMI 27.86 kg/m  Constitutional:  Generally well appearing male in no acute distress. Psychiatric: Pleasant. Normal mood and affect. Behavior is normal. EENT: Pupils normal.  Conjunctivae are normal. No scleral icterus. Neck supple.  Cardiovascular: Normal rate, regular rhythm. No edema Pulmonary/chest: Effort normal and breath sounds normal. No wheezing, rales or rhonchi. Abdominal: Soft, nondistended, nontender. Bowel sounds active throughout. There are no masses palpable. No hepatomegaly. Neurological: Alert and oriented to person place and time. Skin: Skin is warm and dry. No rashes noted.  Tye Savoy, NP  08/31/2022, 8:31 AM  Cc:  Unk Pinto, MD

## 2022-08-31 NOTE — Patient Instructions (Addendum)
Your provider has requested that you go to the basement level for lab work before leaving today. Press "B" on the elevator. The lab is located at the first door on the left as you exit the elevator.   You have been scheduled for an endoscopy. Please follow written instructions given to you at your visit today. If you use inhalers (even only as needed), please bring them with you on the day of your procedure.   Due to recent changes in healthcare laws, you may see the results of your imaging and laboratory studies on MyChart before your provider has had a chance to review them.  We understand that in some cases there may be results that are confusing or concerning to you. Not all laboratory results come back in the same time frame and the provider may be waiting for multiple results in order to interpret others.  Please give [place] 48 hours in order for your provider to thoroughly review all the results before contacting the office for clarification of your results.    Thank you for choosing me and [facility].  Wallace Going, NP

## 2022-09-01 LAB — AFP TUMOR MARKER: AFP-Tumor Marker: 2 ng/mL (ref <6.1)

## 2022-09-06 ENCOUNTER — Telehealth: Payer: Self-pay | Admitting: Internal Medicine

## 2022-09-06 NOTE — Telephone Encounter (Signed)
Please call patient and explain we are changing some things after I have reviewed recent visit w/NP  10 I doubt he has cirrhosis though is at risk  2) does not need EGD at this time - cancel  3) MR/MRCP in Oct 2024 (place reminder) to f/u pancreas cysts and also recheck liver  4) amylase and lipase now (not urgent)  please dx elevated lipase and elevated amylase  5) will contact with lab results and plans

## 2022-09-08 ENCOUNTER — Other Ambulatory Visit: Payer: Self-pay

## 2022-09-08 DIAGNOSIS — R748 Abnormal levels of other serum enzymes: Secondary | ICD-10-CM

## 2022-09-08 NOTE — Telephone Encounter (Signed)
Incoming call from patient returning your call states he works 3rd shift and will be going to bed shortly. Please advise

## 2022-09-08 NOTE — Telephone Encounter (Signed)
Pt made aware of Dr. Carlean Purl recommendations:  Reminder placed in Epic: MR/MRCP in Oct 2024 to f/u pancreas cysts and also recheck liver: Pt made aware and made notified to mark his calendar as well Orders for labs been placed in Epic: Pt made aware: Location to lab given  EGD canceled: Pt made aware:  Pt verbalized understanding with all questions answered.

## 2022-09-08 NOTE — Telephone Encounter (Signed)
Reminder placed in Epic: MR/MRCP in Oct 2024 to f/u pancreas cysts and also recheck liver  Orders for labs been placed in Epic: Left message for pt to call back

## 2022-09-13 ENCOUNTER — Other Ambulatory Visit (INDEPENDENT_AMBULATORY_CARE_PROVIDER_SITE_OTHER): Payer: Managed Care, Other (non HMO)

## 2022-09-13 DIAGNOSIS — R748 Abnormal levels of other serum enzymes: Secondary | ICD-10-CM

## 2022-09-13 LAB — LIPASE: Lipase: 70 U/L — ABNORMAL HIGH (ref 11.0–59.0)

## 2022-09-13 LAB — AMYLASE: Amylase: 69 U/L (ref 27–131)

## 2022-09-15 ENCOUNTER — Encounter: Payer: Self-pay | Admitting: Nurse Practitioner

## 2022-09-15 ENCOUNTER — Other Ambulatory Visit: Payer: Self-pay | Admitting: Internal Medicine

## 2022-09-15 DIAGNOSIS — R748 Abnormal levels of other serum enzymes: Secondary | ICD-10-CM

## 2022-09-27 ENCOUNTER — Encounter: Payer: Managed Care, Other (non HMO) | Admitting: Internal Medicine

## 2022-09-28 ENCOUNTER — Telehealth: Payer: Self-pay

## 2022-09-28 NOTE — Telephone Encounter (Signed)
-----   Message from Willia Craze, NP sent at 09/23/2022  4:18 PM EST ----- Mickel Baas,  Please call patient. Tell him I talked with Dr. Carlean Purl who is not convinced he has cirrhosis.  Please:  1. I had scheduled him for an EGD to evaluate for complications of cirrhosis, please make sure that is cancelled.   2. Dr. Carlean Purl wants an MRI in one year to reassess for possible cirrhosis and reevaluate pancreatic cysts. Please put on recall list 3. Please get amylase and lipase ( orders already placed)   Thanks

## 2022-09-28 NOTE — Telephone Encounter (Signed)
Reminder placed for MRI in one year. Spoke with pt and let him know recommendations. EGD has already been canceled. Pt verbalized understanding and confirmed he would stop by lab for amylase and lipase.

## 2022-10-03 ENCOUNTER — Encounter: Payer: Self-pay | Admitting: Nurse Practitioner

## 2022-10-03 ENCOUNTER — Ambulatory Visit (INDEPENDENT_AMBULATORY_CARE_PROVIDER_SITE_OTHER): Payer: 59 | Admitting: Nurse Practitioner

## 2022-10-03 VITALS — BP 134/72 | HR 94 | Temp 98.4°F | Ht 74.0 in | Wt 225.8 lb

## 2022-10-03 DIAGNOSIS — E1169 Type 2 diabetes mellitus with other specified complication: Secondary | ICD-10-CM

## 2022-10-03 DIAGNOSIS — I700 Atherosclerosis of aorta: Secondary | ICD-10-CM | POA: Diagnosis not present

## 2022-10-03 DIAGNOSIS — Z136 Encounter for screening for cardiovascular disorders: Secondary | ICD-10-CM

## 2022-10-03 DIAGNOSIS — I2510 Atherosclerotic heart disease of native coronary artery without angina pectoris: Secondary | ICD-10-CM

## 2022-10-03 DIAGNOSIS — Z0000 Encounter for general adult medical examination without abnormal findings: Secondary | ICD-10-CM | POA: Diagnosis not present

## 2022-10-03 DIAGNOSIS — Z79899 Other long term (current) drug therapy: Secondary | ICD-10-CM

## 2022-10-03 DIAGNOSIS — M542 Cervicalgia: Secondary | ICD-10-CM

## 2022-10-03 DIAGNOSIS — K760 Fatty (change of) liver, not elsewhere classified: Secondary | ICD-10-CM

## 2022-10-03 DIAGNOSIS — E291 Testicular hypofunction: Secondary | ICD-10-CM

## 2022-10-03 DIAGNOSIS — E1122 Type 2 diabetes mellitus with diabetic chronic kidney disease: Secondary | ICD-10-CM

## 2022-10-03 DIAGNOSIS — E663 Overweight: Secondary | ICD-10-CM

## 2022-10-03 DIAGNOSIS — Z1389 Encounter for screening for other disorder: Secondary | ICD-10-CM

## 2022-10-03 DIAGNOSIS — E559 Vitamin D deficiency, unspecified: Secondary | ICD-10-CM

## 2022-10-03 DIAGNOSIS — Z0001 Encounter for general adult medical examination with abnormal findings: Secondary | ICD-10-CM

## 2022-10-03 DIAGNOSIS — Z1329 Encounter for screening for other suspected endocrine disorder: Secondary | ICD-10-CM

## 2022-10-03 DIAGNOSIS — Z8719 Personal history of other diseases of the digestive system: Secondary | ICD-10-CM

## 2022-10-03 DIAGNOSIS — I10 Essential (primary) hypertension: Secondary | ICD-10-CM | POA: Diagnosis not present

## 2022-10-03 NOTE — Patient Instructions (Signed)
Cervical spine xray ordered at [facility] [place] M-F 8:30-3:45   [place] what a healthy weight is for you (roughly BMI <25) and aim to maintain this   Aim for 7+ servings of fruits and vegetables daily   70-80+ fluid ounces of water or unsweet tea for healthy kidneys   Limit to max 1 drink of alcohol per day; avoid smoking/tobacco   Limit animal fats in diet for cholesterol and heart health - choose grass fed whenever available   Avoid highly processed foods, and foods high in saturated/trans fats   Aim for low stress - take time to unwind and care for your mental health   Aim for 150 min of moderate intensity exercise weekly for heart health, and weights twice weekly for bone health   Aim for 7-9 hours of sleep daily

## 2022-10-03 NOTE — Progress Notes (Signed)
COMPLETE PHYSICAL  Assessment and Plan:   Eusevio was seen today for annual exam.  Diagnoses and all orders for this visit:  Encounter for Annual Physical Exam with abnormal findings Due annually  Health Maintenance reviewed Healthy lifestyle reviewed and goals set Declines vaccines  Atherosclerosis of aorta ([place]) - CT 05/2020 Control blood pressure, cholesterol, glucose, increase exercise.   2 vessel CAD Discussed recommendation for cardiology evaluation vs CT coronary calcium at some point to determine degree; cannot determine this on non-cardiac imaging Denies sx; has numerous specialist appointments and CTs this year Will hold off, newly on rosuvastatin, continue ASA, aggressive lifestyle interventions He is receptive to CT coronary calcium next year Go to the ER if any chest pain, shortness of breath, nausea, dizziness, severe HA, changes vision/speech  Essential hypertension Continue low dose lisinopril 5 mg  Monitor blood pressure at home; call if consistently over 130/80 Continue DASH diet.   Reminder to go to the ER if any CP, SOB, nausea, dizziness, severe HA, changes vision/speech, left arm numbness and tingling and jaw pain. -     CBC with Differential/Platelet -     COMPLETE METABOLIC PANEL WITH GFR -     TSH -     Magnesium  Hyperlipidemia associated with type 2 diabetes mellitus (HCC) LDL goal <70; now on rosuvastatin  Discussed dietary and exercise modifications Low fat diet -     Lipid panel  Type 2 diabetes mellitus with stage 2 chronic kidney disease, without long-term current use of insulin (HCC) Continue medications: Metformin '500mg'$  two tablets BID.  Currently Ozempic is on hold- will restart if amylase and lipase are normal Discussed general issues about diabetes pathophysiology and management. Education: Reviewed 'ABCs' of diabetes management (respective goals in parentheses):  A1C (<7), blood pressure (<130/80), and cholesterol (LDL <70) Dietary  recommendations Encouraged aerobic exercise.  Discussed foot care, check daily Yearly retinal exam - he has scheduled, requested report Dental exam every 6 months Monitor blood glucose, discussed goal for patient -     Hemoglobin A1c  Stage 2 chronic kidney disease due to type 2 diabetes mellitus (HCC) Increase fluids, avoid NSAIDS, monitor sugars, will monitor  - CMP/GFR< UA, microalbumin    Vitamin D deficiency Continue supplementation to maintain goal of 70-100 Reduced to taking Vitamin D 50,000IU once a week  Check vitamin D level  Overweight (BMI 25.0-29.9)  Discussed dietary and exercise modifications Encouraged healthy behaviors, continue lifestyle modifications  Medication management Continued  Screening for ischemic heart disease - EKG  Screening for AAA - U/S ABD Retroperitoneal LTD  Screening for blood or protein in urine -     Urinalysis w reflex microscopic -     Microalbumin / creatinine urine ratio  Prostate nodule  -[facility] following  Screening PSA (prostate specific antigen) -     PSA - checked per patient preference  Hypogonadism - Testosterone, total    Hx of Pancreatitis -amylase and Lipase - If labs have returned to normal restart Ozempic Continue to follow with Dr Carlean Purl  Neck pain - CXR of cervical spine If negative will refer to ortho for further evaluation   Further disposition pending results if labs check today. Discussed med's effects and SE's.   Over 40 minutes of face to face interview, exam, counseling, chart review, and critical decision making was performed.    No future appointments.   ----------------------------------------------------------------------------------------------------------------------  HPI 52 y.o. male  presents for complete physical and follow up. He has Essential hypertension; Hyperlipidemia  associated with type 2 diabetes mellitus ([place]); Type 2 diabetes mellitus with kidney complication  ([place]); Vitamin D deficiency; NAFLD (nonalcoholic fatty liver disease); Overweight (BMI 25.0-29.9); Prostate nodule; Stage 2 chronic kidney disease due to type 2 diabetes mellitus ([place]); Pityriasis rosea; Abnormal EKG; Aortic atherosclerosis ([place]) - by Chest CT scan -  05/2020; Personal history of covid-19 (04/16/2020); Hepatomegaly; Shift work sleep disorder; Abnormal liver CT-question cirrhosis; Pancreatic lesion-diminutive hypodensity stable times years; 2-vessel coronary artery disease; Metabolic syndrome; History of basal cell carcinoma (BCC); Onychomycosis of left great toe; Hyperbilirubinemia; and Acute gallstone pancreatitis on their problem list.  He is married, works as [employer], working third shift. He has grown son in the [employer]. Married with step kids.   Recurrent covid 19 pneumonia, had significant pneumonia, respiratory failure with admission with residual fibrosis, He did see pulmonology, had PFTs, was recommended regular exercise. Improved some with increased walking.  He had fatty liver with elevated LFTs, recent External CT abd/pelvis w & w/o contrast 09/28/2020 showed prominent caudate lob and slight lobularity of the lateral segment left hepatic lob suspicious for early cirrhosis. Follow up MRI 06/2022 showed no liver lesion. He was admitted for pancreatitis 06/25/22. Was further evaluated by Wallace Going NP 08/31/22- plan was for EGD but Dr. Carlean Purl does not believe pt has cirrhosis and has decided to defer EGD. Last lipase 09/13/22 was still mildly elevated, has been holding Ozempic and plan to recheck today. He is having an aching sensation in his left upper abdomen which is intermittent. Bowel movements are normal  He continues to have numbness and tingling sensation in his right shoulder and chest. Lung exams have all been normal.  Will get cervical spine xray to check for arthritis or possible narrowing of spinal column  He had recent removal of BCC right supraclavicular and biopsy of  right posterior tibia which revealed dysplastic compound nevus with severe atypia.   He has been prescribed armodafinil PRN due to daytime fatigue/shift work in the past, takes rarely, reports still has nearly full bottle.   BMI is Body mass index is 28.99 kg/m., he has been working on diet and exercise. Reports has cut out soda (does diet only), doing lots of [place] wraps, eggs and fruit, some atkins bars while at work. Does sugar free flavors in water.  Wt Readings from Last 3 Encounters:  10/03/22 225 lb 12.8 oz (102.4 kg)  08/31/22 217 lb (98.4 kg)  07/01/22 203 lb 12.8 oz (92.4 kg)   CT 05/2020 showed aortic atherosclerosis, 2 vessel CAD, was initiated on rosuvastatin.   His blood pressure has been controlled at home (120's/70's etc), today their BP is BP: 134/72  BP Readings from Last 3 Encounters:  10/03/22 134/72  08/31/22 (!) 130/90  07/01/22 130/88   He does not workout but work physically active job. He denies chest pain, shortness of breath, dizziness.   He is on cholesterol medication (rosuvastatin 5 mg three days a week) and denies myalgias. His cholesterol is not at goal. The cholesterol last visit was:   Lab Results  Component Value Date   CHOL 137 06/13/2022   HDL 37 (L) 06/13/2022   LDLCALC 72 06/13/2022   TRIG 182 (H) 06/13/2022   CHOLHDL 3.7 06/13/2022    He has been working on diet and exercise for DMII on metformin (2000 mg), and denies foot ulcerations, increased appetite, nausea, paresthesia of the feet, polydipsia, polyuria, visual disturbances, vomiting and weight loss.  He is currently off Ozempic to determine if  this was elevating his lipase.  Last A1C in the office was:  Lab Results  Component Value Date   HGBA1C 5.5 06/13/2022   Lab Results  Component Value Date   EGFR 83 07/01/2022    Patient is on Vitamin D supplement, has reduced to 50000 IU once a month, down from 3/week  Lab Results  Component Value Date   VD25OH 38 11/22/2021     He  follows annually with [facility] Dr. Gloriann Loan, last [place] 10/02/2020, prostate nodule (neg biopsy 2019) and bloody ejaculate. Had PSA but requesting recheck today with testosterone, having more fatigue.  Lab Results  Component Value Date   PSA 0.37 08/04/2021   PSA 0.42 08/04/2020   PSA 2.4 02/09/2018    Lab Results  Component Value Date   TESTOSTERONE 605 08/04/2021       Current Medications:  Current Outpatient Medications on File Prior to Visit  Medication Sig   albuterol (VENTOLIN HFA) 108 (90 Base) MCG/ACT inhaler Inhale 1-2 puffs into the lungs every 4 (four) hours as needed for wheezing or shortness of breath.   APPLE CIDER VINEGAR PO Take 1 capsule by mouth in the morning and at bedtime.   aspirin EC 81 MG tablet Take 81 mg by mouth in the morning. Swallow whole.   Blood Glucose Monitoring Suppl (CONTOUR NEXT MONITOR) w/Device KIT Check blood sugar once daily   Cholecalciferol 1.25 MG (50000 UT) TABS Take 1 tablet by mouth once a week.   Cinnamon 500 MG capsule Take 500 mg by mouth 2 (two) times daily.    Fexofenadine HCl (ALLEGRA PO) Take by mouth.   glucose blood (CONTOUR NEXT TEST) test strip Check blood sugar once daily   Lancets Micro Thin 33G MISC Check blood sugar once daily   lisinopril (ZESTRIL) 5 MG tablet TAKE 1 TABLET BY MOUTH EVERY DAY FOR BLOOD PRESSURE (Patient taking differently: Take 5 mg by mouth daily. TAKE 1 TABLET BY MOUTH EVERY DAY FOR BLOOD PRESSURE)   Multiple Vitamins-Minerals (MULTIVITAMIN MEN 50+) TABS Take by mouth daily.   rosuvastatin (CRESTOR) 5 MG tablet Take 1 tab 4 days a week for cholesterol (MWFSat)   traZODone (DESYREL) 100 MG tablet TAKE 1 TABLET BY MOUTH EVERYDAY AT BEDTIME (Patient taking differently: Take 100 mg by mouth at bedtime as needed for sleep. TAKE 1 TABLET BY MOUTH EVERYDAY AT BEDTIME)   VITAMIN E PO Take by mouth.   zinc gluconate 50 MG tablet 50 mg by oral route.   OZEMPIC, 0.25 OR 0.5 MG/DOSE, 2 MG/3ML SOPN Inject 0.5 mg  into the skin once a week. (Patient not taking: Reported on 10/03/2022)   No current facility-administered medications on file prior to visit.     Allergies: No Known Allergies   Medical History:  Past Medical History:  Diagnosis Date   2-vessel coronary artery disease 11/20/2020   Acute respiratory failure with hypoxia (HCC)    Allergy    Basal cell carcinoma (BCC) of skin of nose 03/11/2021   Chronic kidney disease    COVID-19    Diabetes mellitus without complication ([place]) 2440   Fatty liver    Hyperlipidemia    Hypertension 2012   Pancreatitis    Pneumonia due to COVID-19 virus 04/24/2020   Prostatitis 02/08/2018   Immunization History  Administered Date(s) Administered   Hep A / Hep B 12/28/2020, 02/05/2021   Hepatitis A, Adult 07/05/2021   Hepb-cpg 07/05/2021   Pneumococcal Polysaccharide-23 07/11/2018   Td 05/28/2013   Tdap  01/21/2021   TD or Tdap: 2014  Influenza: Declines  Pneumococcal: 2019 Prevnar13: Due at 11 Shingrix: at age 75  Covid 42: declines   Last colonoscopy: 01/18/2021, 10 year recall, Dr. Carlean Purl  Last eye: 2021, my eye doctor, has scheduled, will request report Last dental: [facility], 2022, goes q34mLast derm: 07/2021   Surgical History:  He  has a past surgical history that includes Cholecystectomy, laparoscopic (2011) and Vasectomy (2016). Family History:  Hisfamily history includes Breast cancer in his mother; Breast cancer (age of onset: 651 in his maternal aunt; Diabetes in his maternal aunt and paternal uncle; Heart attack (age of onset: 749 in his father; Hypertension in his father; Suicidality (age of onset: 358 in his sister. Social History:   reports that he quit smoking about 9 years ago. His smoking use included cigarettes. He started smoking about 28 years ago. He has a 9.50 pack-year smoking history. His smokeless tobacco use includes chew. He reports current alcohol use. He reports that he does not use drugs.   Review of  Systems:  Review of Systems  Constitutional:  Negative for malaise/fatigue and weight loss.  HENT:  Negative for hearing loss and tinnitus.   Eyes:  Negative for blurred vision and double vision.  Respiratory:  Negative for cough, sputum production, shortness of breath and wheezing.   Cardiovascular:  Negative for chest pain, palpitations, orthopnea, claudication, leg swelling and PND.  Gastrointestinal:  Positive for abdominal pain (intermittent LUQ pain- dull ache). Negative for blood in stool, constipation, diarrhea, heartburn, melena, nausea and vomiting.       Intermittent LUQ abdominal pain  Genitourinary: Negative.   Musculoskeletal:  Positive for back pain and neck pain. Negative for falls, joint pain and myalgias.  Skin:  Negative for rash.       Removal of BCC right supraclavicular and dysplastic compound nevus with severe atypia right posterior tibia  Neurological:  Positive for tingling (right shoulder and upper chest). Negative for dizziness, sensory change, weakness and headaches.  Endo/Heme/Allergies:  Negative for polydipsia.  Psychiatric/Behavioral: Negative.  Negative for depression, memory loss, substance abuse and suicidal ideas. The patient is not nervous/anxious and does not have insomnia.   All other systems reviewed and are negative.   Physical Exam: BP 134/72   Pulse 94   Temp 98.4 F (36.9 C)   Ht '6\' 2"'$  (1.88 m)   Wt 225 lb 12.8 oz (102.4 kg)   SpO2 97%   BMI 28.99 kg/m  Wt Readings from Last 3 Encounters:  10/03/22 225 lb 12.8 oz (102.4 kg)  08/31/22 217 lb (98.4 kg)  07/01/22 203 lb 12.8 oz (92.4 kg)   General Appearance: Well nourished adults male, well developed, in no apparent distress. Eyes: PERRLA, conjunctiva no swelling or erythema Sinuses: No Frontal/maxillary tenderness ENT/Mouth: Ext aud canals clear, TMs without erythema, bulging. Post pharynx without swelling, or exudate on post pharynx.  Tonsils not swollen or erythematous. Hearing normal.   Neck: Supple Respiratory: Respiratory effort normal, BS equal bilaterally without rales, rhonchi, wheezing or stridor.  Cardio: RRR with no MRGs. Brisk peripheral pulses without edema.  Abdomen: Soft, + BS.  Non tender, no rebound, no palpable organomegaly.  Lymphatics: Non tender without lymphadenopathy.  Musculoskeletal: Full ROM, 5/5 strength, Normal gait Skin: Warm, dry. Biopsy areas are healing well, no s/s of infection Neuro: Cranial nerves intact. No cerebellar symptoms. Distal sensation intact by monofilament  Psych: Awake and oriented X 3, normal affect, Insight and Judgment appropriate.  GU: defer to urology   EKG: NSR, no ST changes AAA: < 3 cm  Floreine Kingdon Mikki [place], NP 2:06 PM [facility]

## 2022-10-04 ENCOUNTER — Encounter: Payer: Self-pay | Admitting: Internal Medicine

## 2022-10-04 LAB — LIPID PANEL
Cholesterol: 171 mg/dL (ref <200)
HDL: 37 mg/dL — ABNORMAL LOW (ref >=40)
LDL Cholesterol (Calc): 93 mg/dL (calc)
Non-HDL Cholesterol (Calc): 134 mg/dL (calc) — ABNORMAL HIGH (ref <130)
Total CHOL/HDL Ratio: 4.6 (calc) (ref <5.0)
Triglycerides: 308 mg/dL — ABNORMAL HIGH (ref <150)

## 2022-10-04 LAB — CBC WITH DIFFERENTIAL/PLATELET
Absolute Monocytes: 513 cells/uL (ref 200–950)
Basophils Absolute: 113 cells/uL (ref 0–200)
Basophils Relative: 1.3 %
Eosinophils Absolute: 191 cells/uL (ref 15–500)
Eosinophils Relative: 2.2 %
HCT: 46.3 % (ref 38.5–50.0)
Hemoglobin: 16 g/dL (ref 13.2–17.1)
Lymphs Abs: 2088 cells/uL (ref 850–3900)
MCH: 30.2 pg (ref 27.0–33.0)
MCHC: 34.6 g/dL (ref 32.0–36.0)
MCV: 87.4 fL (ref 80.0–100.0)
MPV: 12 fL (ref 7.5–12.5)
Monocytes Relative: 5.9 %
Neutro Abs: 5794 cells/uL (ref 1500–7800)
Neutrophils Relative %: 66.6 %
Platelets: 232 10*3/uL (ref 140–400)
RBC: 5.3 10*6/uL (ref 4.20–5.80)
RDW: 11.9 % (ref 11.0–15.0)
Total Lymphocyte: 24 %
WBC: 8.7 10*3/uL (ref 3.8–10.8)

## 2022-10-04 LAB — COMPLETE METABOLIC PANEL WITH GFR
AG Ratio: 1.8 (calc) (ref 1.0–2.5)
ALT: 39 U/L (ref 9–46)
AST: 25 U/L (ref 10–35)
Albumin: 4.3 g/dL (ref 3.6–5.1)
Alkaline phosphatase (APISO): 51 U/L (ref 35–144)
BUN: 14 mg/dL (ref 7–25)
CO2: 27 mmol/L (ref 20–32)
Calcium: 9.9 mg/dL (ref 8.6–10.3)
Chloride: 105 mmol/L (ref 98–110)
Creat: 0.92 mg/dL (ref 0.70–1.30)
Globulin: 2.4 g/dL (calc) (ref 1.9–3.7)
Glucose, Bld: 209 mg/dL — ABNORMAL HIGH (ref 65–99)
Potassium: 4.2 mmol/L (ref 3.5–5.3)
Sodium: 141 mmol/L (ref 135–146)
Total Bilirubin: 0.7 mg/dL (ref 0.2–1.2)
Total Protein: 6.7 g/dL (ref 6.1–8.1)
eGFR: 100 mL/min/{1.73_m2} (ref >=60)

## 2022-10-04 LAB — URINALYSIS, ROUTINE W REFLEX MICROSCOPIC
Bilirubin Urine: NEGATIVE
Hgb urine dipstick: NEGATIVE
Ketones, ur: NEGATIVE
Leukocytes,Ua: NEGATIVE
Nitrite: NEGATIVE
Protein, ur: NEGATIVE
Specific Gravity, Urine: 1.018 (ref 1.001–1.035)
pH: 5.5 (ref 5.0–8.0)

## 2022-10-04 LAB — MICROALBUMIN / CREATININE URINE RATIO
Creatinine, Urine: 101 mg/dL (ref 20–320)
Microalb Creat Ratio: 2 mcg/mg creat (ref <30)
Microalb, Ur: 0.2 mg/dL

## 2022-10-04 LAB — TSH: TSH: 1.61 mIU/L (ref 0.40–4.50)

## 2022-10-04 LAB — HEMOGLOBIN A1C
Hgb A1c MFr Bld: 6.1 % of total Hgb — ABNORMAL HIGH (ref <5.7)
Mean Plasma Glucose: 128 mg/dL
eAG (mmol/L): 7.1 mmol/L

## 2022-10-04 LAB — MAGNESIUM: Magnesium: 1.9 mg/dL (ref 1.5–2.5)

## 2022-10-04 LAB — VITAMIN D 25 HYDROXY (VIT D DEFICIENCY, FRACTURES): Vit D, 25-Hydroxy: 64 ng/mL (ref 30–100)

## 2022-10-04 LAB — LIPASE: Lipase: 60 U/L (ref 7–60)

## 2022-10-04 LAB — TESTOSTERONE: Testosterone: 423 ng/dL (ref 250–827)

## 2022-10-04 LAB — AMYLASE: Amylase: 72 U/L (ref 21–101)

## 2022-10-05 ENCOUNTER — Ambulatory Visit
Admission: RE | Admit: 2022-10-05 | Discharge: 2022-10-05 | Disposition: A | Discharge status: Home / Self Care | Payer: 59 | Source: Ambulatory Visit | Attending: Nurse Practitioner | Admitting: Nurse Practitioner

## 2022-10-05 DIAGNOSIS — M542 Cervicalgia: Secondary | ICD-10-CM

## 2022-10-10 ENCOUNTER — Encounter: Payer: Self-pay | Admitting: Nurse Practitioner

## 2022-10-10 ENCOUNTER — Other Ambulatory Visit: Payer: Self-pay | Admitting: Nurse Practitioner

## 2022-10-10 DIAGNOSIS — E1122 Type 2 diabetes mellitus with diabetic chronic kidney disease: Secondary | ICD-10-CM

## 2022-10-10 MED ORDER — OZEMPIC (0.25 OR 0.5 MG/DOSE) 2 MG/3ML ~~LOC~~ SOPN: 0.5000 mg | PEN_INJECTOR | SUBCUTANEOUS | 2 refills | Status: DC

## 2023-01-12 NOTE — Progress Notes (Signed)
FOLLOW UP  Assessment and Plan:   Atherosclerosis of aorta (HCC) - per CT 05/2020 Control blood pressure, cholesterol, glucose, increase exercise.   2 vessel CAD Has seen cardiology, benign stress test Continue statin for LDL goal <70, continue ASA, aggressive lifestyle interventions Go to the ER if any chest pain, shortness of breath, nausea, dizziness, severe HA, changes vision/speech  Hypertension Currently controlled with Lisinopril 5 mg QD Monitor blood pressure at home; patient to call if consistently greater than 130/80 Continue DASH diet.   Reminder to go to the ER if any CP, SOB, nausea, dizziness, severe HA, changes vision/speech, left arm numbness and tingling and jaw pain. - CBC - CMP  Hyperlipidemia associated with type 2 DM Currently above goal of LDL <70; titrate up on rosuvastatin as needed for goal; currently taking 5 mg 3 days/week  Continue low cholesterol diet and exercise.  Check lipid panel.   Diabetes with diabetic chronic kidney disease Continue medication: ozempic, holding metformin unless atypical elevations Continue diet and exercise.  Perform daily foot/skin check, notify office of any concerning changes.  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) Increase fluids, avoid NSAIDS, monitor sugars, will monitor -CBC - CMP  BPH with lower urinary tract symptoms He is having occasional split stream and difficulty completely emptying bladder Start Tamsulosin 0.4 mg daily and monitor symptoms   Overweight BMI 28  Long discussion about weight loss, diet, and exercise Recommended diet heavy in fruits and veggies and low in animal meats, cheeses, and dairy products, appropriate calorie intake Discussed ideal weight for height  Continue current efforts with lifestyle modification - recommendation given to stop nightly sugar soda Will follow up in 3 months  Vitamin D Def He is back on 50000 IU once a week which has worked well for him  in the past; continue for goal 60-100 Defer Vit D  NAFLD Continue diet, exercise and monitor LFT's Avoid Tylenol and alcohol -CMP  Medication Management Continued  Callus Continue to wear callus pads and monitor If worsens will refer to podiatry  Pain in shin Start magnesium and if no improvement notify the office  Tinea versicolor Follow up with dermatology   Continue diet and meds as discussed. Further disposition pending results of labs. Discussed med's effects and SE's.   Over 30 minutes of exam, counseling, chart review, and critical decision making was performed.   Future Appointments  Date Time Provider Department Center  10/09/2023  2:00 PM Raynelle Dick, NP [facility] None    ----------------------------------------------------------------------------------------------------------------------  HPI 52 y.o. male  presents for 3 month follow up on hypertension, cholesterol, diabetes, weight and vitamin D deficiency.    He had fatty liver with elevated LFTs, recent External CT abd/pelvis w & w/o contrast 09/28/2020 showed prominent caudate lob and slight lobularity of the lateral segment left hepatic lob suspicious for early cirrhosis. No current significant hepatic steatosis. No liver mass - also known stable pancreatic cyst; GI Dr. Leone Payor is now following. GI now believes there was no NAFLD.  BMI is Body mass index is 28.68 kg/m., he has been working on diet and exercise. He is on ozempic. Reports drinks some diet soda (2/day), does drink 1 mountain dew on night shift. Doing lots of [place] wraps, eggs, chicken salad with low carb chips and fruit, will do atkins bars while at work.   Wt Readings from Last 3 Encounters:  01/16/23 223 lb 6.4 oz (101.3 kg)  10/03/22 225 lb 12.8 oz (  102.4 kg)  08/31/22 217 lb (98.4 kg)   His blood pressure has been controlled at home (118/70s, etc) on Lisinopril 5 mg QD( will take 1/2 if weight is closer to 200 because does not need  whole dose), today their BP is BP: 130/82   BP Readings from Last 3 Encounters:  01/16/23 130/82  10/03/22 134/72  08/31/22 (!) 130/90  He does not workout but work physically active job. He denies chest pain, shortness of breath, dizziness.  CT 05/2020 showed aortic atherosclerosis, 2 vessel CAD, was initiated on rosuvastatin. Echocardiogram & Stress Myoview on 11/08/2021 were both Normal,    He is on cholesterol medication (was prescribed rosuvastatin 5 mg, taking 4 days a week M,W,F,Sat) and denies myalgias. His cholesterol is not at goal. The cholesterol last visit was:   Lab Results  Component Value Date   CHOL 171 10/03/2022   HDL 37 (L) 10/03/2022   LDLCALC 93 10/03/2022   TRIG 308 (H) 10/03/2022   CHOLHDL 4.6 10/03/2022    He has been working on diet and exercise for T2 diabetes currently by ozempic 1 mg week (holding metformin due to well controlled glucose), and denies foot ulcerations, increased appetite, nausea, paresthesia of the feet, polydipsia, polyuria, visual disturbances, vomiting and weight loss.  He checks fasting glucose, typically 100's Last A1C in the office was:  Lab Results  Component Value Date   HGBA1C 6.1 (H) 10/03/2022   CKD II associated with T2DM monitored at this office, on lisinopril. Last GFR:  Lab Results  Component Value Date   EGFR 100 10/03/2022   Patient is on Vitamin D supplement and below goal at last check, reports has resumed taking 40981 IU weekly:    Lab Results  Component Value Date   VD25OH 64 10/03/2022     He has been noticing he gets up at least once a night to urinate.  Difficulty starting stream and will occasionally have a split stream.  He does occasionally get pain in his shins that he describes as an ache. Occurs infrequently.      Current Medications:  Current Outpatient Medications on File Prior to Visit  Medication Sig   albuterol (VENTOLIN HFA) 108 (90 Base) MCG/ACT inhaler Inhale 1-2 puffs into the lungs every 4  (four) hours as needed for wheezing or shortness of breath.   APPLE CIDER VINEGAR PO Take 1 capsule by mouth in the morning and at bedtime.   aspirin EC 81 MG tablet Take 81 mg by mouth in the morning. Swallow whole.   Blood Glucose Monitoring Suppl (CONTOUR NEXT MONITOR) w/Device KIT Check blood sugar once daily   Cholecalciferol 1.25 MG (50000 UT) TABS Take 1 tablet by mouth once a week.   Cinnamon 500 MG capsule Take 500 mg by mouth 2 (two) times daily.    glucose blood (CONTOUR NEXT TEST) test strip Check blood sugar once daily   Lancets Micro Thin 33G MISC Check blood sugar once daily   lisinopril (ZESTRIL) 5 MG tablet TAKE 1 TABLET BY MOUTH EVERY DAY FOR BLOOD PRESSURE (Patient taking differently: Take 5 mg by mouth daily. TAKE 1 TABLET BY MOUTH EVERY DAY FOR BLOOD PRESSURE)   Multiple Vitamins-Minerals (MULTIVITAMIN MEN 50+) TABS Take by mouth daily.   OZEMPIC, 1 MG/DOSE, 4 MG/3ML SOPN SMARTSIG:0.45 Milliliter(s) SUB-Q Once a Week   rosuvastatin (CRESTOR) 5 MG tablet Take 1 tab 4 days a week for cholesterol (MWFSat)   traZODone (DESYREL) 100 MG tablet TAKE 1 TABLET BY MOUTH  EVERYDAY AT BEDTIME (Patient taking differently: Take 100 mg by mouth at bedtime as needed for sleep. TAKE 1 TABLET BY MOUTH EVERYDAY AT BEDTIME)   zinc gluconate 50 MG tablet 50 mg by oral route.   Fexofenadine HCl (ALLEGRA PO) Take by mouth. (Patient not taking: Reported on 01/16/2023)   OZEMPIC, 0.25 OR 0.5 MG/DOSE, 2 MG/3ML SOPN Inject 0.5 mg into the skin once a week. (Patient not taking: Reported on 01/16/2023)   VITAMIN E PO Take by mouth. (Patient not taking: Reported on 01/16/2023)   No current facility-administered medications on file prior to visit.     Allergies: No Known Allergies   Medical History:  Past Medical History:  Diagnosis Date   2-vessel coronary artery disease 11/20/2020   Acute respiratory failure with hypoxia (HCC)    Allergy    Basal cell carcinoma (BCC) of skin of nose 03/11/2021    Chronic kidney disease    COVID-19    Diabetes mellitus without complication (HCC) 2012   Fatty liver    Hyperlipidemia    Hypertension 2012   Pancreatitis    Pneumonia due to COVID-19 virus 04/24/2020   Prostatitis 02/08/2018   Family history- Reviewed and unchanged Social history- Reviewed and unchanged   Review of Systems:  Review of Systems  Constitutional:  Negative for malaise/fatigue and weight loss.  HENT:  Negative for hearing loss and tinnitus.   Eyes:  Negative for blurred vision and double vision.  Respiratory:  Negative for cough, sputum production, shortness of breath and wheezing.   Cardiovascular:  Negative for chest pain, palpitations, orthopnea, claudication and leg swelling.  Gastrointestinal:  Negative for abdominal pain, blood in stool, constipation, diarrhea, heartburn, melena, nausea and vomiting.  Genitourinary:  Negative for dysuria and urgency.       Split stream and difficulty emptying completely  Musculoskeletal:  Positive for joint pain (shins). Negative for myalgias and neck pain.  Skin:  Positive for rash (tinea of back).  Neurological:  Negative for dizziness, sensory change, weakness and headaches.  Endo/Heme/Allergies:  Negative for polydipsia.  Psychiatric/Behavioral: Negative.  Negative for depression. The patient is not nervous/anxious and does not have insomnia.   All other systems reviewed and are negative.   Physical Exam: BP 130/82   Pulse 84   Temp 97.9 F (36.6 C)   Ht 6\' 2"  (1.88 m)   Wt 223 lb 6.4 oz (101.3 kg)   SpO2 96%   BMI 28.68 kg/m  Wt Readings from Last 3 Encounters:  01/16/23 223 lb 6.4 oz (101.3 kg)  10/03/22 225 lb 12.8 oz (102.4 kg)  08/31/22 217 lb (98.4 kg)   General Appearance: Well nourished adults male, appears to be feeling unwell but in no apparent distress. Eyes: PERRLA, conjunctiva no swelling or erythema Sinuses: No Frontal/maxillary tenderness ENT/Mouth: Ext aud canals clear, TMs without erythema,  bulging. Post pharynx without swelling, or exudate on post pharynx.  Tonsils not swollen or erythematous. Hearing normal.  Neck: Supple Respiratory: Respiratory effort normal, BS equal bilaterally minor expiratory wheezing left upper lobe Cardio: RRR with no MRGs. Brisk peripheral pulses without edema.  Abdomen: Soft, + BS.  Non tender, no rebound, no palpable organomegaly.  Lymphatics: Non tender without lymphadenopathy.  Musculoskeletal: Full ROM, 5/5 strength, Normal gait.  Skin: Warm, dry,tinea versicolor of back. Small callus of left lateral foot Neuro: Cranial nerves intact. No cerebellar symptoms. Distal sensation intact.  Psych: Awake and oriented X 3, normal affect, Insight and Judgment appropriate.   Keerat Denicola E  Aundria Rud, NP 11:44 AM [facility]

## 2023-01-16 ENCOUNTER — Encounter: Payer: Self-pay | Admitting: Nurse Practitioner

## 2023-01-16 ENCOUNTER — Ambulatory Visit (INDEPENDENT_AMBULATORY_CARE_PROVIDER_SITE_OTHER): Payer: 59 | Admitting: Nurse Practitioner

## 2023-01-16 VITALS — BP 130/82 | HR 84 | Temp 97.9°F | Ht 74.0 in | Wt 223.4 lb

## 2023-01-16 DIAGNOSIS — K760 Fatty (change of) liver, not elsewhere classified: Secondary | ICD-10-CM

## 2023-01-16 DIAGNOSIS — E785 Hyperlipidemia, unspecified: Secondary | ICD-10-CM

## 2023-01-16 DIAGNOSIS — L84 Corns and callosities: Secondary | ICD-10-CM

## 2023-01-16 DIAGNOSIS — I2510 Atherosclerotic heart disease of native coronary artery without angina pectoris: Secondary | ICD-10-CM | POA: Diagnosis not present

## 2023-01-16 DIAGNOSIS — E1122 Type 2 diabetes mellitus with diabetic chronic kidney disease: Secondary | ICD-10-CM

## 2023-01-16 DIAGNOSIS — E1169 Type 2 diabetes mellitus with other specified complication: Secondary | ICD-10-CM

## 2023-01-16 DIAGNOSIS — Z6828 Body mass index (BMI) 28.0-28.9, adult: Secondary | ICD-10-CM

## 2023-01-16 DIAGNOSIS — B360 Pityriasis versicolor: Secondary | ICD-10-CM

## 2023-01-16 DIAGNOSIS — M79669 Pain in unspecified lower leg: Secondary | ICD-10-CM

## 2023-01-16 DIAGNOSIS — I10 Essential (primary) hypertension: Secondary | ICD-10-CM

## 2023-01-16 DIAGNOSIS — Z79899 Other long term (current) drug therapy: Secondary | ICD-10-CM

## 2023-01-16 DIAGNOSIS — E663 Overweight: Secondary | ICD-10-CM

## 2023-01-16 DIAGNOSIS — N182 Chronic kidney disease, stage 2 (mild): Secondary | ICD-10-CM

## 2023-01-16 DIAGNOSIS — N138 Other obstructive and reflux uropathy: Secondary | ICD-10-CM

## 2023-01-16 DIAGNOSIS — I700 Atherosclerosis of aorta: Secondary | ICD-10-CM

## 2023-01-16 DIAGNOSIS — E559 Vitamin D deficiency, unspecified: Secondary | ICD-10-CM

## 2023-01-16 DIAGNOSIS — N401 Enlarged prostate with lower urinary tract symptoms: Secondary | ICD-10-CM

## 2023-01-16 MED ORDER — TAMSULOSIN HCL 0.4 MG PO CAPS
0.4000 mg | ORAL_CAPSULE | Freq: Every day | ORAL | 3 refills | Status: DC
Start: 2023-01-16 — End: 2023-05-13

## 2023-01-16 NOTE — Patient Instructions (Signed)
Benign Prostatic Hyperplasia  Start Tamsulosin 0.4 mg once a day Benign prostatic hyperplasia (BPH) is an enlarged prostate gland that is caused by the normal aging process. The prostate may get bigger as a man gets older. The condition is not caused by cancer. The prostate is a walnut-sized gland that is involved in the production of semen. It is located in front of the rectum and below the bladder. The bladder stores urine. The urethra carries stored urine out of the body. An enlarged prostate can press on the urethra. This can make it harder to pass urine. The buildup of urine in the bladder can cause infection. Back pressure and infection may progress to bladder damage and kidney (renal) failure. What are the causes? This condition is part of the normal aging process. However, not all men develop problems from this condition. If the prostate enlarges away from the urethra, urine flow will not be blocked. If it enlarges toward the urethra and compresses it, there will be problems passing urine. What increases the risk? This condition is more likely to develop in men older than 50 years. What are the signs or symptoms? Symptoms of this condition include: Getting up often during the night to urinate. Needing to urinate frequently during the day. Difficulty starting urine flow. Decrease in size and strength of your urine stream. Leaking (dribbling) after urinating. Inability to pass urine. This needs immediate treatment. Inability to completely empty your bladder. Pain when you pass urine. This is more common if there is also an infection. Urinary tract infection (UTI). How is this diagnosed? This condition is diagnosed based on your medical history, a physical exam, and your symptoms. Tests will also be done, such as: A post-void bladder scan. This measures any amount of urine that may remain in your bladder after you finish urinating. A digital rectal exam. In a rectal exam, your health care  provider checks your prostate by putting a lubricated, gloved finger into your rectum to feel the back of your prostate gland. This exam detects the size of your gland and any abnormal lumps or growths. An exam of your urine (urinalysis). A prostate specific antigen (PSA) screening. This is a blood test used to screen for prostate cancer. An ultrasound. This test uses sound waves to electronically produce a picture of your prostate gland. Your health care provider may refer you to a specialist in kidney and prostate diseases (urologist). How is this treated? Once symptoms begin, your health care provider will monitor your condition (active surveillance or watchful waiting). Treatment for this condition will depend on the severity of your condition. Treatment may include: Observation and yearly exams. This may be the only treatment needed if your condition and symptoms are mild. Medicines to relieve your symptoms, including: Medicines to shrink the prostate. Medicines to relax the muscle of the prostate. Surgery in severe cases. Surgery may include: Prostatectomy. In this procedure, the prostate tissue is removed completely through an open incision or with a laparoscope or robotics. Transurethral resection of the prostate (TURP). In this procedure, a tool is inserted through the opening at the tip of the penis (urethra). It is used to cut away tissue of the inner core of the prostate. The pieces are removed through the same opening of the penis. This removes the blockage. Transurethral incision (TUIP). In this procedure, small cuts are made in the prostate. This lessens the prostate's pressure on the urethra. Transurethral microwave thermotherapy (TUMT). This procedure uses microwaves to create heat. The heat  destroys and removes a small amount of prostate tissue. Transurethral needle ablation (TUNA). This procedure uses radio frequencies to destroy and remove a small amount of prostate  tissue. Interstitial laser coagulation (ILC). This procedure uses a laser to destroy and remove a small amount of prostate tissue. Transurethral electrovaporization (TUVP). This procedure uses electrodes to destroy and remove a small amount of prostate tissue. Prostatic urethral lift. This procedure inserts an implant to push the lobes of the prostate away from the urethra. Follow these instructions at home: Take over-the-counter and prescription medicines only as told by your health care provider. Monitor your symptoms for any changes. Contact your health care provider with any changes. Avoid drinking large amounts of liquid before going to bed or out in public. Avoid or reduce how much caffeine or alcohol you drink. Give yourself time when you urinate. Keep all follow-up visits. This is important. Contact a health care provider if: You have unexplained back pain. Your symptoms do not get better with treatment. You develop side effects from the medicine you are taking. Your urine becomes very dark or has a bad smell. Your lower abdomen becomes distended and you have trouble passing urine. Get help right away if: You have a fever or chills. You suddenly cannot urinate. You feel light-headed or very dizzy, or you faint. There are large amounts of blood or clots in your urine. Your urinary problems become hard to manage. You develop moderate to severe low back or flank pain. The flank is the side of your body between the ribs and the hip. These symptoms may be an emergency. Get help right away. Call 911. Do not wait to see if the symptoms will go away. Do not drive yourself to the hospital. Summary Benign prostatic hyperplasia (BPH) is an enlarged prostate that is caused by the normal aging process. It is not caused by cancer. An enlarged prostate can press on the urethra. This can make it hard to pass urine. This condition is more likely to develop in men older than 50 years. Get help  right away if you suddenly cannot urinate. This information is not intended to replace advice given to you by your health care provider. Make sure you discuss any questions you have with your health care provider. Document Revised: 03/24/2021 Document Reviewed: 03/24/2021 Elsevier Patient Education  2023 [employer].

## 2023-01-17 LAB — CBC WITH DIFFERENTIAL/PLATELET
Absolute Monocytes: 537 cells/uL (ref 200–950)
Basophils Absolute: 82 cells/uL (ref 0–200)
Basophils Relative: 0.9 %
Eosinophils Absolute: 246 cells/uL (ref 15–500)
Eosinophils Relative: 2.7 %
HCT: 47.5 % (ref 38.5–50.0)
Hemoglobin: 16.4 g/dL (ref 13.2–17.1)
Lymphs Abs: 2330 cells/uL (ref 850–3900)
MCH: 30 pg (ref 27.0–33.0)
MCHC: 34.5 g/dL (ref 32.0–36.0)
MCV: 86.8 fL (ref 80.0–100.0)
MPV: 11.4 fL (ref 7.5–12.5)
Monocytes Relative: 5.9 %
Neutro Abs: 5906 cells/uL (ref 1500–7800)
Neutrophils Relative %: 64.9 %
Platelets: 222 10*3/uL (ref 140–400)
RBC: 5.47 10*6/uL (ref 4.20–5.80)
RDW: 12.6 % (ref 11.0–15.0)
Total Lymphocyte: 25.6 %
WBC: 9.1 10*3/uL (ref 3.8–10.8)

## 2023-01-17 LAB — COMPLETE METABOLIC PANEL WITH GFR
AG Ratio: 1.9 (calc) (ref 1.0–2.5)
ALT: 31 U/L (ref 9–46)
AST: 20 U/L (ref 10–35)
Albumin: 4.5 g/dL (ref 3.6–5.1)
Alkaline phosphatase (APISO): 53 U/L (ref 35–144)
BUN: 21 mg/dL (ref 7–25)
CO2: 28 mmol/L (ref 20–32)
Calcium: 9.9 mg/dL (ref 8.6–10.3)
Chloride: 106 mmol/L (ref 98–110)
Creat: 1.05 mg/dL (ref 0.70–1.30)
Globulin: 2.4 g/dL (calc) (ref 1.9–3.7)
Glucose, Bld: 122 mg/dL — ABNORMAL HIGH (ref 65–99)
Potassium: 4.6 mmol/L (ref 3.5–5.3)
Sodium: 141 mmol/L (ref 135–146)
Total Bilirubin: 0.8 mg/dL (ref 0.2–1.2)
Total Protein: 6.9 g/dL (ref 6.1–8.1)
eGFR: 85 mL/min/{1.73_m2} (ref >=60)

## 2023-01-17 LAB — LIPID PANEL
Cholesterol: 157 mg/dL (ref <200)
HDL: 36 mg/dL — ABNORMAL LOW (ref >=40)
LDL Cholesterol (Calc): 92 mg/dL (calc)
Non-HDL Cholesterol (Calc): 121 mg/dL (calc) (ref <130)
Total CHOL/HDL Ratio: 4.4 (calc) (ref <5.0)
Triglycerides: 196 mg/dL — ABNORMAL HIGH (ref <150)

## 2023-01-17 LAB — HEMOGLOBIN A1C
Hgb A1c MFr Bld: 5.9 % of total Hgb — ABNORMAL HIGH (ref <5.7)
Mean Plasma Glucose: 123 mg/dL
eAG (mmol/L): 6.8 mmol/L

## 2023-01-18 ENCOUNTER — Other Ambulatory Visit: Payer: Self-pay | Admitting: Nurse Practitioner

## 2023-01-18 DIAGNOSIS — E559 Vitamin D deficiency, unspecified: Secondary | ICD-10-CM

## 2023-01-18 MED ORDER — CHOLECALCIFEROL 1.25 MG (50000 UT) PO TABS
1.0000 | ORAL_TABLET | ORAL | 5 refills | Status: DC
Start: 2023-01-18 — End: 2023-06-29

## 2023-02-06 ENCOUNTER — Other Ambulatory Visit: Payer: Self-pay | Admitting: Nurse Practitioner

## 2023-02-06 DIAGNOSIS — E1169 Type 2 diabetes mellitus with other specified complication: Secondary | ICD-10-CM

## 2023-02-06 MED ORDER — ROSUVASTATIN CALCIUM 5 MG PO TABS
ORAL_TABLET | ORAL | 3 refills | Status: AC
Start: 2023-02-06 — End: ?

## 2023-02-09 ENCOUNTER — Other Ambulatory Visit: Payer: Self-pay | Admitting: Internal Medicine

## 2023-02-09 MED ORDER — DEXAMETHASONE 4 MG PO TABS: ORAL_TABLET | ORAL | 0 refills | Status: DC

## 2023-02-09 MED ORDER — AZITHROMYCIN 250 MG PO TABS: ORAL_TABLET | ORAL | 1 refills | Status: DC

## 2023-02-10 ENCOUNTER — Ambulatory Visit (INDEPENDENT_AMBULATORY_CARE_PROVIDER_SITE_OTHER): Payer: 59

## 2023-02-10 ENCOUNTER — Other Ambulatory Visit: Payer: Self-pay | Admitting: Internal Medicine

## 2023-02-10 ENCOUNTER — Ambulatory Visit (HOSPITAL_BASED_OUTPATIENT_CLINIC_OR_DEPARTMENT_OTHER)
Admission: RE | Admit: 2023-02-10 | Discharge: 2023-02-10 | Disposition: A | Discharge status: Home / Self Care | Payer: 59 | Source: Ambulatory Visit | Attending: Internal Medicine | Admitting: Internal Medicine

## 2023-02-10 VITALS — HR 105 | Temp 99.9°F

## 2023-02-10 DIAGNOSIS — R051 Acute cough: Secondary | ICD-10-CM | POA: Diagnosis not present

## 2023-02-10 DIAGNOSIS — R6889 Other general symptoms and signs: Secondary | ICD-10-CM | POA: Diagnosis not present

## 2023-02-10 DIAGNOSIS — Z1152 Encounter for screening for COVID-19: Secondary | ICD-10-CM

## 2023-02-10 LAB — POC COVID19 BINAXNOW: SARS Coronavirus 2 Ag: POSITIVE — AB

## 2023-02-10 NOTE — Progress Notes (Signed)
^<^<^<^<^<^<^<^<^<^<^<^<^<^<^<^<^<^<^<^<^<^<^<^<^<^<^<^<^<^<^<^<^<^<^<^<^ ^>^>^>^>^>^>^>^>^>^>^>>^>^>^>^>^>^>^>^>^>^>^>^>^>^>^>^>^>^>^>^>^>^>^>^>^>  -   OK - No Pneumonia  ^<^<^<^<^<^<^<^<^<^<^<^<^<^<^<^<^<^<^<^<^<^<^<^<^<^<^<^<^<^<^<^<^<^<^<^<^ ^>^>^>^>^>^>^>^>^>^>^>^>^>^>^>^>^>^>^>^>^>^>^>^>^>^>^>^>^>^>^>^>^>^>^>^>^

## 2023-02-10 NOTE — Progress Notes (Signed)
Patient presents to the office for a nurse visit to check for Covid. Symptoms began 4 days ago. Took a home test yesterday with positive results. Test results today in the office were also positive. Provider aware and will treat accordingly.

## 2023-04-03 ENCOUNTER — Encounter: Payer: Self-pay | Admitting: Nurse Practitioner

## 2023-04-03 ENCOUNTER — Other Ambulatory Visit: Payer: Self-pay | Admitting: Internal Medicine

## 2023-04-03 DIAGNOSIS — F5101 Primary insomnia: Secondary | ICD-10-CM

## 2023-04-03 MED ORDER — TRAZODONE HCL 100 MG PO TABS
ORAL_TABLET | ORAL | 3 refills | Status: AC
Start: 2023-04-03 — End: ?

## 2023-04-10 ENCOUNTER — Other Ambulatory Visit: Payer: Self-pay | Admitting: Nurse Practitioner

## 2023-04-11 ENCOUNTER — Other Ambulatory Visit: Payer: Self-pay | Admitting: Internal Medicine

## 2023-04-11 DIAGNOSIS — E1122 Type 2 diabetes mellitus with diabetic chronic kidney disease: Secondary | ICD-10-CM

## 2023-04-11 MED ORDER — OZEMPIC (1 MG/DOSE) 4 MG/3ML ~~LOC~~ SOPN: PEN_INJECTOR | SUBCUTANEOUS | 2 refills | Status: DC

## 2023-04-20 NOTE — Progress Notes (Signed)
FOLLOW UP  Assessment and Plan:   Atherosclerosis of aorta (HCC) - per CT 05/2020 Control blood pressure, cholesterol, glucose, increase exercise.   2 vessel CAD Has seen cardiology, benign stress test Continue statin for LDL goal <70, continue ASA, aggressive lifestyle interventions Go to the ER if any chest pain, shortness of breath, nausea, dizziness, severe HA, changes vision/speech  Hypertension Currently controlled with Lisinopril 5 mg QD Monitor blood pressure at home; patient to call if consistently greater than 130/80 Continue DASH diet.   Reminder to go to the ER if any CP, SOB, nausea, dizziness, severe HA, changes vision/speech, left arm numbness and tingling and jaw pain. - CBC - CMP  Hyperlipidemia associated with type 2 DM Currently above goal of LDL <70; titrate up on rosuvastatin as needed for goal; currently taking 5 mg 4 days/week  Continue low cholesterol diet and exercise.  Check lipid panel.   Diabetes with diabetic chronic kidney disease Continue medication: ozempic, holding metformin unless atypical elevations Continue diet and exercise.  Perform daily foot/skin check, notify office of any concerning changes.  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) Increase fluids, avoid NSAIDS, monitor sugars, will monitor -CBC - CMP  BPH with lower urinary tract symptoms Tamsulosin is helping stream- continue   Overweight BMI 26  Long discussion about weight loss, diet, and exercise Recommended diet heavy in fruits and veggies and low in animal meats, cheeses, and dairy products, appropriate calorie intake Discussed ideal weight for height  Continue current efforts with lifestyle modification Will follow up in 3 months  Vitamin D Def He is back on 50000 IU once a week which has worked well for him in the past; continue for goal 60-100 Defer Vit D  NAFLD Continue diet, exercise and monitor LFT's Avoid Tylenol and  alcohol -CMP  Medication management -     CBC with Differential/Platelet -     COMPLETE METABOLIC PANEL WITH GFR -     Lipid panel -     Hemoglobin A1C w/out eAG -     TSH  Seasonal allergic rhinitis, unspecified trigger -     fexofenadine (ALLEGRA) 180 MG tablet; Take 1 tablet (180 mg total) by mouth daily. -     fluticasone (FLONASE) 50 MCG/ACT nasal spray; Place 2 sprays into both nostrils daily.  Acute URI Push fluids Continue allegra and flonase daily Zpack as directed -     azithromycin (ZITHROMAX) 250 MG tablet; Take 2 tablets (500 mg) on  Day 1,  followed by 1 tablet (250 mg) once daily on Days 2 through 5.     Continue diet and meds as discussed. Further disposition pending results of labs. Discussed med's effects and SE's.   Over 30 minutes of exam, counseling, chart review, and critical decision making was performed.   Future Appointments  Date Time Provider Department Center  10/09/2023  2:00 PM Raynelle Dick, NP [facility] None    ----------------------------------------------------------------------------------------------------------------------  HPI 52 y.o. male  presents for 3 month follow up on hypertension, cholesterol, diabetes, weight and vitamin D deficiency.   He had fatty liver with elevated LFTs, recent External CT abd/pelvis w & w/o contrast 09/28/2020 showed prominent caudate lob and slight lobularity of the lateral segment left hepatic lob suspicious for early cirrhosis. No current significant hepatic steatosis. No liver mass - also known stable pancreatic cyst; GI Dr. Leone Payor is now following. GI now believes there was no NAFLD.  He continues to cough up  a thick light yellow mucus. He also has some sinus congestion. This has been occurring x several months. He has a lot of sinus pressure. When he blows his nose he does not get any mucus. He works nights and cough with mucus occurs throughout the day. Uses Allegra occasionally  BMI is Body mass  index is 26.37 kg/m., he has been working on diet and exercise. He is on ozempic. Reports drinks some diet soda (2/day), does drink 1 mountain dew on night shift. Doing lots of [place] wraps, eggs, chicken salad with low carb chips and fruit, will do atkins bars while at work.  He is down 18 pounds in the past 3 months Wt Readings from Last 3 Encounters:  04/24/23 205 lb 6.4 oz (93.2 kg)  01/16/23 223 lb 6.4 oz (101.3 kg)  10/03/22 225 lb 12.8 oz (102.4 kg)   His blood pressure has been controlled at home (118/70s, etc) on Lisinopril 5 mg QD( will take 1/2 if weight is closer to 200 because does not need whole dose), today their BP is BP: 124/74   BP Readings from Last 3 Encounters:  04/24/23 124/74  01/16/23 130/82  10/03/22 134/72  He does not workout but work physically active job. He denies chest pain, shortness of breath, dizziness.  CT 05/2020 showed aortic atherosclerosis, 2 vessel CAD, was initiated on rosuvastatin. Echocardiogram & Stress Myoview on 11/08/2021 were both Normal,    He is on cholesterol medication (was prescribed rosuvastatin 5 mg, taking 4 days a week M,W,F,Sat) and denies myalgias. His cholesterol is not at goal of LDL < 70. The cholesterol last visit was:   Lab Results  Component Value Date   CHOL 157 01/16/2023   HDL 36 (L) 01/16/2023   LDLCALC 92 01/16/2023   TRIG 196 (H) 01/16/2023   CHOLHDL 4.4 01/16/2023    He has been working on diet and exercise for T2 diabetes currently by ozempic 1 mg week (holding metformin due to well controlled glucose), and denies foot ulcerations, increased appetite, nausea, paresthesia of the feet, polydipsia, polyuria, visual disturbances, vomiting and weight loss.  He checks fasting glucose, typically 100-120 Last A1C in the office was:  Lab Results  Component Value Date   HGBA1C 5.9 (H) 01/16/2023   CKD II associated with T2DM monitored at this office, on lisinopril. Last GFR:  Lab Results  Component Value Date   EGFR 85  01/16/2023   Patient is on Vitamin D supplement and below goal at last check, reports has resumed taking 40981 IU weekly:    Lab Results  Component Value Date   VD25OH 64 10/03/2022     He is taking Tamsulosin for difficulty starting urine stream.  It has stopped the dribbling. He is having more urinary frequency.     Current Medications:  Current Outpatient Medications on File Prior to Visit  Medication Sig   azithromycin (ZITHROMAX) 250 MG tablet Take 2 tablets with Food on  Day 1, then 1 tablet Daily with Food for Sinusitis / Bronchitis   dexamethasone (DECADRON) 4 MG tablet Take 1 tab 3 x day - 3 days, then 2 x day - 3 days, then 1 tab daily   albuterol (VENTOLIN HFA) 108 (90 Base) MCG/ACT inhaler Inhale 1-2 puffs into the lungs every 4 (four) hours as needed for wheezing or shortness of breath.   APPLE CIDER VINEGAR PO Take 1 capsule by mouth in the morning and at bedtime.   aspirin EC 81 MG tablet Take 81  mg by mouth in the morning. Swallow whole.   Blood Glucose Monitoring Suppl (CONTOUR NEXT MONITOR) w/Device KIT Check blood sugar once daily   Cholecalciferol 1.25 MG (50000 UT) TABS Take 1 tablet by mouth once a week.   Cinnamon 500 MG capsule Take 500 mg by mouth 2 (two) times daily.    glucose blood (CONTOUR NEXT TEST) test strip Check blood sugar once daily   Lancets Micro Thin 33G MISC Check blood sugar once daily   lisinopril (ZESTRIL) 5 MG tablet TAKE 1 TABLET BY MOUTH EVERY DAY FOR BLOOD PRESSURE (Patient taking differently: Take 5 mg by mouth daily. TAKE 1 TABLET BY MOUTH EVERY DAY FOR BLOOD PRESSURE)   Multiple Vitamins-Minerals (MULTIVITAMIN MEN 50+) TABS Take by mouth daily.   rosuvastatin (CRESTOR) 5 MG tablet Take 1 tab 4 days a week for cholesterol (MWFSat)   Semaglutide, 1 MG/DOSE, (OZEMPIC, 1 MG/DOSE,) 4 MG/3ML SOPN Inject 1 mg (0.75 ml)  into Skin every 7 days for Diabetes  ( Dx: e11.29 )   tamsulosin (FLOMAX) 0.4 MG CAPS capsule Take 1 capsule (0.4 mg total) by  mouth daily.   traZODone (DESYREL) 100 MG tablet Take   1 tablet   at Bedtime   as needed for   Sleep                                                                         /                                                                   TAKE                                         BY                                                 MOUTH   zinc gluconate 50 MG tablet 50 mg by oral route.   No current facility-administered medications on file prior to visit.     Allergies: No Known Allergies   Medical History:  Past Medical History:  Diagnosis Date   2-vessel coronary artery disease 11/20/2020   Acute respiratory failure with hypoxia (HCC)    Allergy    Basal cell carcinoma (BCC) of skin of nose 03/11/2021   Chronic kidney disease    COVID-19    Diabetes mellitus without complication (HCC) 2012   Fatty liver    Hyperlipidemia    Hypertension 2012   Pancreatitis    Pneumonia due to COVID-19 virus 04/24/2020   Prostatitis 02/08/2018   Family history- Reviewed and unchanged Social history- Reviewed and unchanged   Review of Systems:  Review of Systems  Constitutional:  Negative for malaise/fatigue and weight loss.  HENT:  Positive for congestion. Negative for hearing loss and tinnitus.   Eyes:  Negative for blurred vision and double vision.  Respiratory:  Positive for cough and sputum production (yellow, thick). Negative for shortness of breath and wheezing.   Cardiovascular:  Negative for chest pain, palpitations, orthopnea, claudication and leg swelling.  Gastrointestinal:  Negative for abdominal pain, blood in stool, constipation, diarrhea, heartburn, melena, nausea and vomiting.  Genitourinary:  Negative for dysuria and urgency.       Urine stream has improved with tamsulosin  Musculoskeletal:  Positive for joint pain (elbows). Negative for myalgias and neck pain.  Skin:  Negative for rash.  Neurological:  Negative for dizziness, sensory change, weakness and headaches.   Endo/Heme/Allergies:  Negative for polydipsia.  Psychiatric/Behavioral: Negative.  Negative for depression. The patient is not nervous/anxious and does not have insomnia.   All other systems reviewed and are negative.   Physical Exam: BP 124/74   Pulse 77   Temp (!) 97.5 F (36.4 C)   Ht 6\' 2"  (1.88 m)   Wt 205 lb 6.4 oz (93.2 kg)   SpO2 98%   BMI 26.37 kg/m  Wt Readings from Last 3 Encounters:  04/24/23 205 lb 6.4 oz (93.2 kg)  01/16/23 223 lb 6.4 oz (101.3 kg)  10/03/22 225 lb 12.8 oz (102.4 kg)   General Appearance: Well nourished adults male, in no apparent distress Eyes: PERRLA, conjunctiva no swelling or erythema Sinuses: Positive maxillary tenderness ENT/Mouth: Ext aud canals clear, TMs without erythema, bulging. Post pharynx without swelling, or exudate on post pharynx. Hearing normal.  Neck: Supple Respiratory: Respiratory effort normal, BS equal bilaterally without wheezing Cardio: RRR with no MRGs. Brisk peripheral pulses without edema.  Abdomen: Soft, + BS.  Non tender, no rebound, no palpable organomegaly.  Lymphatics: Non tender without lymphadenopathy.  Musculoskeletal: Full ROM, 5/5 strength, Normal gait.  Skin: Warm, dry,tinea versicolor of back. Small callus of left lateral foot Neuro: Cranial nerves intact. No cerebellar symptoms. Distal sensation intact.  Psych: Awake and oriented X 3, normal affect, Insight and Judgment appropriate.   Raynelle Dick, NP 11:33 AM [facility]

## 2023-04-21 ENCOUNTER — Other Ambulatory Visit: Payer: Self-pay

## 2023-04-21 DIAGNOSIS — K7460 Unspecified cirrhosis of liver: Secondary | ICD-10-CM

## 2023-04-21 DIAGNOSIS — K862 Cyst of pancreas: Secondary | ICD-10-CM

## 2023-04-24 ENCOUNTER — Ambulatory Visit (INDEPENDENT_AMBULATORY_CARE_PROVIDER_SITE_OTHER): Payer: 59 | Admitting: Nurse Practitioner

## 2023-04-24 ENCOUNTER — Encounter: Payer: Self-pay | Admitting: Nurse Practitioner

## 2023-04-24 VITALS — BP 124/74 | HR 77 | Temp 97.5°F | Ht 74.0 in | Wt 205.4 lb

## 2023-04-24 DIAGNOSIS — N401 Enlarged prostate with lower urinary tract symptoms: Secondary | ICD-10-CM

## 2023-04-24 DIAGNOSIS — J069 Acute upper respiratory infection, unspecified: Secondary | ICD-10-CM

## 2023-04-24 DIAGNOSIS — I2510 Atherosclerotic heart disease of native coronary artery without angina pectoris: Secondary | ICD-10-CM

## 2023-04-24 DIAGNOSIS — E559 Vitamin D deficiency, unspecified: Secondary | ICD-10-CM

## 2023-04-24 DIAGNOSIS — E663 Overweight: Secondary | ICD-10-CM

## 2023-04-24 DIAGNOSIS — K760 Fatty (change of) liver, not elsewhere classified: Secondary | ICD-10-CM

## 2023-04-24 DIAGNOSIS — I10 Essential (primary) hypertension: Secondary | ICD-10-CM

## 2023-04-24 DIAGNOSIS — Z79899 Other long term (current) drug therapy: Secondary | ICD-10-CM

## 2023-04-24 DIAGNOSIS — E1169 Type 2 diabetes mellitus with other specified complication: Secondary | ICD-10-CM | POA: Diagnosis not present

## 2023-04-24 DIAGNOSIS — E1122 Type 2 diabetes mellitus with diabetic chronic kidney disease: Secondary | ICD-10-CM

## 2023-04-24 DIAGNOSIS — N138 Other obstructive and reflux uropathy: Secondary | ICD-10-CM

## 2023-04-24 DIAGNOSIS — N182 Chronic kidney disease, stage 2 (mild): Secondary | ICD-10-CM

## 2023-04-24 DIAGNOSIS — J302 Other seasonal allergic rhinitis: Secondary | ICD-10-CM

## 2023-04-24 DIAGNOSIS — Z6826 Body mass index (BMI) 26.0-26.9, adult: Secondary | ICD-10-CM

## 2023-04-24 DIAGNOSIS — E785 Hyperlipidemia, unspecified: Secondary | ICD-10-CM

## 2023-04-24 DIAGNOSIS — I700 Atherosclerosis of aorta: Secondary | ICD-10-CM | POA: Diagnosis not present

## 2023-04-24 DIAGNOSIS — Z6828 Body mass index (BMI) 28.0-28.9, adult: Secondary | ICD-10-CM

## 2023-04-24 LAB — CBC WITH DIFFERENTIAL/PLATELET
Absolute Monocytes: 578 cells/uL (ref 200–950)
Basophils Absolute: 74 cells/uL (ref 0–200)
Basophils Relative: 0.7 %
Eosinophils Absolute: 137 cells/uL (ref 15–500)
Eosinophils Relative: 1.3 %
HCT: 47.3 % (ref 38.5–50.0)
Hemoglobin: 15.9 g/dL (ref 13.2–17.1)
Lymphs Abs: 2258 cells/uL (ref 850–3900)
MCH: 29.7 pg (ref 27.0–33.0)
MCHC: 33.6 g/dL (ref 32.0–36.0)
MCV: 88.4 fL (ref 80.0–100.0)
MPV: 11.5 fL (ref 7.5–12.5)
Monocytes Relative: 5.5 %
Neutro Abs: 7455 cells/uL (ref 1500–7800)
Neutrophils Relative %: 71 %
Platelets: 242 10*3/uL (ref 140–400)
RBC: 5.35 10*6/uL (ref 4.20–5.80)
RDW: 12 % (ref 11.0–15.0)
Total Lymphocyte: 21.5 %
WBC: 10.5 10*3/uL (ref 3.8–10.8)

## 2023-04-24 MED ORDER — FLUTICASONE PROPIONATE 50 MCG/ACT NA SUSP
2.0000 | Freq: Every day | NASAL | 2 refills | Status: DC
Start: 2023-04-24 — End: 2023-07-17

## 2023-04-24 MED ORDER — FEXOFENADINE HCL 180 MG PO TABS
180.0000 mg | ORAL_TABLET | Freq: Every day | ORAL | 5 refills | Status: DC
Start: 2023-04-24 — End: 2023-09-24

## 2023-04-24 MED ORDER — AZITHROMYCIN 250 MG PO TABS
ORAL_TABLET | ORAL | 1 refills | Status: DC
Start: 2023-04-24 — End: 2023-08-07

## 2023-04-24 NOTE — Patient Instructions (Signed)
Tennis Elbow  Tennis elbow is irritation and swelling (inflammation) in your outer forearm, near your elbow. Swelling affects the tissues that connect muscle to bone (tendons). Tennis elbow can happen playing any sport or doing any job where you use your elbow too much. It is caused by doing the same motion over and over. What are the causes? This condition is often caused by playing sports or doing work where you need to keep moving your forearm the same way. Sometimes, it may be caused by a sudden injury. What increases the risk? You are more likely to get tennis elbow if you play tennis or another racket sport. You also have a higher risk if you often use your hands for work. This includes: People who use computers. Holiday representative workers. People who work in a factory. Musicians. Cooks. Cashiers. What are the signs or symptoms? Pain and tenderness in your forearm and the outer part of your elbow. You may have pain all the time or only when you use your arm. A burning feeling. This starts in your elbow and spreads down your arm. A weak grip in your hand. How is this treated? Resting and icing your arm is often the first treatment. Your doctor may also recommend: Medicines to reduce pain and swelling. An elbow strap. Physical therapy. This may include massage or exercises or both. An elbow brace. If these do not help your symptoms get better, your doctor may recommend surgery. Follow these instructions at home: If you have a brace or strap: Wear the brace or strap as told by your doctor. Take it off only as told by your doctor. Check the skin around the brace or strap every day. Tell your doctor if you see problems. Loosen it if your fingers: Tingle. Become numb. Turn cold and blue. Keep the brace or strap clean. If the brace or strap is not waterproof: Do not let it get wet. Cover it with a watertight covering when you take a bath or a shower. Managing pain, stiffness, and  swelling  If told, put ice on the injured area. To do this: If you have a removable brace or strap, take it off as told by your doctor. Put ice in a plastic bag. Place a towel between your skin and the bag. Leave the ice on for 20 minutes, 2-3 times a day. Take off the ice if your skin turns bright red. This is very important. If you cannot feel pain, heat, or cold, you have a greater risk of damage to the area. Move your fingers often. Activity Rest your elbow and wrist. Avoid activities that can cause elbow problems as told by your doctor. Do exercises as told by your doctor. If you lift an object, lift it with your palm facing up. Lifestyle If your tennis elbow is caused by sports, check your equipment and make sure that: You are using it the right way. It fits you well. If your tennis elbow is caused by work or computer use, take breaks often to stretch your arm. Talk with your manager about how you can make your condition better at work. General instructions Take over-the-counter and prescription medicines only as told by your doctor. Do not smoke or use any products that contain nicotine or tobacco. If you need help quitting, ask your doctor. Keep all follow-up visits. How is this prevented? Before and after being active: Warm up and stretch before being active. Cool down and stretch after being active. Give your body time to  rest between activities. While being active: Make sure to use equipment that fits you. If you play tennis, put power in your stroke with your lower body. Avoid using your arm only. Maintain physical fitness. This includes: Strength. Flexibility. Endurance. Do exercises to strengthen the forearm muscles. Contact a doctor if: Your pain does not get better with treatment. Your pain gets worse. You have weakness in your forearm, hand, or fingers. You cannot feel your forearm, hand, or fingers. Get help right away if: Your pain is very bad. You cannot  move your wrist. Summary Tennis elbow is irritation and swelling (inflammation) in your outer forearm, near your elbow. Tennis elbow is caused by doing the same motion over and over. Rest your elbow and wrist. Avoid activities as told by your doctor. If told, put ice on the injured area for 20 minutes, 2-3 times a day. This information is not intended to replace advice given to you by your health care provider. Make sure you discuss any questions you have with your health care provider. Document Revised: 03/17/2020 Document Reviewed: 03/17/2020 Elsevier Patient Education  2024 [employer].

## 2023-05-03 ENCOUNTER — Other Ambulatory Visit: Payer: Self-pay

## 2023-05-03 DIAGNOSIS — I10 Essential (primary) hypertension: Secondary | ICD-10-CM

## 2023-05-03 MED ORDER — LISINOPRIL 5 MG PO TABS
ORAL_TABLET | ORAL | 3 refills | Status: AC
Start: 2023-05-03 — End: ?

## 2023-05-13 ENCOUNTER — Other Ambulatory Visit: Payer: Self-pay | Admitting: Nurse Practitioner

## 2023-05-13 DIAGNOSIS — N401 Enlarged prostate with lower urinary tract symptoms: Secondary | ICD-10-CM

## 2023-06-19 ENCOUNTER — Telehealth: Payer: Self-pay

## 2023-06-19 NOTE — Telephone Encounter (Signed)
Reminder received in Epic. Pt made aware. Pt was ordered and scheduled for an MRI on Tuesday 06/27/2023 at 10:00 AM at [facility].  Pt to arrive at 9:30 AM.  Nothing to eat or drink 4 hours prior. Pt made aware. Pt verbalized understanding with all questions answered.

## 2023-06-19 NOTE — Telephone Encounter (Signed)
-----   Message from Nurse Joselyn Glassman sent at 09/08/2022 10:02 AM EST ----- Personal message sent on 09/08/2022  3) MR/MRCP in Oct 2024 (place reminder) to f/u pancreas cysts and also recheck liver

## 2023-06-21 ENCOUNTER — Telehealth: Payer: Self-pay

## 2023-06-21 NOTE — Telephone Encounter (Signed)
Reminder received. Pt chart was reviewed and noted that pt has been scheduled for the MRI on 06/27/2023 at 10:00 AM

## 2023-06-21 NOTE — Telephone Encounter (Signed)
-----   Message from Nurse Joselyn Glassman sent at 09/08/2022 10:26 AM EST ----- Reminder sent 09/08/2022  MR/MRCP in Oct 2024 (place reminder) to f/u pancreas cysts and also recheck liver

## 2023-06-27 ENCOUNTER — Other Ambulatory Visit: Payer: Self-pay | Admitting: Internal Medicine

## 2023-06-27 ENCOUNTER — Ambulatory Visit (HOSPITAL_COMMUNITY)
Admission: RE | Admit: 2023-06-27 | Discharge: 2023-06-27 | Disposition: A | Discharge status: Home / Self Care | Payer: 59 | Source: Ambulatory Visit | Attending: Internal Medicine | Admitting: Internal Medicine

## 2023-06-27 DIAGNOSIS — K862 Cyst of pancreas: Secondary | ICD-10-CM | POA: Diagnosis present

## 2023-06-27 DIAGNOSIS — K7460 Unspecified cirrhosis of liver: Secondary | ICD-10-CM | POA: Insufficient documentation

## 2023-06-27 MED ORDER — GADOBUTROL 1 MMOL/ML IV SOLN
9.0000 mL | Freq: Once | INTRAVENOUS | Status: AC | PRN
  Administered 2023-06-27: 9 mL via INTRAVENOUS

## 2023-06-29 ENCOUNTER — Other Ambulatory Visit: Payer: Self-pay | Admitting: Nurse Practitioner

## 2023-06-29 DIAGNOSIS — E559 Vitamin D deficiency, unspecified: Secondary | ICD-10-CM

## 2023-07-10 ENCOUNTER — Other Ambulatory Visit: Payer: Self-pay | Admitting: Internal Medicine

## 2023-07-10 DIAGNOSIS — E1122 Type 2 diabetes mellitus with diabetic chronic kidney disease: Secondary | ICD-10-CM

## 2023-07-16 ENCOUNTER — Other Ambulatory Visit: Payer: Self-pay | Admitting: Nurse Practitioner

## 2023-07-16 DIAGNOSIS — J302 Other seasonal allergic rhinitis: Secondary | ICD-10-CM

## 2023-07-31 ENCOUNTER — Ambulatory Visit: Payer: 59 | Admitting: Nurse Practitioner

## 2023-08-03 NOTE — Progress Notes (Signed)
FOLLOW UP  Assessment and Plan:   Atherosclerosis of aorta (HCC) - per CT 05/2020 Control blood pressure, cholesterol, glucose, increase exercise.   2 vessel CAD Has seen cardiology, benign stress test Continue statin for LDL goal <70, continue ASA, aggressive lifestyle interventions Go to the ER if any chest pain, shortness of breath, nausea, dizziness, severe HA, changes vision/speech  Hypertension Currently controlled with Lisinopril 5 mg QD Monitor blood pressure at home; patient to call if consistently greater than 130/80 Continue DASH diet.   Reminder to go to the ER if any CP, SOB, nausea, dizziness, severe HA, changes vision/speech, left arm numbness and tingling and jaw pain. - CBC - CMP  Hyperlipidemia associated with type 2 DM Currently above goal of LDL <70; titrate up on rosuvastatin as needed for goal; currently taking 5 mg 4 days/week  Continue low cholesterol diet and exercise.  Check lipid panel.   Diabetes with diabetic chronic kidney disease Continue medication: ozempic, holding metformin unless atypical elevations Continue diet and exercise.  Perform daily foot/skin check, notify office of any concerning changes.  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) Increase fluids, avoid NSAIDS, monitor sugars, will monitor -CBC - CMP  BPH with lower urinary tract symptoms Tamsulosin is helping stream- continue  Fatigue/daytime somnolence / snoring - Referred for sleep study - Testosterone - Start zinc 50 mg qd  Overweight BMI 26  Long discussion about weight loss, diet, and exercise Recommended diet heavy in fruits and veggies and low in animal meats, cheeses, and dairy products, appropriate calorie intake Discussed ideal weight for height  Continue current efforts with lifestyle modification Will follow up in 3 months  Vitamin D Def He is back on 50000 IU once a week which has worked well for him in the past; continue for goal  60-100 Defer Vit D  NAFLD Continue diet, exercise and monitor LFT's Avoid Tylenol and alcohol -CMP  Chronic Cough - Try Breztri 2 puffs BID, monitor symptoms and if does well will continue - Continue Allegra and Flonase daily and albuterol as needed  Medication management -     CBC with Differential/Platelet -     COMPLETE METABOLIC PANEL WITH GFR -     Lipid panel -     Hemoglobin A1C w/out eAG -     TSH     Continue diet and meds as discussed. Further disposition pending results of labs. Discussed med's effects and SE's.   Over 30 minutes of exam, counseling, chart review, and critical decision making was performed.   Future Appointments  Date Time Provider Department Center  08/07/2023 11:45 AM Raynelle Dick, NP [facility] None  10/09/2023  2:00 PM Raynelle Dick, NP [facility] None    ----------------------------------------------------------------------------------------------------------------------  HPI 52 y.o. male  presents for 3 month follow up on hypertension, cholesterol, diabetes, weight and vitamin D deficiency.   He had fatty liver with elevated LFTs, recent External CT abd/pelvis w & w/o contrast 09/28/2020 showed prominent caudate lob and slight lobularity of the lateral segment left hepatic lob suspicious for early cirrhosis. No current significant hepatic steatosis. No liver mass - also known stable pancreatic cyst; GI Dr. Leone Payor is now following. GI now believes there was no NAFLD.  He has been snoring more recently.  He wakes up tired, never feels like he is getting enough sleep. A lot of daytime somnolence. He is a night shift Financial controller.   He is having productive cough of green/gray mucus.  He does also have some sinus congestion. Uses Flonase and Allegra daily. The symptoms persist even after treatment with antibiotics. Occurs daily. Will occasionally get a headache related to sinuses.   BMI is Body mass index is 27.24 kg/m., he has been working on  diet and exercise. He is on ozempic. Reports drinks some diet soda (2/day), does drink 1 mountain dew on night shift. He has been keeping weight around 207, had a bad weekend of eating.  Wt Readings from Last 3 Encounters:  08/07/23 212 lb 3.2 oz (96.3 kg)  04/24/23 205 lb 6.4 oz (93.2 kg)  01/16/23 223 lb 6.4 oz (101.3 kg)   His blood pressure has been controlled at home (118/70s, etc) on Lisinopril 5 mg QD, today their BP is BP: 122/78   BP Readings from Last 3 Encounters:  08/07/23 122/78  04/24/23 124/74  01/16/23 130/82  He does not workout but works a physically active job. He denies chest pain, shortness of breath, dizziness.  CT 05/2020 showed aortic atherosclerosis, 2 vessel CAD, was initiated on rosuvastatin. Echocardiogram & Stress Myoview on 11/08/2021 were both Normal,    He is on cholesterol medication (was prescribed rosuvastatin 5 mg, taking 4 days a week M,W,F,Sat) and denies myalgias. His cholesterol is not at goal of LDL < 70. The cholesterol last visit was:   Lab Results  Component Value Date   CHOL 118 04/24/2023   HDL 41 04/24/2023   LDLCALC 56 04/24/2023   TRIG 127 04/24/2023   CHOLHDL 2.9 04/24/2023    He has been working on diet and exercise for T2 diabetes currently by ozempic 1 mg week , and denies foot ulcerations, increased appetite, nausea, paresthesia of the feet, polydipsia, polyuria, visual disturbances, vomiting and weight loss.  He checks fasting glucose, 127 this am Last A1C in the office was:  Lab Results  Component Value Date   HGBA1C 5.6 04/24/2023   CKD II associated with T2DM monitored at this office, on lisinopril. Last GFR:  Lab Results  Component Value Date   EGFR 80 04/24/2023   Patient is on Vitamin D supplement and below goal at last check, reports has resumed taking 08657 IU weekly:    Lab Results  Component Value Date   VD25OH 64 10/03/2022     He is taking Tamsulosin for difficulty starting urine stream.  It has stopped the  dribbling. He is having more urinary frequency.  He is having constant fatigue Lab Results  Component Value Date   TESTOSTERONE 423 10/03/2022      Current Medications:  Current Outpatient Medications on File Prior to Visit  Medication Sig   albuterol (VENTOLIN HFA) 108 (90 Base) MCG/ACT inhaler Inhale 1-2 puffs into the lungs every 4 (four) hours as needed for wheezing or shortness of breath.   APPLE CIDER VINEGAR PO Take 1 capsule by mouth in the morning and at bedtime.   aspirin EC 81 MG tablet Take 81 mg by mouth in the morning. Every 3rd day   Blood Glucose Monitoring Suppl (CONTOUR NEXT MONITOR) w/Device KIT Check blood sugar once daily   Cholecalciferol (VITAMIN D3) 1.25 MG (50000 UT) CAPS TAKE 1 CAP BY MOUTH ONE TIME PER WEEK   Cinnamon 500 MG capsule Take 500 mg by mouth 2 (two) times daily.    fexofenadine (ALLEGRA) 180 MG tablet Take 1 tablet (180 mg total) by mouth daily.   fluticasone (FLONASE) 50 MCG/ACT nasal spray SPRAY 2 SPRAYS INTO EACH NOSTRIL EVERY DAY  glucose blood (CONTOUR NEXT TEST) test strip Check blood sugar once daily   Lancets Micro Thin 33G MISC Check blood sugar once daily   lisinopril (ZESTRIL) 5 MG tablet TAKE 1 TABLET BY MOUTH EVERY DAY FOR BLOOD PRESSURE   Multiple Vitamins-Minerals (MULTIVITAMIN MEN 50+) TABS Take by mouth daily.   rosuvastatin (CRESTOR) 5 MG tablet Take 1 tab 4 days a week for cholesterol (MWFSat)   Semaglutide, 1 MG/DOSE, (OZEMPIC, 1 MG/DOSE,) 4 MG/3ML SOPN INJECT 1 MG (0.75 ML) INTO SKIN EVERY 7 DAYS FOR DIABETES ( DX: E11.29 )   tamsulosin (FLOMAX) 0.4 MG CAPS capsule Take  1 capsule  Daily for  Prostate                                                       /                                                                   TAKE                                         BY                                                 MOUTH   traZODone (DESYREL) 100 MG tablet Take   1 tablet   at Bedtime   as needed for   Sleep                                                                          /                                                                   TAKE                                         BY                                                 MOUTH   azithromycin (ZITHROMAX) 250 MG tablet Take 2 tablets (500 mg) on  Day 1,  followed by  1 tablet (250 mg) once daily on Days 2 through 5. (Patient not taking: Reported on 08/07/2023)   zinc gluconate 50 MG tablet 50 mg by oral route. (Patient not taking: Reported on 08/07/2023)   No current facility-administered medications on file prior to visit.     Allergies: No Known Allergies   Medical History:  Past Medical History:  Diagnosis Date   2-vessel coronary artery disease 11/20/2020   Acute respiratory failure with hypoxia (HCC)    Allergy    Basal cell carcinoma (BCC) of skin of nose 03/11/2021   Chronic kidney disease    COVID-19    Diabetes mellitus without complication (HCC) 2012   Fatty liver    Hyperlipidemia    Hypertension 2012   Pancreatitis    Pneumonia due to COVID-19 virus 04/24/2020   Prostatitis 02/08/2018   Family history- Reviewed and unchanged Social history- Reviewed and unchanged   Review of Systems:  Review of Systems  Constitutional:  Negative for malaise/fatigue and weight loss.  HENT:  Positive for congestion. Negative for hearing loss and tinnitus.   Eyes:  Negative for blurred vision and double vision.  Respiratory:  Positive for cough and sputum production (yellow, thick). Negative for shortness of breath and wheezing.   Cardiovascular:  Negative for chest pain, palpitations, orthopnea, claudication and leg swelling.  Gastrointestinal:  Negative for abdominal pain, blood in stool, constipation, diarrhea, heartburn, melena, nausea and vomiting.  Genitourinary:  Negative for dysuria and urgency.       Urine stream has improved with tamsulosin  Musculoskeletal:  Negative for joint pain, myalgias and neck pain.  Skin:  Negative for rash.   Neurological:  Negative for dizziness, sensory change, weakness and headaches.  Endo/Heme/Allergies:  Negative for polydipsia.  Psychiatric/Behavioral: Negative.  Negative for depression. The patient is not nervous/anxious and does not have insomnia.   All other systems reviewed and are negative.   Physical Exam: BP 122/78   Pulse 90   Temp 97.9 F (36.6 C)   Ht 6\' 2"  (1.88 m)   Wt 212 lb 3.2 oz (96.3 kg)   SpO2 95%   BMI 27.24 kg/m  Wt Readings from Last 3 Encounters:  08/07/23 212 lb 3.2 oz (96.3 kg)  04/24/23 205 lb 6.4 oz (93.2 kg)  01/16/23 223 lb 6.4 oz (101.3 kg)   General Appearance: Well nourished adults male, in no apparent distress Eyes: PERRLA, conjunctiva no swelling or erythema Sinuses: Positive maxillary tenderness ENT/Mouth: Ext aud canals clear, TMs without erythema, bulging. Post pharynx without swelling, or exudate on post pharynx. Hearing normal.  Neck: Supple, thyroid not enlarged Respiratory: Respiratory effort normal, BS equal bilaterally without wheezing, crackles Cardio: RRR with no MRGs. Brisk peripheral pulses without edema.  Abdomen: Soft, + BS.  Non tender, no rebound, no palpable organomegaly.  Lymphatics: Non tender without lymphadenopathy.  Musculoskeletal: Full ROM, 5/5 strength, Normal gait.  Skin: Warm, dry Neuro: Cranial nerves intact. No cerebellar symptoms. Distal sensation intact.  Psych: Awake and oriented X 3, normal affect, Insight and Judgment appropriate.   Raynelle Dick, NP 11:38 AM [facility]

## 2023-08-07 ENCOUNTER — Encounter: Payer: Self-pay | Admitting: Nurse Practitioner

## 2023-08-07 ENCOUNTER — Ambulatory Visit (INDEPENDENT_AMBULATORY_CARE_PROVIDER_SITE_OTHER): Payer: 59 | Admitting: Nurse Practitioner

## 2023-08-07 VITALS — BP 122/78 | HR 90 | Temp 97.9°F | Ht 74.0 in | Wt 212.2 lb

## 2023-08-07 DIAGNOSIS — Z6826 Body mass index (BMI) 26.0-26.9, adult: Secondary | ICD-10-CM

## 2023-08-07 DIAGNOSIS — R053 Chronic cough: Secondary | ICD-10-CM

## 2023-08-07 DIAGNOSIS — E1169 Type 2 diabetes mellitus with other specified complication: Secondary | ICD-10-CM

## 2023-08-07 DIAGNOSIS — E785 Hyperlipidemia, unspecified: Secondary | ICD-10-CM

## 2023-08-07 DIAGNOSIS — I10 Essential (primary) hypertension: Secondary | ICD-10-CM

## 2023-08-07 DIAGNOSIS — R0683 Snoring: Secondary | ICD-10-CM

## 2023-08-07 DIAGNOSIS — Z79899 Other long term (current) drug therapy: Secondary | ICD-10-CM

## 2023-08-07 DIAGNOSIS — E559 Vitamin D deficiency, unspecified: Secondary | ICD-10-CM

## 2023-08-07 DIAGNOSIS — R400 Somnolence: Secondary | ICD-10-CM

## 2023-08-07 DIAGNOSIS — N182 Chronic kidney disease, stage 2 (mild): Secondary | ICD-10-CM

## 2023-08-07 DIAGNOSIS — R5383 Other fatigue: Secondary | ICD-10-CM

## 2023-08-07 DIAGNOSIS — I2510 Atherosclerotic heart disease of native coronary artery without angina pectoris: Secondary | ICD-10-CM

## 2023-08-07 DIAGNOSIS — K760 Fatty (change of) liver, not elsewhere classified: Secondary | ICD-10-CM

## 2023-08-07 DIAGNOSIS — E663 Overweight: Secondary | ICD-10-CM

## 2023-08-07 DIAGNOSIS — I700 Atherosclerosis of aorta: Secondary | ICD-10-CM | POA: Diagnosis not present

## 2023-08-07 DIAGNOSIS — E1122 Type 2 diabetes mellitus with diabetic chronic kidney disease: Secondary | ICD-10-CM

## 2023-08-07 MED ORDER — BREZTRI AEROSPHERE 160-9-4.8 MCG/ACT IN AERO
2.0000 | INHALATION_SPRAY | Freq: Two times a day (BID) | RESPIRATORY_TRACT | Status: AC
Start: 2023-08-07 — End: ?

## 2023-08-07 NOTE — Patient Instructions (Signed)
 Chronic Cough Coughing is a reflex that clears your throat and airways (respiratory system). It helps heal and protect your lungs. It is normal to cough from time to time. A cough that happens with other symptoms or that lasts a long time may be a sign of a condition that needs treatment. A long-term (chronic) cough may last 8 or more weeks. There are two types of chronic cough: A symptomatic chronic cough. This is caused by a disease that can be found and treated. A refractory chronic cough. This is a cough that does not go away with testing and treatment. A chronic cough may be caused by: Long-term lung diseases. These include chronic obstructive pulmonary disease (COPD), asthma, and pulmonary fibrosis. Upper airway problems. These include allergies, sinusitis, and gastric reflux. Some medicines. Smoking. Follow these instructions at home: Medicines Take over-the-counter and prescription medicines only as told by your health care provider. Ask your provider about getting a flu (influenza) or pneumonia vaccine. Managing a sore or dry throat If your throat is sore or dry, gargle with a mixture of salt and water 3-4 times a day or as needed. To make salt water, completely dissolve -1 tsp (3-6 g) of salt in 1 cup (237 mL) of warm water. Soothe your throat with a cough drop or honey. A dry throat may make your cough worse. Use a cool mist vaporizer at home to add moisture to the air. Lifestyle Avoid cigarette smoke. Do not use any products that contain nicotine or tobacco. These products include cigarettes, chewing tobacco, and vaping devices, such as e-cigarettes. If you need help quitting, ask your provider. Avoid things that may irritate your throat or trigger your allergies. General instructions  Drink enough fluid to keep your pee (urine) pale yellow. Always cover your mouth when you cough. Stay away from people who are sick. Getting a cold or the flu can make your cough worse. Wash your  hands often with soap and water for at least 20 seconds. If soap and water are not available, use hand sanitizer. Contact a health care provider if: Your cough gets worse. You have a fever or chills. You are short of breath. Get help right away if: You have trouble breathing. You have chest pain. These symptoms may be an emergency. Get help right away. Call 911. Do not wait to see if the symptoms will go away. Do not drive yourself to the hospital. This information is not intended to replace advice given to you by your health care provider. Make sure you discuss any questions you have with your health care provider. Document Revised: 09/29/2022 Document Reviewed: 05/19/2022 Elsevier Patient Education  2024 [employer].

## 2023-08-08 LAB — HEMOGLOBIN A1C W/OUT EAG: Hgb A1c MFr Bld: 5.6 %{Hb} (ref <5.7)

## 2023-08-08 LAB — CBC WITH DIFFERENTIAL/PLATELET
Absolute Lymphocytes: 2318 {cells}/uL (ref 850–3900)
Absolute Monocytes: 551 {cells}/uL (ref 200–950)
Basophils Absolute: 76 {cells}/uL (ref 0–200)
Basophils Relative: 0.8 %
Eosinophils Absolute: 219 {cells}/uL (ref 15–500)
Eosinophils Relative: 2.3 %
HCT: 48.8 % (ref 38.5–50.0)
Hemoglobin: 16.5 g/dL (ref 13.2–17.1)
MCH: 30 pg (ref 27.0–33.0)
MCHC: 33.8 g/dL (ref 32.0–36.0)
MCV: 88.7 fL (ref 80.0–100.0)
MPV: 12.1 fL (ref 7.5–12.5)
Monocytes Relative: 5.8 %
Neutro Abs: 6337 {cells}/uL (ref 1500–7800)
Neutrophils Relative %: 66.7 %
Platelets: 223 10*3/uL (ref 140–400)
RBC: 5.5 10*6/uL (ref 4.20–5.80)
RDW: 12 % (ref 11.0–15.0)
Total Lymphocyte: 24.4 %
WBC: 9.5 10*3/uL (ref 3.8–10.8)

## 2023-08-08 LAB — LIPID PANEL
Cholesterol: 136 mg/dL (ref <200)
HDL: 43 mg/dL (ref >=40)
LDL Cholesterol (Calc): 66 mg/dL
Non-HDL Cholesterol (Calc): 93 mg/dL (ref <130)
Total CHOL/HDL Ratio: 3.2 (calc) (ref <5.0)
Triglycerides: 208 mg/dL — ABNORMAL HIGH (ref <150)

## 2023-08-08 LAB — COMPLETE METABOLIC PANEL WITH GFR
AG Ratio: 2 (calc) (ref 1.0–2.5)
ALT: 25 U/L (ref 9–46)
AST: 17 U/L (ref 10–35)
Albumin: 4.6 g/dL (ref 3.6–5.1)
Alkaline phosphatase (APISO): 50 U/L (ref 35–144)
BUN: 17 mg/dL (ref 7–25)
CO2: 24 mmol/L (ref 20–32)
Calcium: 9.7 mg/dL (ref 8.6–10.3)
Chloride: 107 mmol/L (ref 98–110)
Creat: 1.07 mg/dL (ref 0.70–1.30)
Globulin: 2.3 g/dL (ref 1.9–3.7)
Glucose, Bld: 118 mg/dL — ABNORMAL HIGH (ref 65–99)
Potassium: 4.5 mmol/L (ref 3.5–5.3)
Sodium: 141 mmol/L (ref 135–146)
Total Bilirubin: 0.5 mg/dL (ref 0.2–1.2)
Total Protein: 6.9 g/dL (ref 6.1–8.1)
eGFR: 83 mL/min/{1.73_m2} (ref >=60)

## 2023-08-08 LAB — TESTOSTERONE: Testosterone: 489 ng/dL (ref 250–827)

## 2023-08-16 ENCOUNTER — Other Ambulatory Visit: Payer: Self-pay | Admitting: Nurse Practitioner

## 2023-08-16 DIAGNOSIS — E1122 Type 2 diabetes mellitus with diabetic chronic kidney disease: Secondary | ICD-10-CM

## 2023-08-16 MED ORDER — METFORMIN HCL ER 500 MG PO TB24
500.0000 mg | ORAL_TABLET | Freq: Every day | ORAL | 6 refills | Status: DC
Start: 2023-08-16 — End: 2023-10-01

## 2023-09-04 ENCOUNTER — Telehealth: Payer: Self-pay | Admitting: Neurology

## 2023-09-04 ENCOUNTER — Institutional Professional Consult (permissible substitution): Payer: 59 | Admitting: Neurology

## 2023-09-04 NOTE — Telephone Encounter (Signed)
Pt cancelling appointment due to nauseated, headache, stuffy nose. Transferred to Billing

## 2023-09-22 ENCOUNTER — Ambulatory Visit (INDEPENDENT_AMBULATORY_CARE_PROVIDER_SITE_OTHER): Payer: 59 | Admitting: Nurse Practitioner

## 2023-09-22 ENCOUNTER — Encounter: Payer: Self-pay | Admitting: Nurse Practitioner

## 2023-09-22 VITALS — BP 110/78 | HR 74 | Temp 98.4°F | Ht 74.0 in | Wt 211.0 lb

## 2023-09-22 DIAGNOSIS — K429 Umbilical hernia without obstruction or gangrene: Secondary | ICD-10-CM | POA: Diagnosis not present

## 2023-09-22 DIAGNOSIS — I10 Essential (primary) hypertension: Secondary | ICD-10-CM

## 2023-09-22 NOTE — Progress Notes (Signed)
 Assessment and Plan:  Gregory Jacobs was seen today for an episodic visit.  Diagnoses and all order for this visit:  1. Umbilical hernia without obstruction and without gangrene (Primary) Continue to monitor for increase in size, pain, discoloration to skin, N/V, change in bowel pattern. Contact office if noticed.  - Ambulatory referral to General Surgery  2. Essential hypertension Controlled Discussed DASH (Dietary Approaches to Stop Hypertension) DASH diet is lower in sodium than a typical American diet. Cut back on foods that are high in saturated fat, cholesterol, and trans fats. Eat more whole-grain foods, fish, poultry, and nuts Remain active and exercise as tolerated daily.   Notify office for further evaluation and treatment, questions or concerns if s/s fail to improve. The risks and benefits of my recommendations, as well as other treatment options were discussed with the patient today. Questions were answered.  Further disposition pending results of labs. Discussed med's effects and SE's.    Over 20 minutes of exam, counseling, chart review, and critical decision making was performed.   Future Appointments  Date Time Provider Department Center  10/09/2023  2:00 PM Wilkinson, Dana E, NP [facility] None    ------------------------------------------------------------------------------------------------------------------   HPI BP 110/78   Pulse 74   Temp 98.4 F (36.9 C)   Ht 6' 2 (1.88 m)   Wt 211 lb (95.7 kg)   SpO2 99%   BMI 27.09 kg/m   Patient presents for evaluation of umbilical hernia. Patient has symptoms of bulging, hernia pain, including tenderness to palpation, which are made worse with palpation only. Symptoms were first noted greater than 1 year ago. 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 Pain is dull. Lump is not reducible. Pt has no symptoms of  chronic constipation, chronic cough, difficulty urinating.  Pt. Has had previous hx of abd surgery which includes cholecystectomy.  BP has remained well controlled and stable on Lisinopril .  Reports medication compliance.   BP Readings from Last 3 Encounters:  09/22/23 110/78  08/07/23 122/78  04/24/23 124/74      Past Medical History:  Diagnosis Date   2-vessel coronary artery disease 11/20/2020   Acute respiratory failure with hypoxia [facility])    Allergy    Basal cell carcinoma (BCC) of skin of nose 03/11/2021   Chronic kidney disease    COVID-19    Diabetes mellitus without complication (HCC) 2012   Fatty liver    Hyperlipidemia    Hypertension 2012   Pancreatitis    Pneumonia due to COVID-19 virus 04/24/2020   Prostatitis 02/08/2018     No Known Allergies  Current Outpatient Medications on File Prior to Visit  Medication Sig   APPLE CIDER VINEGAR PO Take 1 capsule by mouth in the morning and at bedtime.   Blood Glucose Monitoring Suppl (CONTOUR NEXT MONITOR) w/Device KIT Check blood sugar once daily   Budeson-Glycopyrrol-Formoterol (BREZTRI  AEROSPHERE) 160-9-4.8 MCG/ACT AERO Inhale 2 puffs into the lungs in the morning and at bedtime.   Cholecalciferol  (VITAMIN D3) 1.25 MG (50000 UT) CAPS TAKE 1 CAP BY MOUTH ONE TIME PER WEEK   Cinnamon 500 MG capsule Take 500 mg by mouth 2 (two) times daily.    fluticasone  (FLONASE ) 50 MCG/ACT nasal spray SPRAY 2 SPRAYS INTO EACH NOSTRIL EVERY DAY   glucose blood (CONTOUR NEXT TEST) test strip Check blood sugar once daily   Lancets Micro Thin 33G MISC Check blood sugar once daily   lisinopril  (ZESTRIL ) 5  MG tablet TAKE 1 TABLET BY MOUTH EVERY DAY FOR BLOOD PRESSURE   metFORMIN  (GLUCOPHAGE -XR) 500 MG 24 hr tablet Take 1 tablet (500 mg total) by mouth daily with breakfast.   Multiple Vitamins-Minerals (MULTIVITAMIN MEN 50+) TABS Take by mouth daily.   rosuvastatin  (CRESTOR ) 5 MG tablet Take 1 tab 4 days a week for cholesterol (MWFSat)   Semaglutide , 1 MG/DOSE, (OZEMPIC , 1 MG/DOSE,) 4 MG/3ML SOPN INJECT 1  MG (0.75 ML) INTO SKIN EVERY 7 DAYS FOR DIABETES ( DX: E11.29 )   tamsulosin  (FLOMAX ) 0.4 MG CAPS capsule Take  1 capsule  Daily for  Prostate                                                       /                                                                   TAKE                                         BY                                                 MOUTH   traZODone  (DESYREL ) 100 MG tablet Take   1 tablet   at Bedtime   as needed for   Sleep                                                                         /                                                                   TAKE                                         BY                                                 MOUTH   zinc  gluconate 50 MG tablet    albuterol  (VENTOLIN  HFA) 108 (90 Base) MCG/ACT inhaler Inhale 1-2 puffs into the lungs every 4 (four) hours  as needed for wheezing or shortness of breath. (Patient not taking: Reported on 09/22/2023)   aspirin  EC 81 MG tablet Take 81 mg by mouth in the morning. Every 3rd day (Patient not taking: Reported on 09/22/2023)   fexofenadine  (ALLEGRA ) 180 MG tablet Take 1 tablet (180 mg total) by mouth daily.   No current facility-administered medications on file prior to visit.    ROS: all negative except what is noted in the HPI.   Physical Exam:  BP 110/78   Pulse 74   Temp 98.4 F (36.9 C)   Ht 6' 2 (1.88 m)   Wt 211 lb (95.7 kg)   SpO2 99%   BMI 27.09 kg/m   General Appearance: NAD.  Awake, conversant and cooperative. Eyes: PERRLA, EOMs intact.  Sclera white.  Conjunctiva without erythema. Sinuses: No frontal/maxillary tenderness.  No nasal discharge. Nares patent.  ENT/Mouth: Ext aud canals clear.  Bilateral TMs w/DOL and without erythema or bulging. Hearing intact.  Posterior pharynx without swelling or exudate.  Tonsils without swelling or erythema.  Neck: Supple.  No masses, nodules or thyromegaly. Respiratory: Effort is regular with non-labored breathing. Breath sounds  are equal bilaterally without rales, rhonchi, wheezing or stridor.  Cardio: RRR with no MRGs. Brisk peripheral pulses without edema.  Abdomen: Active BS in all four quadrants.  Approximately 10 mm size round umbilical hernia, not reducible, tender to palpation.  Lymphatics: Non tender without lymphadenopathy.  Musculoskeletal: Full ROM, 5/5 strength, normal ambulation.  No clubbing or cyanosis. Skin: Appropriate color for ethnicity. Warm without rashes, lesions, ecchymosis, ulcers.  Neuro: CN II-XII grossly normal. Normal muscle tone without cerebellar symptoms and intact sensation.   Psych: AO X 3,  appropriate mood and affect, insight and judgment.     BASCOM NECESSARY, NP 9:55 AM [facility]

## 2023-09-24 NOTE — Patient Instructions (Signed)
 Umbilical Hernia, Adult  A hernia is a lump of tissue that pushes through an opening in the muscles. An umbilical hernia happens in the belly, near the belly button. The hernia may contain tissues from the small or large intestine. It may also have fatty tissue that covers the intestines. Umbilical hernias in adults may get worse over time. They need to be treated with surgery. There are several types of umbilical hernias. They include: Indirect hernia. This occurs just above or below the belly button. It's the most common type of umbilical hernia in adults. Direct hernia. This type occurs in an opening that's formed by the belly button. Reducible hernia. This hernia comes and goes. You may see it only when you strain, cough, or lift something heavy. This type of hernia can be pushed back into the belly (reduced). Incarcerated hernia. This traps the hernia in the wall of the belly. This type of hernia can't be pushed back into the belly. It can cause a strangulated hernia. Strangulated hernia. This hernia cuts off blood flow to the tissues inside the hernia. The tissues can die if this happens. This type of hernia must be treated right away. What are the causes? An umbilical hernia happens when tissue inside the belly pushes through an opening in the muscles of the belly. What increases the risk? You're more likely to get this hernia if: You strain while lifting or pushing heavy objects. You've had several pregnancies. You have a condition that puts pressure on your belly, and you've had it for a long time. These include: Obesity. A buildup of fluid inside your belly. Vomiting or coughing all the time. Trouble pooping (constipation). You've had surgery that weakened the muscles in the belly. What are the signs or symptoms? The main symptom of this condition is a bulge at the belly button or near it. The bulge does not cause pain. Other symptoms depend on the type of hernia you have. A  reducible hernia may be seen only when you strain, cough, or lift something heavy. Other symptoms may include: Dull pain. A feeling of pressure. An incarcerated hernia may cause very bad pain. Also, you may: Vomit or feel like you may vomit. Not be able to pass gas. A strangulated hernia may cause: Pain that gets worse and worse. Vomiting, or feeling like you may vomit. Pain when you press on the hernia. Change of color on the skin over the hernia. The skin may become red or purple. Trouble pooping. Blood in the poop. How is this diagnosed? This condition may be diagnosed based on: Your symptoms and medical history. A physical exam. You may be asked to cough or strain while standing. These actions will put pressure inside your belly. The pressure can force the hernia through the opening in your muscles. Your health care provider may try to push the hernia back into your belly (reduce). How is this treated? Surgery is the only treatment for an umbilical hernia. Surgery for a strangulated hernia must be done right away. If you have a small hernia that's not incarcerated, you may need to lose weight before the surgery is done. Follow these instructions at home: Managing constipation You may need to take these actions to prevent trouble pooping. This will help to prevent straining. Drink enough fluid to keep your pee (urine) pale yellow. Take over-the-counter or prescription medicines. Eat foods that are high in fiber, such as beans, whole grains, and fresh fruits and vegetables. Limit foods that are high in  fat and sugars, such as fried or sweet foods. General instructions Do not try to push the hernia back in. Lose weight, if told by your provider. Watch your hernia for any changes in color or size. Tell your provider if any changes occur. You may need to avoid activities that put pressure on your hernia. You may have to avoid lifting. Ask your provider how much you can safely  lift. Take over-the-counter and prescription medicines only as told by your provider. Contact a health care provider if: Your hernia gets larger or feels hard. Your hernia becomes painful. You get a fever or chills. Get help right away if: You get very bad pain near the area of the hernia, and the pain comes on suddenly. You have pain and you vomit or feel like you may vomit. The skin over your hernia changes color. These symptoms may be an emergency. Get help right away. Call 911. Do not wait to see if the symptoms go away. Do not drive yourself to the hospital. This information is not intended to replace advice given to you by your health care provider. Make sure you discuss any questions you have with your health care provider. Document Revised: 12/27/2022 Document Reviewed: 12/27/2022 Elsevier Patient Education  2024 [employer].

## 2023-09-28 ENCOUNTER — Other Ambulatory Visit: Payer: Self-pay | Admitting: Nurse Practitioner

## 2023-09-28 DIAGNOSIS — E1122 Type 2 diabetes mellitus with diabetic chronic kidney disease: Secondary | ICD-10-CM

## 2023-10-01 ENCOUNTER — Other Ambulatory Visit: Payer: Self-pay | Admitting: Internal Medicine

## 2023-10-01 DIAGNOSIS — E1122 Type 2 diabetes mellitus with diabetic chronic kidney disease: Secondary | ICD-10-CM

## 2023-10-01 MED ORDER — METFORMIN HCL ER 500 MG PO TB24
ORAL_TABLET | ORAL | 3 refills | Status: AC
Start: 2023-10-01 — End: ?

## 2023-10-01 MED ORDER — PROMETHAZINE-DM 6.25-15 MG/5ML PO SYRP: ORAL_SOLUTION | ORAL | 1 refills | Status: AC

## 2023-10-04 ENCOUNTER — Other Ambulatory Visit: Payer: Self-pay | Admitting: Internal Medicine

## 2023-10-04 DIAGNOSIS — J042 Acute laryngotracheitis: Secondary | ICD-10-CM

## 2023-10-04 DIAGNOSIS — J45901 Unspecified asthma with (acute) exacerbation: Secondary | ICD-10-CM

## 2023-10-04 DIAGNOSIS — J0140 Acute pansinusitis, unspecified: Secondary | ICD-10-CM

## 2023-10-04 MED ORDER — PSEUDOEPHEDRINE HCL ER 120 MG PO TB12
ORAL_TABLET | ORAL | 2 refills | Status: AC
Start: 2023-10-04 — End: ?

## 2023-10-04 MED ORDER — DOXYCYCLINE HYCLATE 100 MG PO CAPS
ORAL_CAPSULE | ORAL | 0 refills | Status: AC
Start: 2023-10-04 — End: ?

## 2023-10-04 MED ORDER — BENZONATATE 200 MG PO CAPS
ORAL_CAPSULE | ORAL | 1 refills | Status: AC
Start: 2023-10-04 — End: ?

## 2023-10-04 NOTE — Progress Notes (Unsigned)
 [facility]      ADULT   &   ADOLESCENT      INTERNAL MEDICINE  Vangie Genet, M.D.          Marilyn Shropshire, ANP        Tonya Cranford, FNP  [facility] [address]  [place], [place]. [address] Telephone [phone] Telefax [phone]   Future Appointments  Date Time Provider Department Center  10/09/2023  2:00 PM Wilkinson, Dana E, NP [facility] None    History of Present Illness:        Current Outpatient Medications on File Prior to Visit  Medication Sig   albuterol  (VENTOLIN  HFA) 108 (90 Base) MCG/ACT inhaler Inhale 1-2 puffs into the lungs every 4 (four) hours as needed for wheezing or shortness of breath. (Patient not taking: Reported on 09/22/2023)   APPLE CIDER VINEGAR PO Take 1 capsule by mouth in the morning and at bedtime.   aspirin  EC 81 MG tablet Take 81 mg by mouth in the morning. Every 3rd day (Patient not taking: Reported on 09/22/2023)   Blood Glucose Monitoring Suppl (CONTOUR NEXT MONITOR) w/Device KIT Check blood sugar once daily   Budeson-Glycopyrrol-Formoterol (BREZTRI  AEROSPHERE) 160-9-4.8 MCG/ACT AERO Inhale 2 puffs into the lungs in the morning and at bedtime.   Cholecalciferol  (VITAMIN D3) 1.25 MG (50000 UT) CAPS TAKE 1 CAP BY MOUTH ONE TIME PER WEEK   Cinnamon 500 MG capsule Take 500 mg by mouth 2 (two) times daily.    fluticasone  (FLONASE ) 50 MCG/ACT nasal spray SPRAY 2 SPRAYS INTO EACH NOSTRIL EVERY DAY   glucose blood (CONTOUR NEXT TEST) test strip Check blood sugar once daily   Lancets Micro Thin 33G MISC Check blood sugar once daily   lisinopril  (ZESTRIL ) 5 MG tablet TAKE 1 TABLET BY MOUTH EVERY DAY FOR BLOOD PRESSURE   metFORMIN  (GLUCOPHAGE -XR) 500 MG 24 hr tablet Take 2 tablets with Food 2 x / day with Meals for Diabetes           /       TAKE      BY      MOUTH   Multiple Vitamins-Minerals (MULTIVITAMIN MEN 50+) TABS Take by mouth daily.   promethazine -dextromethorphan  (PROMETHAZINE -DM) 6.25-15 MG/5ML syrup Take 1  tsp every 4 hours  as needed for cough                        /       TAKE      BY      MOUTH   rosuvastatin  (CRESTOR ) 5 MG tablet Take 1 tab 4 days a week for cholesterol (MWFSat)   Semaglutide , 1 MG/DOSE, (OZEMPIC , 1 MG/DOSE,) 4 MG/3ML SOPN INJECT 1 MG (0.75 ML) INTO SKIN EVERY 7 DAYS FOR DIABETES ( DX: E11.29 )   tamsulosin  (FLOMAX ) 0.4 MG CAPS capsule Take  1 capsule  Daily for  Prostate                                                       /  TAKE                                         BY                                                 MOUTH   traZODone  (DESYREL ) 100 MG tablet Take   1 tablet   at Bedtime   as needed for   Sleep                                                                         /                                                                   TAKE                                         BY                                                 MOUTH   zinc  gluconate 50 MG tablet    No current facility-administered medications on file prior to visit.    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   Observations/Objective:  There were  no vitals taken for this visit.  HEENT - WNL. Neck - supple.  Chest - Clear equal BS. Cor - Nl HS. RRR w/o sig MGR. PP 1(+). No edema. MS- FROM w/o deformities.  Gait Nl. Neuro -  Nl w/o focal abnormalities.   Assessment and Plan:   1. Asthmatic bronchitis with acute exacerbation, unspecified asthma severity, unspecified whether persistent (Primary)  - doxycycline  (VIBRAMYCIN ) 100 MG capsule; Take 1 capsule 2 x /day with meals for Infection  Dispense: 30 capsule; Refill: 0 - pseudoephedrine  (SUDAFED) 120 MG 12 hr tablet; Take   1 tablet    2 x /day (every 12 hours)  for Sinus & Chest Congestion  Dispense: 20 tablet; Refill: 2  2. Acute non-recurrent pansinusitis  - doxycycline  (VIBRAMYCIN ) 100 MG capsule; Take 1 capsule 2 x /day with meals for Infection  Dispense: 30 capsule; Refill: 0 - pseudoephedrine  (SUDAFED) 120 MG 12 hr tablet; Take   1 tablet    2 x /day (every 12 hours)    for Sinus & Chest Congestion  Dispense: 20 tablet; Refill: 2  3. Acute tracheitis with laryngitis  - benzonatate  (TESSALON ) 200 MG capsule; Take 1 perle 3 x / day to prevent cough  Dispense: 30 capsule; Refill: 1      Follow Up Instructions:        I discussed the assessment and treatment plan with the patient. The patient was provided an opportunity to ask questions and all were answered. The patient agreed with the plan and demonstrated an understanding of the instructions.       The patient was advised to call back or seek an in-person evaluation if the symptoms worsen or if the condition fails to improve as anticipated.    Shila Door, MD

## 2023-10-08 ENCOUNTER — Other Ambulatory Visit: Payer: Self-pay | Admitting: Nurse Practitioner

## 2023-10-08 DIAGNOSIS — J302 Other seasonal allergic rhinitis: Secondary | ICD-10-CM

## 2023-10-08 NOTE — Progress Notes (Deleted)
COMPLETE PHYSICAL  Assessment and Plan:   Gregory Jacobs was seen today for annual exam.  Diagnoses and all orders for this visit:  Encounter for Annual Physical Exam with abnormal findings Due annually  Health Maintenance reviewed Healthy lifestyle reviewed and goals set Declines vaccines  Atherosclerosis of aorta (HCC) - CT 05/2020 Control blood pressure, cholesterol, glucose, increase exercise.   2 vessel CAD Discussed recommendation for cardiology evaluation vs CT coronary calcium at some point to determine degree; cannot determine this on non-cardiac imaging Denies sx; has numerous specialist appointments and CTs this year Will hold off, newly on rosuvastatin, continue ASA, aggressive lifestyle interventions He is receptive to CT coronary calcium next year Go to the ER if any chest pain, shortness of breath, nausea, dizziness, severe HA, changes vision/speech  Essential hypertension Continue low dose lisinopril 5 mg  Monitor blood pressure at home; call if consistently over 130/80 Continue DASH diet.   Reminder to go to the ER if any CP, SOB, nausea, dizziness, severe HA, changes vision/speech, left arm numbness and tingling and jaw pain. -     CBC with Differential/Platelet -     COMPLETE METABOLIC PANEL WITH GFR -     TSH -     Magnesium  Hyperlipidemia associated with type 2 diabetes mellitus (HCC) LDL goal <70; now on rosuvastatin  Discussed dietary and exercise modifications Low fat diet -     Lipid panel  Type 2 diabetes mellitus with stage 2 chronic kidney disease, without long-term current use of insulin (HCC) Continue medications: Metformin 500mg  two tablets BID.  Currently Ozempic is on hold- will restart if amylase and lipase are normal Discussed general issues about diabetes pathophysiology and management. Education: Reviewed 'ABCs' of diabetes management (respective goals in parentheses):  A1C (<7), blood pressure (<130/80), and cholesterol (LDL <70) Dietary  recommendations Encouraged aerobic exercise.  Discussed foot care, check daily Yearly retinal exam - he has scheduled, requested report Dental exam every 6 months Monitor blood glucose, discussed goal for patient -     Hemoglobin A1c  Stage 2 chronic kidney disease due to type 2 diabetes mellitus (HCC) Increase fluids, avoid NSAIDS, monitor sugars, will monitor  - CMP/GFR< UA, microalbumin    Vitamin D deficiency Continue supplementation to maintain goal of 70-100 Reduced to taking Vitamin D 50,000IU once a week  Check vitamin D level  Overweight (BMI 25.0-29.9)  Discussed dietary and exercise modifications Encouraged healthy behaviors, continue lifestyle modifications  Medication management Continued  Screening for ischemic heart disease - EKG  Screening for AAA - U/S ABD Retroperitoneal LTD  Screening for blood or protein in urine -     Urinalysis w reflex microscopic -     Microalbumin / creatinine urine ratio  Prostate nodule  -[facility] following  Screening PSA (prostate specific antigen) -     PSA - checked per patient preference  Hypogonadism - Testosterone, total    Hx of Pancreatitis -amylase and Lipase - If labs have returned to normal restart Ozempic Continue to follow with Dr Gregory Jacobs  Neck pain - CXR of cervical spine If negative will refer to ortho for further evaluation   Further disposition pending results if labs check today. Discussed med's effects and SE's.   Over 40 minutes of face to face interview, exam, counseling, chart review, and critical decision making was performed.    Future Appointments  Date Time Provider Department Center  10/09/2023  2:00 PM Gregory Dick, NP [facility] None  10/14/2024  9:00 AM  Gregory Dick, NP [facility] None     ----------------------------------------------------------------------------------------------------------------------  HPI 53 y.o. male  presents for complete physical and  follow up. He has Essential hypertension; Hyperlipidemia associated with type 2 diabetes mellitus (HCC); Type 2 diabetes mellitus with kidney complication (HCC); Vitamin D deficiency; NAFLD (nonalcoholic fatty liver disease); Overweight (BMI 25.0-29.9); Prostate nodule; Stage 2 chronic kidney disease due to type 2 diabetes mellitus (HCC); Pityriasis rosea; Abnormal EKG; Aortic atherosclerosis (HCC) - by Chest CT scan -  05/2020; Personal history of covid-19 (04/16/2020); Hepatomegaly; Shift work sleep disorder; Abnormal liver CT-question cirrhosis; Pancreatic lesion-diminutive hypodensity stable times years; 2-vessel coronary artery disease; Metabolic syndrome; History of basal cell carcinoma (BCC); Onychomycosis of left great toe; Hyperbilirubinemia; and Acute gallstone pancreatitis on their problem list.  He is married, works as [employer], working third shift. He has grown son in the [employer]. Married with step kids.   Recurrent covid 19 pneumonia, had significant pneumonia, respiratory failure with admission with residual fibrosis, He did see pulmonology, had PFTs, was recommended regular exercise. Improved some with increased walking.  He had fatty liver with elevated LFTs, recent External CT abd/pelvis w & w/o contrast 09/28/2020 showed prominent caudate lob and slight lobularity of the lateral segment left hepatic lob suspicious for early cirrhosis. Follow up MRI 06/2022 showed no liver lesion. He was admitted for pancreatitis 06/25/22. Was further evaluated by Gregory Europe NP 08/31/22- plan was for EGD but Dr. Leone Jacobs does not believe pt has cirrhosis and has decided to defer EGD. Last lipase 09/13/22 was still mildly elevated, has been holding Ozempic and plan to recheck today. He is having an aching sensation in his left upper abdomen which is intermittent. Bowel movements are normal  He continues to have numbness and tingling sensation in his right shoulder and chest. Lung exams have all been normal.  Will  get cervical spine xray to check for arthritis or possible narrowing of spinal column  He had recent removal of BCC right supraclavicular and biopsy of right posterior tibia which revealed dysplastic compound nevus with severe atypia.   He has been prescribed armodafinil PRN due to daytime fatigue/shift work in the past, takes rarely, reports still has nearly full bottle.   BMI is There is no height or weight on file to calculate BMI., he has been working on diet and exercise. Reports has cut out soda (does diet only), doing lots of [place] wraps, eggs and fruit, some atkins bars while at work. Does sugar free flavors in water.  Wt Readings from Last 3 Encounters:  09/22/23 211 lb (95.7 kg)  08/07/23 212 lb 3.2 oz (96.3 kg)  04/24/23 205 lb 6.4 oz (93.2 kg)   CT 05/2020 showed aortic atherosclerosis, 2 vessel CAD, was initiated on rosuvastatin.   His blood pressure has been controlled at home (120's/70's etc), today their BP is    BP Readings from Last 3 Encounters:  09/22/23 110/78  08/07/23 122/78  04/24/23 124/74   He does not workout but work physically active job. He denies chest pain, shortness of breath, dizziness.   He is on cholesterol medication (rosuvastatin 5 mg three days a week) and denies myalgias. His cholesterol is not at goal. The cholesterol last visit was:   Lab Results  Component Value Date   CHOL 136 08/07/2023   HDL 43 08/07/2023   LDLCALC 66 08/07/2023   TRIG 208 (H) 08/07/2023   CHOLHDL 3.2 08/07/2023    He has been working on diet and exercise for DMII  on metformin (2000 mg), and denies foot ulcerations, increased appetite, nausea, paresthesia of the feet, polydipsia, polyuria, visual disturbances, vomiting and weight loss.  He is currently off Ozempic to determine if this was elevating his lipase.  Last A1C in the office was:  Lab Results  Component Value Date   HGBA1C 5.6 08/07/2023   Lab Results  Component Value Date   EGFR 83 08/07/2023    Patient  is on Vitamin D supplement, has reduced to 50000 IU once a month, down from 3/week  Lab Results  Component Value Date   VD25OH 64 10/03/2022     He follows annually with [facility] Dr. Alvester Morin, last OV 10/02/2020, prostate nodule (neg biopsy 2019) and bloody ejaculate. Had PSA but requesting recheck today with testosterone, having more fatigue.  Lab Results  Component Value Date   PSA 0.37 08/04/2021   PSA 0.42 08/04/2020   PSA 2.4 02/09/2018    Lab Results  Component Value Date   TESTOSTERONE 489 08/07/2023       Current Medications:  Current Outpatient Medications on File Prior to Visit  Medication Sig   APPLE CIDER VINEGAR PO Take 1 capsule by mouth in the morning and at bedtime.   benzonatate (TESSALON) 200 MG capsule Take 1 perle 3 x / day to prevent cough   Blood Glucose Monitoring Suppl (CONTOUR NEXT MONITOR) w/Device KIT Check blood sugar once daily   Budeson-Glycopyrrol-Formoterol (BREZTRI AEROSPHERE) 160-9-4.8 MCG/ACT AERO Inhale 2 puffs into the lungs in the morning and at bedtime.   Cholecalciferol (VITAMIN D3) 1.25 MG (50000 UT) CAPS TAKE 1 CAP BY MOUTH ONE TIME PER WEEK   Cinnamon 500 MG capsule Take 500 mg by mouth 2 (two) times daily.    doxycycline (VIBRAMYCIN) 100 MG capsule Take 1 capsule 2 x /day with meals for Infection   fluticasone (FLONASE) 50 MCG/ACT nasal spray SPRAY 2 SPRAYS INTO EACH NOSTRIL EVERY DAY   glucose blood (CONTOUR NEXT TEST) test strip Check blood sugar once daily   Lancets Micro Thin 33G MISC Check blood sugar once daily   lisinopril (ZESTRIL) 5 MG tablet TAKE 1 TABLET BY MOUTH EVERY DAY FOR BLOOD PRESSURE   metFORMIN (GLUCOPHAGE-XR) 500 MG 24 hr tablet Take 2 tablets with Food 2 x / day with Meals for Diabetes           /       TAKE      BY      MOUTH   Multiple Vitamins-Minerals (MULTIVITAMIN MEN 50+) TABS Take by mouth daily.   promethazine-dextromethorphan (PROMETHAZINE-DM) 6.25-15 MG/5ML syrup Take 1 tsp every 4 hours  as needed for  cough                        /       TAKE      BY      MOUTH   pseudoephedrine (SUDAFED) 120 MG 12 hr tablet Take   1 tablet    2 x /day (every 12 hours)    for Sinus & Chest Congestion   rosuvastatin (CRESTOR) 5 MG tablet Take 1 tab 4 days a week for cholesterol (MWFSat)   Semaglutide, 1 MG/DOSE, (OZEMPIC, 1 MG/DOSE,) 4 MG/3ML SOPN INJECT 1 MG (0.75 ML) INTO SKIN EVERY 7 DAYS FOR DIABETES ( DX: E11.29 )   tamsulosin (FLOMAX) 0.4 MG CAPS capsule Take  1 capsule  Daily for  Prostate                                                       /  TAKE                                         BY                                                 MOUTH   traZODone (DESYREL) 100 MG tablet Take   1 tablet   at Bedtime   as needed for   Sleep                                                                         /                                                                   TAKE                                         BY                                                 MOUTH   zinc gluconate 50 MG tablet    No current facility-administered medications on file prior to visit.     Allergies: No Known Allergies   Medical History:  Past Medical History:  Diagnosis Date   2-vessel coronary artery disease 11/20/2020   Acute respiratory failure with hypoxia (HCC)    Allergy    Basal cell carcinoma (BCC) of skin of nose 03/11/2021   Chronic kidney disease    COVID-19    Diabetes mellitus without complication (HCC) 2012   Fatty liver    Hyperlipidemia    Hypertension 2012   Pancreatitis    Pneumonia due to COVID-19 virus 04/24/2020   Prostatitis 02/08/2018   Immunization History  Administered Date(s) Administered   Hep A / Hep B 12/28/2020, 02/05/2021   Hepatitis A, Adult 07/05/2021   Hepb-cpg 07/05/2021   Pneumococcal Polysaccharide-23 07/11/2018   Td 05/28/2013   Tdap 01/21/2021   TD or Tdap: 2014  Influenza: Declines  Pneumococcal:  2019 Prevnar13: Due at 64 Shingrix: at age 65  Covid 32: declines   Last colonoscopy: 01/18/2021, 10 year recall, Dr. Leone Jacobs  Last eye: 2021, my eye doctor, has scheduled, will request report Last dental: [facility], 2022, goes q23m Last derm: 07/2021   Surgical History:  He  has a past surgical history that includes Cholecystectomy, laparoscopic (2011) and Vasectomy (2016). Family History:  Hisfamily history includes Breast cancer in his mother; Breast cancer (age of onset: 2) in his maternal aunt; Diabetes  in his maternal aunt and paternal uncle; Heart attack (age of onset: 51) in his father; Hypertension in his father; Suicidality (age of onset: 34) in his sister. Social History:   reports that he quit smoking about 10 years ago. His smoking use included cigarettes. He started smoking about 29 years ago. He has a 9.5 pack-year smoking history. His smokeless tobacco use includes chew. He reports current alcohol use. He reports that he does not use drugs.   Review of Systems:  Review of Systems  Constitutional:  Negative for malaise/fatigue and weight loss.  HENT:  Negative for hearing loss and tinnitus.   Eyes:  Negative for blurred vision and double vision.  Respiratory:  Negative for cough, sputum production, shortness of breath and wheezing.   Cardiovascular:  Negative for chest pain, palpitations, orthopnea, claudication, leg swelling and PND.  Gastrointestinal:  Positive for abdominal pain (intermittent LUQ pain- dull ache). Negative for blood in stool, constipation, diarrhea, heartburn, melena, nausea and vomiting.       Intermittent LUQ abdominal pain  Genitourinary: Negative.   Musculoskeletal:  Positive for back pain and neck pain. Negative for falls, joint pain and myalgias.  Skin:  Negative for rash.       Removal of BCC right supraclavicular and dysplastic compound nevus with severe atypia right posterior tibia  Neurological:  Positive for tingling (right shoulder  and upper chest). Negative for dizziness, sensory change, weakness and headaches.  Endo/Heme/Allergies:  Negative for polydipsia.  Psychiatric/Behavioral: Negative.  Negative for depression, memory loss, substance abuse and suicidal ideas. The patient is not nervous/anxious and does not have insomnia.   All other systems reviewed and are negative.   Physical Exam: There were no vitals taken for this visit. Wt Readings from Last 3 Encounters:  09/22/23 211 lb (95.7 kg)  08/07/23 212 lb 3.2 oz (96.3 kg)  04/24/23 205 lb 6.4 oz (93.2 kg)   General Appearance: Well nourished adults male, well developed, in no apparent distress. Eyes: PERRLA, conjunctiva no swelling or erythema Sinuses: No Frontal/maxillary tenderness ENT/Mouth: Ext aud canals clear, TMs without erythema, bulging. Post pharynx without swelling, or exudate on post pharynx.  Tonsils not swollen or erythematous. Hearing normal.  Neck: Supple Respiratory: Respiratory effort normal, BS equal bilaterally without rales, rhonchi, wheezing or stridor.  Cardio: RRR with no MRGs. Brisk peripheral pulses without edema.  Abdomen: Soft, + BS.  Non tender, no rebound, no palpable organomegaly.  Lymphatics: Non tender without lymphadenopathy.  Musculoskeletal: Full ROM, 5/5 strength, Normal gait Skin: Warm, dry. Biopsy areas are healing well, no s/s of infection Neuro: Cranial nerves intact. No cerebellar symptoms. Distal sensation intact by monofilament  Psych: Awake and oriented X 3, normal affect, Insight and Judgment appropriate.  GU: defer to urology   EKG: NSR, no ST changes AAA: < 3 cm  [employer] Hollie Salk, NP 2:45 PM [facility]

## 2023-10-09 ENCOUNTER — Encounter: Payer: 59 | Admitting: Nurse Practitioner

## 2023-10-09 DIAGNOSIS — Z125 Encounter for screening for malignant neoplasm of prostate: Secondary | ICD-10-CM

## 2023-10-09 DIAGNOSIS — E291 Testicular hypofunction: Secondary | ICD-10-CM

## 2023-10-09 DIAGNOSIS — I700 Atherosclerosis of aorta: Secondary | ICD-10-CM

## 2023-10-09 DIAGNOSIS — Z1389 Encounter for screening for other disorder: Secondary | ICD-10-CM

## 2023-10-09 DIAGNOSIS — Z0001 Encounter for general adult medical examination with abnormal findings: Secondary | ICD-10-CM

## 2023-10-09 DIAGNOSIS — E1169 Type 2 diabetes mellitus with other specified complication: Secondary | ICD-10-CM

## 2023-10-09 DIAGNOSIS — E663 Overweight: Secondary | ICD-10-CM

## 2023-10-09 DIAGNOSIS — I2510 Atherosclerotic heart disease of native coronary artery without angina pectoris: Secondary | ICD-10-CM

## 2023-10-09 DIAGNOSIS — Z79899 Other long term (current) drug therapy: Secondary | ICD-10-CM

## 2023-10-09 DIAGNOSIS — Z136 Encounter for screening for cardiovascular disorders: Secondary | ICD-10-CM

## 2023-10-09 DIAGNOSIS — E559 Vitamin D deficiency, unspecified: Secondary | ICD-10-CM

## 2023-10-09 DIAGNOSIS — E1122 Type 2 diabetes mellitus with diabetic chronic kidney disease: Secondary | ICD-10-CM

## 2023-10-09 DIAGNOSIS — Z1329 Encounter for screening for other suspected endocrine disorder: Secondary | ICD-10-CM

## 2023-10-09 DIAGNOSIS — Z8719 Personal history of other diseases of the digestive system: Secondary | ICD-10-CM

## 2023-10-09 DIAGNOSIS — I10 Essential (primary) hypertension: Secondary | ICD-10-CM

## 2023-11-13 ENCOUNTER — Ambulatory Visit: Payer: 59 | Admitting: Nurse Practitioner

## 2024-04-10 ENCOUNTER — Other Ambulatory Visit: Payer: Self-pay | Admitting: Nurse Practitioner

## 2024-04-10 DIAGNOSIS — I10 Essential (primary) hypertension: Secondary | ICD-10-CM

## 2024-10-14 ENCOUNTER — Encounter: Payer: 59 | Admitting: Nurse Practitioner
